# Patient Record
Sex: Female | Born: 1935 | Race: White | Hispanic: No | Smoking: Former smoker
Health system: Southern US, Community
[De-identification: ages and names within clinical notes are randomized; demographics above are authoritative.]

## PROBLEM LIST (undated history)

## (undated) DIAGNOSIS — I10 Essential (primary) hypertension: Secondary | ICD-10-CM

## (undated) DIAGNOSIS — F0390 Unspecified dementia without behavioral disturbance: Secondary | ICD-10-CM

## (undated) DIAGNOSIS — H5440 Blindness, one eye, unspecified eye: Secondary | ICD-10-CM

## (undated) DIAGNOSIS — C801 Malignant (primary) neoplasm, unspecified: Secondary | ICD-10-CM

## (undated) DIAGNOSIS — H409 Unspecified glaucoma: Secondary | ICD-10-CM

## (undated) DIAGNOSIS — I639 Cerebral infarction, unspecified: Secondary | ICD-10-CM

## (undated) HISTORY — DX: Cerebral infarction, unspecified: I63.9

## (undated) HISTORY — DX: Essential (primary) hypertension: I10

## (undated) HISTORY — PX: APPENDECTOMY: SHX54

## (undated) HISTORY — DX: Malignant (primary) neoplasm, unspecified: C80.1

## (undated) HISTORY — DX: Blindness, one eye, unspecified eye: H54.40

## (undated) HISTORY — PX: ABDOMINAL HYSTERECTOMY: SHX81

## (undated) HISTORY — PX: EYE SURGERY: SHX253

## (undated) HISTORY — DX: Unspecified glaucoma: H40.9

---

## 2004-05-07 ENCOUNTER — Inpatient Hospital Stay: Payer: Self-pay | Admitting: Surgery

## 2004-05-07 ENCOUNTER — Other Ambulatory Visit: Payer: Self-pay

## 2004-05-24 ENCOUNTER — Ambulatory Visit: Payer: Self-pay | Admitting: Surgery

## 2004-12-27 ENCOUNTER — Ambulatory Visit: Payer: Self-pay | Admitting: Internal Medicine

## 2005-10-01 ENCOUNTER — Ambulatory Visit: Payer: Self-pay | Admitting: Orthopaedic Surgery

## 2006-01-07 ENCOUNTER — Ambulatory Visit: Payer: Self-pay | Admitting: Internal Medicine

## 2006-04-11 ENCOUNTER — Ambulatory Visit: Payer: Self-pay | Admitting: Ophthalmology

## 2006-04-23 ENCOUNTER — Ambulatory Visit: Payer: Self-pay | Admitting: Ophthalmology

## 2006-05-28 ENCOUNTER — Ambulatory Visit: Payer: Self-pay | Admitting: Surgery

## 2006-05-28 ENCOUNTER — Other Ambulatory Visit: Payer: Self-pay

## 2006-06-05 ENCOUNTER — Ambulatory Visit: Payer: Self-pay | Admitting: Surgery

## 2007-01-09 ENCOUNTER — Ambulatory Visit: Payer: Self-pay | Admitting: Internal Medicine

## 2007-09-19 ENCOUNTER — Other Ambulatory Visit: Payer: Self-pay

## 2007-09-19 ENCOUNTER — Emergency Department: Payer: Self-pay | Admitting: Emergency Medicine

## 2007-10-19 ENCOUNTER — Emergency Department: Payer: Self-pay | Admitting: Emergency Medicine

## 2007-10-19 ENCOUNTER — Other Ambulatory Visit: Payer: Self-pay

## 2008-01-11 ENCOUNTER — Ambulatory Visit: Payer: Self-pay | Admitting: Internal Medicine

## 2008-04-16 ENCOUNTER — Other Ambulatory Visit: Payer: Self-pay

## 2008-04-16 ENCOUNTER — Observation Stay: Payer: Self-pay | Admitting: Unknown Physician Specialty

## 2008-06-15 ENCOUNTER — Ambulatory Visit: Payer: Self-pay | Admitting: Ophthalmology

## 2008-06-22 ENCOUNTER — Ambulatory Visit: Payer: Self-pay | Admitting: Ophthalmology

## 2009-01-12 ENCOUNTER — Ambulatory Visit: Payer: Self-pay | Admitting: Internal Medicine

## 2009-05-24 ENCOUNTER — Ambulatory Visit: Payer: Self-pay | Admitting: Unknown Physician Specialty

## 2010-01-09 ENCOUNTER — Ambulatory Visit: Payer: Self-pay | Admitting: Ophthalmology

## 2010-01-15 ENCOUNTER — Ambulatory Visit: Payer: Self-pay | Admitting: Ophthalmology

## 2010-02-02 ENCOUNTER — Ambulatory Visit: Payer: Self-pay | Admitting: Ophthalmology

## 2010-04-11 ENCOUNTER — Ambulatory Visit: Payer: Self-pay | Admitting: Internal Medicine

## 2010-08-03 ENCOUNTER — Emergency Department: Payer: Self-pay | Admitting: Emergency Medicine

## 2011-04-15 ENCOUNTER — Ambulatory Visit: Payer: Self-pay | Admitting: Internal Medicine

## 2012-01-07 ENCOUNTER — Ambulatory Visit: Payer: Self-pay | Admitting: Internal Medicine

## 2012-04-15 ENCOUNTER — Ambulatory Visit: Payer: Self-pay | Admitting: Internal Medicine

## 2013-04-16 ENCOUNTER — Ambulatory Visit: Payer: Self-pay | Admitting: Internal Medicine

## 2013-10-18 ENCOUNTER — Emergency Department: Payer: Self-pay | Admitting: Emergency Medicine

## 2013-10-18 LAB — CBC
HCT: 37.4 % (ref 35.0–47.0)
HGB: 12.4 g/dL (ref 12.0–16.0)
MCH: 30.4 pg (ref 26.0–34.0)
MCHC: 33 g/dL (ref 32.0–36.0)
MCV: 92 fL (ref 80–100)
PLATELETS: 285 10*3/uL (ref 150–440)
RBC: 4.06 10*6/uL (ref 3.80–5.20)
RDW: 13.9 % (ref 11.5–14.5)
WBC: 7 10*3/uL (ref 3.6–11.0)

## 2013-10-18 LAB — COMPREHENSIVE METABOLIC PANEL
ALBUMIN: 4 g/dL (ref 3.4–5.0)
ANION GAP: 3 — AB (ref 7–16)
Alkaline Phosphatase: 58 U/L
BILIRUBIN TOTAL: 0.7 mg/dL (ref 0.2–1.0)
BUN: 12 mg/dL (ref 7–18)
CO2: 29 mmol/L (ref 21–32)
Calcium, Total: 8.7 mg/dL (ref 8.5–10.1)
Chloride: 100 mmol/L (ref 98–107)
Creatinine: 0.83 mg/dL (ref 0.60–1.30)
EGFR (African American): >60
Glucose: 128 mg/dL — ABNORMAL HIGH (ref 65–99)
Osmolality: 266 (ref 275–301)
Potassium: 4.1 mmol/L (ref 3.5–5.1)
SGOT(AST): 29 U/L (ref 15–37)
SGPT (ALT): 21 U/L (ref 12–78)
SODIUM: 132 mmol/L — AB (ref 136–145)
TOTAL PROTEIN: 8.2 g/dL (ref 6.4–8.2)

## 2013-10-18 LAB — PROTIME-INR
INR: 1
PROTHROMBIN TIME: 13.3 s (ref 11.5–14.7)

## 2013-10-18 LAB — CK TOTAL AND CKMB (NOT AT ARMC)
CK, Total: 86 U/L
CK-MB: 0.9 ng/mL (ref 0.5–3.6)

## 2013-10-18 LAB — APTT: Activated PTT: 32.2 secs (ref 23.6–35.9)

## 2013-10-18 LAB — TROPONIN I: Troponin-I: <0.02 ng/mL

## 2014-04-18 ENCOUNTER — Ambulatory Visit: Payer: Self-pay | Admitting: Internal Medicine

## 2014-07-13 ENCOUNTER — Inpatient Hospital Stay: Payer: Self-pay | Admitting: Internal Medicine

## 2014-07-13 LAB — URINALYSIS, COMPLETE
BILIRUBIN, UR: NEGATIVE
Bacteria: NONE SEEN
Blood: NEGATIVE
Glucose,UR: NEGATIVE mg/dL (ref 0–75)
Ketone: NEGATIVE
Leukocyte Esterase: NEGATIVE
Nitrite: NEGATIVE
Ph: 7 (ref 4.5–8.0)
Protein: NEGATIVE
RBC,UR: <1 /HPF (ref 0–5)
SPECIFIC GRAVITY: 1.005 (ref 1.003–1.030)
WBC UR: 1 /HPF (ref 0–5)

## 2014-07-13 LAB — BASIC METABOLIC PANEL
Anion Gap: 9 (ref 7–16)
BUN: 13 mg/dL (ref 7–18)
CO2: 27 mmol/L (ref 21–32)
CREATININE: 0.67 mg/dL (ref 0.60–1.30)
Calcium, Total: 9.5 mg/dL (ref 8.5–10.1)
Chloride: 100 mmol/L (ref 98–107)
EGFR (African American): >60
EGFR (Non-African Amer.): >60
GLUCOSE: 87 mg/dL (ref 65–99)
OSMOLALITY: 271 (ref 275–301)
POTASSIUM: 4.2 mmol/L (ref 3.5–5.1)
SODIUM: 136 mmol/L (ref 136–145)

## 2014-07-13 LAB — CBC WITH DIFFERENTIAL/PLATELET
Basophil #: 0 10*3/uL (ref 0.0–0.1)
Basophil %: 0.9 %
Eosinophil #: 0.1 10*3/uL (ref 0.0–0.7)
Eosinophil %: 1.8 %
HCT: 38.3 % (ref 35.0–47.0)
HGB: 12.7 g/dL (ref 12.0–16.0)
LYMPHS PCT: 33.5 %
Lymphocyte #: 1.4 10*3/uL (ref 1.0–3.6)
MCH: 30.5 pg (ref 26.0–34.0)
MCHC: 33.2 g/dL (ref 32.0–36.0)
MCV: 92 fL (ref 80–100)
MONOS PCT: 12.7 %
Monocyte #: 0.5 x10 3/mm (ref 0.2–0.9)
NEUTROS PCT: 51.1 %
Neutrophil #: 2.1 10*3/uL (ref 1.4–6.5)
Platelet: 313 10*3/uL (ref 150–440)
RBC: 4.17 10*6/uL (ref 3.80–5.20)
RDW: 14.5 % (ref 11.5–14.5)
WBC: 4.1 10*3/uL (ref 3.6–11.0)

## 2014-07-13 LAB — HEPATIC FUNCTION PANEL A (ARMC)
ALBUMIN: 4 g/dL (ref 3.4–5.0)
ALT: 28 U/L
AST: 41 U/L — AB (ref 15–37)
Alkaline Phosphatase: 74 U/L
Bilirubin, Direct: 0.1 mg/dL (ref 0.0–0.2)
Bilirubin,Total: 0.9 mg/dL (ref 0.2–1.0)
Total Protein: 8.6 g/dL — ABNORMAL HIGH (ref 6.4–8.2)

## 2014-07-14 LAB — CBC WITH DIFFERENTIAL/PLATELET
BASOS ABS: 0 10*3/uL (ref 0.0–0.1)
BASOS PCT: 0.7 %
Eosinophil #: 0.1 10*3/uL (ref 0.0–0.7)
Eosinophil %: 2.7 %
HCT: 36.8 % (ref 35.0–47.0)
HGB: 12.1 g/dL (ref 12.0–16.0)
LYMPHS PCT: 41.9 %
Lymphocyte #: 1.8 10*3/uL (ref 1.0–3.6)
MCH: 30.3 pg (ref 26.0–34.0)
MCHC: 32.8 g/dL (ref 32.0–36.0)
MCV: 92 fL (ref 80–100)
Monocyte #: 0.5 x10 3/mm (ref 0.2–0.9)
Monocyte %: 12.4 %
NEUTROS PCT: 42.3 %
Neutrophil #: 1.9 10*3/uL (ref 1.4–6.5)
PLATELETS: 283 10*3/uL (ref 150–440)
RBC: 3.98 10*6/uL (ref 3.80–5.20)
RDW: 14.4 % (ref 11.5–14.5)
WBC: 4.4 10*3/uL (ref 3.6–11.0)

## 2014-07-14 LAB — LIPID PANEL
Cholesterol: 158 mg/dL (ref 0–200)
HDL Cholesterol: 50 mg/dL (ref 40–60)
Ldl Cholesterol, Calc: 89 mg/dL (ref 0–100)
TRIGLYCERIDES: 95 mg/dL (ref 0–200)
VLDL Cholesterol, Calc: 19 mg/dL (ref 5–40)

## 2014-07-14 LAB — BASIC METABOLIC PANEL
ANION GAP: 5 — AB (ref 7–16)
BUN: 18 mg/dL (ref 7–18)
CALCIUM: 8.5 mg/dL (ref 8.5–10.1)
CREATININE: 0.89 mg/dL (ref 0.60–1.30)
Chloride: 102 mmol/L (ref 98–107)
Co2: 29 mmol/L (ref 21–32)
EGFR (African American): >60
EGFR (Non-African Amer.): >60
Glucose: 91 mg/dL (ref 65–99)
OSMOLALITY: 273 (ref 275–301)
Potassium: 4 mmol/L (ref 3.5–5.1)
Sodium: 136 mmol/L (ref 136–145)

## 2014-07-14 LAB — HEMOGLOBIN A1C: Hemoglobin A1C: 5.5 % (ref 4.2–6.3)

## 2014-07-14 LAB — TSH: Thyroid Stimulating Horm: 3.07 u[IU]/mL

## 2014-07-18 ENCOUNTER — Emergency Department: Payer: Self-pay | Admitting: Emergency Medicine

## 2014-07-18 LAB — CBC WITH DIFFERENTIAL/PLATELET
BASOS ABS: 0 10*3/uL (ref 0.0–0.1)
BASOS PCT: 0.8 %
Eosinophil #: 0.1 10*3/uL (ref 0.0–0.7)
Eosinophil %: 1.7 %
HCT: 39.6 % (ref 35.0–47.0)
HGB: 12.7 g/dL (ref 12.0–16.0)
LYMPHS ABS: 1.4 10*3/uL (ref 1.0–3.6)
Lymphocyte %: 30 %
MCH: 30 pg (ref 26.0–34.0)
MCHC: 32.1 g/dL (ref 32.0–36.0)
MCV: 93 fL (ref 80–100)
Monocyte #: 0.5 x10 3/mm (ref 0.2–0.9)
Monocyte %: 10.7 %
NEUTROS PCT: 56.8 %
Neutrophil #: 2.6 10*3/uL (ref 1.4–6.5)
PLATELETS: 297 10*3/uL (ref 150–440)
RBC: 4.24 10*6/uL (ref 3.80–5.20)
RDW: 14.4 % (ref 11.5–14.5)
WBC: 4.6 10*3/uL (ref 3.6–11.0)

## 2014-07-18 LAB — PROTIME-INR
INR: 1
Prothrombin Time: 13.2 secs (ref 11.5–14.7)

## 2014-07-18 LAB — COMPREHENSIVE METABOLIC PANEL
ALBUMIN: 3.9 g/dL (ref 3.4–5.0)
ALK PHOS: 69 U/L
Anion Gap: 7 (ref 7–16)
BUN: 13 mg/dL (ref 7–18)
Bilirubin,Total: 1 mg/dL (ref 0.2–1.0)
CREATININE: 0.76 mg/dL (ref 0.60–1.30)
Calcium, Total: 8.9 mg/dL (ref 8.5–10.1)
Chloride: 104 mmol/L (ref 98–107)
Co2: 26 mmol/L (ref 21–32)
GLUCOSE: 83 mg/dL (ref 65–99)
OSMOLALITY: 273 (ref 275–301)
Potassium: 4.3 mmol/L (ref 3.5–5.1)
SGOT(AST): 29 U/L (ref 15–37)
SGPT (ALT): 23 U/L
Sodium: 137 mmol/L (ref 136–145)
Total Protein: 8.4 g/dL — ABNORMAL HIGH (ref 6.4–8.2)

## 2014-07-18 LAB — TROPONIN I: Troponin-I: <0.02 ng/mL

## 2014-07-18 LAB — APTT: ACTIVATED PTT: 30.4 s (ref 23.6–35.9)

## 2014-09-28 ENCOUNTER — Emergency Department: Payer: Self-pay | Admitting: Emergency Medicine

## 2014-11-03 ENCOUNTER — Emergency Department
Admit: 2014-11-03 | Disposition: A | Discharge status: Home / Self Care | Payer: Self-pay | Admitting: Emergency Medicine

## 2014-11-03 LAB — BASIC METABOLIC PANEL
Anion Gap: 9 (ref 7–16)
BUN: 20 mg/dL
CREATININE: 0.69 mg/dL
Calcium, Total: 10.1 mg/dL
Chloride: 96 mmol/L — ABNORMAL LOW
Co2: 29 mmol/L
EGFR (African American): >60
EGFR (Non-African Amer.): >60
GLUCOSE: 104 mg/dL — AB
Potassium: 4 mmol/L
Sodium: 134 mmol/L — ABNORMAL LOW

## 2014-11-03 LAB — CBC
HCT: 39.9 % (ref 35.0–47.0)
HGB: 13.2 g/dL (ref 12.0–16.0)
MCH: 30.2 pg (ref 26.0–34.0)
MCHC: 33.1 g/dL (ref 32.0–36.0)
MCV: 91 fL (ref 80–100)
PLATELETS: 266 10*3/uL (ref 150–440)
RBC: 4.37 10*6/uL (ref 3.80–5.20)
RDW: 13.6 % (ref 11.5–14.5)
WBC: 5.6 10*3/uL (ref 3.6–11.0)

## 2014-11-03 LAB — TROPONIN I
Troponin-I: <0.03 ng/mL
Troponin-I: <0.03 ng/mL

## 2014-11-12 NOTE — Discharge Summary (Signed)
PATIENT NAME:  Karen Krueger, Karen Krueger MR#:  [id number] DATE OF BIRTH:  09-Nov-1931  DATE OF ADMISSION:  07/13/2014 DATE OF DISCHARGE:  07/14/2014  DISCHARGE DIAGNOSES: 1.  Transient ischemic attack with facial symptoms only.  2.  hypertensive urgency.  3.  History of stroke.   DISCHARGE MEDICATIONS: Per [facility] med reconciliation. Please see for details. Briefly, will have pravastatin changed to atorvastatin 40 mg, given known vascular disease now needing intensive therapy, and we will add aspirin daily.   HISTORY AND PHYSICAL: Please see detailed history and physical done on admission.   HOSPITAL COURSE: The patient was admitted with the above, symptoms resolved by admission. Work-up was essentially negative. There was concern about a pontine lesion on the CT scan of her head, but it was not on the MRI. Had an unchanged/subtle possible posterior right middle cerebral artery infarct, but again, this is not new. Otherwise, was stable. Blood pressure was great. She will be discharged home with an early follow-up with Dr. Doy Hutching.   ____________________________ Ocie Cornfield. Ouida Sills, MD mwa:mw D: 07/14/2014 11:30:59 ET T: 07/14/2014 12:49:02 ET JOB#: [id number]  cc: Ocie Cornfield. Ouida Sills, MD, <Dictator> Kirk Ruths MD ELECTRONICALLY SIGNED 07/16/2014 9:27

## 2014-11-12 NOTE — Consult Note (Signed)
PATIENT NAME:  Karen Krueger, Karen Krueger MR#:  [id number] DATE OF BIRTH:  Dec 04, 1931  DATE OF CONSULTATION:  07/14/2014  REFERRING PHYSICIAN:   CONSULTING PHYSICIAN:  Leotis Pain, MD  REASON FOR CONSULTATION: Headache, rule out stroke.   HISTORY OF PRESENT ILLNESS: This is an 81 year old female with past medical history of hypertension, hyperlipidemia, and glaucoma, presented with diffuse headache, as well as blurry vision. On admission, blood pressure 220/96. The patient has a chronic stroke on the right thalamic region.  The patient status post CAT scan and MRI of the head, did not show any acute intracranial abnormalities and currently back to baseline with blood pressure control.   PAST MEDICAL HISTORY: Hypertension, osteoarthritis,  hyperlipidemia, glaucoma.   PAST SURGICAL HISTORY: Hysterectomy, breast cyst removal, appendectomy, hernia repair.   SOCIAL HISTORY: Lives alone.   FAMILY HISTORY: Positive for coronary artery disease.   REVIEW OF SYSTEMS:  RESPIRATORY:  No shortness of breath.  CARDIOVASCULAR: No chest pain.  GASTROINTESTINAL:  No abdominal pain.  NEUROLOGICAL: No heat or cold intolerance. No weakness on one side of the body compared to the other.   HOME MEDICATIONS: Have been reviewed.   LABORATORY DATA: Work-up has been reviewed.   IMAGING STUDIES: As above.   NEUROLOGICAL EVALUATION: The patient is alert, awake, oriented x3. Extraocular movements intact. Facial sensation intact. Facial motor is intact. Tongue is midline. Uvula elevates symmetrically. Shoulder shrug intact. Motor 5 out of 5 bilaterally. Sensation intact: Finger-to-nose intact. Gait not examined.   IMPRESSION: An 81 year old female presenting with blurred vision in the setting of elevated blood pressure of 220/96, likely hypertensive urgency. Symptoms have resolved. The patient is currently back to baseline. Blood pressure improved. Imaging: No acute intracranial abnormalities. The patient has chronic  thalamic infarct.   PLAN: The patient should be on antiplatelet therapy at home. I do not think she needs any further imaging or work-up from a neurological prospective, safe to discharge from a neurological prospective. Please call me with any questions    ____________________________ Leotis Pain, MD yz:mw D: 07/14/2014 13:56:29 ET T: 07/14/2014 15:14:33 ET JOB#: [id number]  cc: Leotis Pain, MD, <Dictator> Leotis Pain MD ELECTRONICALLY SIGNED 07/20/2014 13:50

## 2014-11-12 NOTE — H&P (Signed)
PATIENT NAME:  Karen Krueger, Karen Krueger MR#:  [id number] DATE OF BIRTH:  12-26-1931  DATE OF ADMISSION:  07/13/2014  REFERRING EMERGENCY ROOM PHYSICIAN: Larae Grooms, MD  PRIMARY CARE PHYSICIAN: Leonie Douglas. Doy Hutching, MD   CHIEF COMPLAINT: Headache and hypertension.   HISTORY OF PRESENT ILLNESS: This 81 year old woman with past medical history of hypertension, hyperlipidemia and glaucoma presents today from home with complaint of headache and high blood pressure. She reports that upon waking she had a pounding headache, take her blood pressure and found it to be 220/96. She also had sensation of right upper lip trembling and numbness, vision blurring. Her family is present and they report that she was somewhat confused and slow to form words during that time. She called EMS and presented to the Emergency Room. Symptoms have now resolved. Initial CT shows a small hyperdense lesion in the right thalamus, nonspecific and likely chronic. She is evaluated for further evaluation for possible CVA versus TIA.   PAST MEDICAL HISTORY:  1.  Hyperlipidemia.  2.  Osteoarthritis.  3.  Hypertension.  4.  Glaucoma.   PAST SURGICAL HISTORY:  1.  Hysterectomy.  2.  Breast cyst removal.  3.  Appendectomy.  4.  Hernia repair. 5.  Lens implant in the left eye.   SOCIAL HISTORY: The patient lives alone. She does not smoke cigarettes or drink alcohol. She does not use a walker or use any home oxygen.   FAMILY MEDICAL HISTORY: Positive for coronary artery disease in her son. No history of CVAs. No history of breast or colon cancer in the family.   ALLERGIES: THE PATIENT IS ALLERGIC TO COSOPT, CODEINE, SULFA DRUGS AND BETIMOL.   HOME MEDICATIONS:  1.  Verapamil 180 mg every 12 hours.  2.  Pravastatin 20 mg 1 tablet daily.  3.  Durezol 0.05% ophthalmic emulsion, 1 drop to each eye 4 times a day.  4.  Azopt 1% ophthalmic suspension 1 drop to each effected eye 3 times a day.  5.  Acetaminophen 500 mg 1 tablet once a  day at bedtime for pain.  6.  Prednisolone 5 mg ophthalmic solution 1 drop into the left eye 3 times a day.   REVIEW OF SYSTEMS:  CONSTITUTIONAL: Negative for fevers, chills, change in weight or weakness.  HEENT: Positive for blurred vision. She is blind in the left eye. No change in hearing. No sinus discharge. No sore throat or difficulty swallowing.  RESPIRATORY: No cough, wheezing, hemoptysis, dyspnea, orthopnea, painful respirations or COPD.  CARDIOVASCULAR: Positive for palpitations. No orthopnea, edema or arrhythmia. No syncope.  GASTROINTESTINAL: No nausea, vomiting, diarrhea or abdominal pain.  GENITOURINARY: Negative for dysuria or frequency.  MUSCULOSKELETAL: No new pain in the neck, back, shoulders or knees. No weakness or joint swelling.  NEUROLOGIC: Positive for sensation of numbness over the right upper lip. No other focal numbness or weakness. Positive for confusion with word finding difficulty this morning. No other signs of dementia. No seizure.  PSYCHIATRIC: No anxiety, depression, bipolar disorder, seizure disorder.   PHYSICAL EXAMINATION:  VITAL SIGNS: Temperature 97.4, pulse 67, respirations 18, blood pressure 144/77, oxygenation 98% on room air.  GENERAL: No acute distress.  HEENT: Right pupil is reactive, left is not. Conjunctivae are clear. Extraocular motion intact. Mucous membranes pink and moist. Good dentition. No cervical lymphadenopathy. Trachea is midline, thyroid nontender.  RESPIRATORY: Lungs clear to auscultation bilaterally with good air movement.  CARDIOVASCULAR: Regular rate and rhythm. no murmurs, rubs or gallops. No peripheral edema. Peripheral  pulses are 2+.  ABDOMEN: Soft, nontender, nondistended. Bowel sounds normal. No guarding, rebound or hepatosplenomegaly.  MUSCULOSKELETAL: No joint effusions. Range of motion is normal. Strength is 5/5 throughout.  NEUROLOGIC: Cranial nerves II-XII are intact. Strength and sensation are intact.  PSYCHIATRIC: The  patient alert and oriented with good insight into her clinical condition. No dysarthria or confusion at this time.  LABORATORY DATA: Sodium 136, potassium 4.2, chloride 100, bicarbonate 27, BUN 13, creatinine 0.67, glucose 87. White blood cells 4.1, hemoglobin 12.7, hematocrit 38.3, platelets 313,000, MCV 92. UA negative for signs of infection.   IMAGING: CT scan of the head without contrast shows small hypodense lesion anteriorly in the right thalamus, nonspecific and likely chronic. This was not visible on prior examination in 2009. Cannot completely exclude the possibility of subacute lacunar infarct at this site. MRI would be useful for further workup. No finding of intracranial hemorrhage or mass lesion. Periventricular white matter and corona radiata hypodensities favor chronic ischemic microvascular white matter disease.   ASSESSMENT AND PLAN: 1.  Transient ischemic attack versus cerebrovascular accident: CT findings show what is likely a chronic lesion. We will get an MRI. We will admit to observation on telemetry. We will get a 2D echocardiogram and carotid Dopplers. We will check lipids, A1c and a TSH. We will start a statin and aspirin. We will get a neurology and physical therapy consultation.  2.  Hypertensive urgency: The patient's hypertension has resolved now on her home medications. I will continue her home medication regimen and continue to monitor. She did have a headache with some neurologic symptoms with very elevated blood pressure.  3.  Hyperlipidemia: Continue statin.  4.  Prophylaxis: Heparin for deep venous thrombosis prophylaxis.   DISPOSITION: The patient would like to be a full code.   TIME SPENT ON ADMISSION: Forty minutes.     ____________________________ Earleen Newport. Volanda Napoleon, MD cpw:TT D: 07/13/2014 15:55:18 ET T: 07/13/2014 16:19:11 ET JOB#: [id number]  cc: Barnetta Chapel P. Volanda Napoleon, MD, <Dictator> Aldean Jewett MD ELECTRONICALLY SIGNED 07/21/2014 23:32

## 2014-11-23 ENCOUNTER — Ambulatory Visit: Payer: Self-pay | Admitting: Cardiovascular Disease

## 2015-04-24 ENCOUNTER — Other Ambulatory Visit
Admission: RE | Admit: 2015-04-24 | Discharge: 2015-04-24 | Disposition: A | Discharge status: Home / Self Care | Payer: Commercial Managed Care - HMO | Source: Ambulatory Visit | Attending: Ophthalmology | Admitting: Ophthalmology

## 2015-04-24 DIAGNOSIS — H16032 Corneal ulcer with hypopyon, left eye: Secondary | ICD-10-CM | POA: Insufficient documentation

## 2015-04-27 LAB — EYE CULTURE

## 2015-05-08 ENCOUNTER — Encounter: Admission: RE | Payer: Self-pay | Source: Ambulatory Visit

## 2015-05-08 ENCOUNTER — Ambulatory Visit: Admission: RE | Admit: 2015-05-08 | Payer: Self-pay | Source: Ambulatory Visit | Admitting: Ophthalmology

## 2015-05-08 SURGERY — INSERTION, GLAUCOMA VALVE, AHMED
Anesthesia: Topical | Laterality: Right

## 2015-05-16 LAB — CULTURE, FUNGUS WITHOUT SMEAR

## 2016-02-20 ENCOUNTER — Encounter: Payer: Self-pay | Admitting: Physical Therapy

## 2016-02-20 ENCOUNTER — Ambulatory Visit: Payer: Medicare Other | Attending: Neurology | Admitting: Physical Therapy

## 2016-02-20 DIAGNOSIS — R262 Difficulty in walking, not elsewhere classified: Secondary | ICD-10-CM | POA: Diagnosis present

## 2016-02-20 DIAGNOSIS — M6281 Muscle weakness (generalized): Secondary | ICD-10-CM | POA: Insufficient documentation

## 2016-02-20 NOTE — Therapy (Signed)
[place] [facility] SERVICES [address] [place], [place], [address] Phone: [phone]   Fax:  [phone]  Physical Therapy Evaluation  Patient Details  Name: Karen Krueger MRN: [id number] Date of Birth: 1932/04/10 Referring Provider: Dr. Manuella Ghazi   Encounter Date: 02/20/2016      PT End of Session - 02/20/16 1112    Visit Number 1   Number of Visits 1   Date for PT Re-Evaluation 02/20/16   Authorization Type gcode 1    PT Start Time 1022   PT Stop Time 1104   PT Time Calculation (min) 42 min   Equipment Utilized During Treatment Gait belt   Activity Tolerance Patient tolerated treatment well   Behavior During Therapy [facility] for tasks assessed/performed      Past Medical History:  Diagnosis Date  . Blind left eye   . Cancer ([place])    previous skin cancer   . Glaucoma    blind in Left eye   . Hypertension    controlled with medication   . Stroke [facility])    two previous strokes, 2005, 2011     Past Surgical History:  Procedure Laterality Date  . ABDOMINAL HYSTERECTOMY    . APPENDECTOMY    . EYE SURGERY     glucacoma numerous times     There were no vitals filed for this visit.       Subjective Assessment - 02/20/16 1039    Subjective Pt reports that Dr. Manuella Ghazi referred her to physical therapy so that she can "exercise her mind".  Pt reports that she does not exercise much at home rather than house work.  Pt does not believe she has many difficulties and that she can take care of herself well.  She did not bring medication list nor was she able to recall them, advised to bring next session. She is accompanied by her daughter who reports she is taking care of her husband and would have difficulty driving her to [place].     Patient is accompained by: Family member   Pertinent History Personal factors affecting rehab: blind in L eye, partial blindenss in R eye, age, mixed vascular dementia diagnosis    Limitations Walking;Lifting    How long can you sit comfortably? n/a    How long can you stand comfortably? 30 minutes    How long can you walk comfortably? 300 + feet    Diagnostic tests n/a    Patient Stated Goals no reported goals    Currently in Pain? No/denies            [facility] PT Assessment - 02/20/16 0001      Assessment   Medical Diagnosis Mixed Vascular Dementia    Referring Provider Dr. Katharine Look Dominance Right   Next MD Visit 03/17/16   Prior Therapy n/a      Precautions   Precautions Fall     Balance Screen   Has the patient fallen in the past 6 months Yes   How many times? 2   fell out of bed    Has the patient had a decrease in activity level because of a fear of falling?  No   Is the patient reluctant to leave their home because of a fear of falling?  No     Home Environment   Additional Comments lives with 2 sons, 1 story, no steps to get inside, no grab, tub shower  Prior Function   Level of Independence Independent with basic ADLs;Needs assistance with homemaking  administration of medication, chores, no AD, some homemaking   Vocation Retired   Medical illustrator, go out to eat, singing,      Cognition   Overall Cognitive Status History of cognitive impairments - at baseline  diagosis of mixed vascular dementia    Attention Focused   Memory Impaired  difficulty recalling dates of old strokes, doctors appt. etc     Observation/Other Assessments   Observations pt is pleasant slightly confused elderly lady accompanied by her daughter      Sensation   Light Touch Appears Intact   Additional Comments RAMPS-decreased ability to follow instruction to continue flipping hands      Coordination   Fine Motor Movements are Fluid and Coordinated No   Finger Nose Finger Test slight dysmetria with BUEs, could be due to loss of vision      Posture/Postural Control   Posture Comments increased thoracic kyphosis, forward head, rolled shoulders     Strength   Right Hip Flexion  4/5   Right Hip ABduction 4+/5   Right Hip ADduction 4+/5   Left Hip Flexion 4/5   Left Hip ABduction 4+/5   Left Hip ADduction 4+/5   Right Knee Flexion 4+/5   Right Knee Extension 4+/5   Left Knee Flexion 4+/5   Left Knee Extension 4+/5   Right Ankle Dorsiflexion 4+/5   Left Ankle Dorsiflexion 4+/5     Transfers   Five time sit to stand comments  12.69  <15 seconds indicates decreased fall risk      Ambulation/Gait   Gait Comments ambulates without AD or assistance, sways laterally without noticing during ambulation, decreased hip extension and arm swing      Standardized Balance Assessment   10 Meter Walk 0.32m/s  >1.18m/s indicates full community ambulator      High Level Balance   High Level Balance Comments >30 seconds feet together, >30 seconds eyes closed feet apart, unable to perform tandem stance              Tens Evaluation [facility]) - 02/20/16 0904      TENS History   History of Current Condition                                                                                       Assessed strength, general ROM-WFL 38m walk test-0.29m/s indicating limited community Ambulator  5 time sit to stand 12.69s, <15 seconds indicates decreased falls risk  Educated on the benefits of physical therapy and plan of care.  At this time pt was not interested in pursuing physical therapy due to difficulties with transportation and beliefs that she does not need the services.                   PT Education - 02/20/16 1110    Education provided Yes   Education Details plan of care, purpose of balance training and physical therapy    Person(s) Educated Patient;Child(ren)  adult child   Methods Explanation;Verbal cues   Comprehension Verbalized understanding  PT Long Term Goals - 02/20/16 1254      PT LONG TERM GOAL #1   Title Pt will be independent in safety regarding ADLs    Time 1   Period Days   Status Achieved                Plan - 02/20/16 1254    Clinical Impression Statement Pt is friendly 82 year old woman accompanied to therapy by her daughter.  Pt presents slightly confused with a diagnosis of mixed vascular dementia.  She was unable to understand why she was referred to physical therapy by her MD.  Pt presents to PT with gross 4+/5 LE strength.  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  During ambulation pt will sway laterally or change speed without realizing.  Her static balance was [facility] but dynamic balance is challenged due to blindness in L eye and mixed vascular dementia.  She was provided the benefits and plan of care involved with physical therapy.  Due to her upcoming eye surgery for her R eye she is not interested in pursuing physical therapy right now.  She was offered to only come once a week but deals with transportation issues and doesnt want to burden anyone to bring her.  Educated pt that if she changes her mind and would like to pursue physical therapy to give [place] a call.     Rehab Potential Good   Clinical Impairments Affecting Rehab Potential Positive: PLOF, ambulatory    Negative: memory deficits, prior strokes, transportation   Clinical Impression: evolving due to blindness in L eye, upcoming eye surgery for R eye and progressive vascualr dementia    PT Frequency 2x / week   PT Duration 6 weeks   PT Treatment/Interventions Patient/family education;Therapeutic exercise;Functional mobility training   Consulted and Agree with Plan of Care Patient;Family member/caregiver  daughter present       Patient will benefit from skilled therapeutic intervention in order to improve the following deficits and impairments:  Decreased balance, Decreased endurance, Decreased activity tolerance, Difficulty walking, Improper body mechanics, Decreased cognition, Decreased mobility, Decreased  safety awareness, Decreased strength  Visit Diagnosis: Difficulty in walking, not elsewhere classified - Plan: PT plan of care cert/re-cert  Muscle weakness (generalized) - Plan: PT plan of care cert/re-cert      G-Codes - [id number]    Functional Assessment Tool Used 10 meter, 5 times sit<>Stand, clinical judgement   Functional Limitation Mobility: Walking and moving around   Mobility: Walking and Moving Around Current Status [phone]) At least 20 percent but less than 40 percent impaired, limited or restricted   Mobility: Walking and Moving Around Goal Status [phone]) At least 20 percent but less than 40 percent impaired, limited or restricted   Mobility: Walking and Moving Around Discharge Status [phone]) At least 20 percent but less than 40 percent impaired, limited or restricted       Problem List There are no active problems to display for this patient.  Stacy Gardner, SPT  This entire session was performed under direct supervision and direction of a licensed therapist/therapist assistant . I have personally read, edited and approve of the note as written.  Trotter,Margaret PT, DPT 02/20/2016, 3:02 PM  [place] [facility] SERVICES [address] [place], [place], [address] Phone: [phone]   Fax:  [phone]  Name: Contessa Ursin MRN: [id number] Date of Birth: Feb 21, 1932

## 2016-02-22 ENCOUNTER — Ambulatory Visit: Payer: Medicare Other | Admitting: Physical Therapy

## 2016-02-26 ENCOUNTER — Ambulatory Visit: Payer: Medicare Other

## 2016-02-28 ENCOUNTER — Ambulatory Visit: Payer: Medicare Other

## 2016-03-05 ENCOUNTER — Ambulatory Visit: Payer: Medicare Other | Admitting: Physical Therapy

## 2016-03-06 ENCOUNTER — Ambulatory Visit: Payer: Medicare Other | Admitting: Physical Therapy

## 2016-03-11 ENCOUNTER — Ambulatory Visit: Payer: Medicare Other | Admitting: Physical Therapy

## 2016-03-18 ENCOUNTER — Ambulatory Visit: Payer: Medicare Other | Admitting: Physical Therapy

## 2016-03-20 ENCOUNTER — Ambulatory Visit: Payer: Medicare Other | Admitting: Physical Therapy

## 2016-03-26 ENCOUNTER — Ambulatory Visit: Payer: Medicare Other | Admitting: Physical Therapy

## 2016-03-28 ENCOUNTER — Ambulatory Visit: Payer: Medicare Other | Admitting: Physical Therapy

## 2016-04-02 ENCOUNTER — Ambulatory Visit: Payer: Medicare Other | Admitting: Physical Therapy

## 2016-04-04 ENCOUNTER — Ambulatory Visit: Payer: Medicare Other | Admitting: Physical Therapy

## 2016-04-09 ENCOUNTER — Ambulatory Visit: Payer: Medicare Other | Admitting: Physical Therapy

## 2016-04-11 ENCOUNTER — Ambulatory Visit: Payer: Medicare Other | Admitting: Physical Therapy

## 2016-04-16 ENCOUNTER — Ambulatory Visit: Payer: Medicare Other | Admitting: Physical Therapy

## 2016-04-18 ENCOUNTER — Ambulatory Visit: Payer: Medicare Other | Admitting: Physical Therapy

## 2016-04-25 ENCOUNTER — Encounter: Payer: Self-pay | Admitting: *Deleted

## 2016-04-25 NOTE — Discharge Instructions (Signed)

## 2016-05-06 ENCOUNTER — Encounter
Admission: RE | Disposition: A | Discharge status: Home / Self Care | Payer: Self-pay | Source: Ambulatory Visit | Attending: Ophthalmology

## 2016-05-06 ENCOUNTER — Ambulatory Visit
Admission: RE | Admit: 2016-05-06 | Discharge: 2016-05-06 | Disposition: A | Discharge status: Home / Self Care | Payer: Medicare Other | Source: Ambulatory Visit | Attending: Ophthalmology | Admitting: Ophthalmology

## 2016-05-06 ENCOUNTER — Ambulatory Visit: Payer: Medicare Other | Admitting: Student in an Organized Health Care Education/Training Program

## 2016-05-06 DIAGNOSIS — I10 Essential (primary) hypertension: Secondary | ICD-10-CM | POA: Diagnosis not present

## 2016-05-06 DIAGNOSIS — H401113 Primary open-angle glaucoma, right eye, severe stage: Secondary | ICD-10-CM | POA: Diagnosis not present

## 2016-05-06 DIAGNOSIS — F0390 Unspecified dementia without behavioral disturbance: Secondary | ICD-10-CM | POA: Diagnosis not present

## 2016-05-06 DIAGNOSIS — Z79899 Other long term (current) drug therapy: Secondary | ICD-10-CM | POA: Insufficient documentation

## 2016-05-06 DIAGNOSIS — E7800 Pure hypercholesterolemia, unspecified: Secondary | ICD-10-CM | POA: Insufficient documentation

## 2016-05-06 DIAGNOSIS — Z8673 Personal history of transient ischemic attack (TIA), and cerebral infarction without residual deficits: Secondary | ICD-10-CM | POA: Diagnosis not present

## 2016-05-06 HISTORY — PX: AQUEOUS SHUNT: SHX6305

## 2016-05-06 SURGERY — INSERTION, AQUEOUS SHUNT, EYE
Anesthesia: Monitor Anesthesia Care | Site: Eye | Laterality: Right | Wound class: Clean

## 2016-05-06 MED ORDER — LACTATED RINGERS IV SOLN: INTRAVENOUS | Status: DC

## 2016-05-06 MED ORDER — ACETAMINOPHEN 160 MG/5ML PO SOLN: 325.0000 mg | ORAL | Status: DC | PRN

## 2016-05-06 MED ORDER — ATROPINE SULFATE 1 % OP SOLN
OPHTHALMIC | Status: DC | PRN
  Administered 2016-05-06: 1 [drp] via OPHTHALMIC

## 2016-05-06 MED ORDER — LIDOCAINE HCL (PF) 4 % IJ SOLN
INTRAMUSCULAR | Status: DC | PRN
  Administered 2016-05-06: 2 mL via OPHTHALMIC

## 2016-05-06 MED ORDER — ACETAMINOPHEN 325 MG PO TABS: 325.0000 mg | ORAL_TABLET | ORAL | Status: DC | PRN

## 2016-05-06 MED ORDER — FENTANYL CITRATE (PF) 100 MCG/2ML IJ SOLN
INTRAMUSCULAR | Status: DC | PRN
  Administered 2016-05-06: 50 ug via INTRAVENOUS

## 2016-05-06 MED ORDER — MOXIFLOXACIN HCL 0.5 % OP SOLN
1.0000 [drp] | OPHTHALMIC | Status: DC | PRN
  Administered 2016-05-06 (×3): 1 [drp] via OPHTHALMIC

## 2016-05-06 MED ORDER — NEOMYCIN-POLYMYXIN-DEXAMETH 3.5-10000-0.1 OP OINT
TOPICAL_OINTMENT | OPHTHALMIC | Status: DC | PRN
  Administered 2016-05-06: 1 via OPHTHALMIC

## 2016-05-06 MED ORDER — MOXIFLOXACIN HCL 0.5 % OP SOLN: 1.0000 [drp] | OPHTHALMIC | Status: DC | PRN

## 2016-05-06 MED ORDER — MIDAZOLAM HCL 2 MG/2ML IJ SOLN
INTRAMUSCULAR | Status: DC | PRN
  Administered 2016-05-06: 1.5 mg via INTRAVENOUS

## 2016-05-06 MED ORDER — ARMC OPHTHALMIC DILATING DROPS: 1.0000 "application " | OPHTHALMIC | Status: DC | PRN

## 2016-05-06 MED ORDER — TETRACAINE HCL 0.5 % OP SOLN
OPHTHALMIC | Status: DC | PRN
  Administered 2016-05-06: 2 [drp] via OPHTHALMIC

## 2016-05-06 SURGICAL SUPPLY — 38 items
ALLOGRAFT TUTOPLST SCER0.5X1.0 (Tissue) ×1 IMPLANT
APPLICATOR COTTON TIP 3IN (MISCELLANEOUS) ×3 IMPLANT
BANDAGE EYE OVAL (MISCELLANEOUS) ×6 IMPLANT
BLADE SCLEROTME MULTI-SIDE (MISCELLANEOUS) IMPLANT
CANNULA ANT/CHMB 27GA (MISCELLANEOUS) ×6 IMPLANT
CORD BIP STRL DISP 12FT (MISCELLANEOUS) ×3 IMPLANT
CUP MEDICINE 2OZ PLAST GRAD ST (MISCELLANEOUS) ×3 IMPLANT
ERASER TAPRD BLUNT STR 20-23GA (MISCELLANEOUS) ×1 IMPLANT
GLOVE BIO SURGEON STRL SZ7 (GLOVE) ×3 IMPLANT
GLOVE SURG LX 6.5 MICRO (GLOVE) ×2
GLOVE SURG LX STRL 6.5 MICRO (GLOVE) ×1 IMPLANT
GOWN STRL REUS W/ TWL LRG LVL3 (GOWN DISPOSABLE) ×2 IMPLANT
GOWN STRL REUS W/TWL LRG LVL3 (GOWN DISPOSABLE) ×4
KNIFE OPTIMUM SIDEPORT 15DEG (MISCELLANEOUS) IMPLANT
KNIFE SIDECUT EYE (MISCELLANEOUS) ×3 IMPLANT
MARKER SKIN DUAL TIP RULER LAB (MISCELLANEOUS) ×3 IMPLANT
NDL SAFETY 22GX1.5 (NEEDLE) ×3 IMPLANT
NEEDLE FILTER BLUNT 18X 1/2SAF (NEEDLE) ×4
NEEDLE FILTER BLUNT 18X1 1/2 (NEEDLE) ×2 IMPLANT
NEEDLE HYPO 30X.5 LL (NEEDLE) IMPLANT
PACK EYE AFTER SURG (MISCELLANEOUS) ×3 IMPLANT
PACK OPTHALMIC (MISCELLANEOUS) ×3 IMPLANT
PROTECTOR LASIK FLAP (MISCELLANEOUS) ×3 IMPLANT
SOL BAL SALT 15ML (MISCELLANEOUS) ×6
SOLUTION BAL SALT 15ML (MISCELLANEOUS) ×2 IMPLANT
SPONGE SURG I SPEAR (MISCELLANEOUS) ×9 IMPLANT
SUT ETHILON 10-0 CS-B-6CS-B-6 (SUTURE)
SUT ETHILON 8 0 TG100 8 (SUTURE) ×3 IMPLANT
SUT VICRYL 7 0 TG140 8 (SUTURE) ×3 IMPLANT
SUT VICRYL 8 0 BV 130 5 (SUTURE) ×3 IMPLANT
SUTURE EHLN 10-0 CS-B-6CS-B-6 (SUTURE) IMPLANT
SYR 3ML LL SCALE MARK (SYRINGE) ×9 IMPLANT
SYRINGE 10CC LL (SYRINGE) ×3 IMPLANT
TAPERED BLUNT TIP STR 20-23GA (MISCELLANEOUS) ×3
TUTOPLAST SCIERA 0.5X1.0 (Tissue) ×3 IMPLANT
VALVE GLAUCOMA AHMED (Prosthesis & Implant Heart) ×3 IMPLANT
WATER STERILE IRR 250ML POUR (IV SOLUTION) ×3 IMPLANT
WIPE NON LINTING 3.25X3.25 (MISCELLANEOUS) ×3 IMPLANT

## 2016-05-06 NOTE — Transfer of Care (Signed)
Immediate Anesthesia Transfer of Care Note  Patient: Karen Krueger  Procedure(s) Performed: Procedure(s) with comments: AQUEOUS SHUNT (Right) - SCLERAL PATCH GRAFT AHMED FP7  Patient Location: PACU  Anesthesia Type: MAC  Level of Consciousness: awake, alert  and patient cooperative  Airway and Oxygen Therapy: Patient Spontanous Breathing and Patient connected to supplemental oxygen  Post-op Assessment: Post-op Vital signs reviewed, Patient's Cardiovascular Status Stable, Respiratory Function Stable, Patent Airway and No signs of Nausea or vomiting  Post-op Vital Signs: Reviewed and stable  Complications: No apparent anesthesia complications

## 2016-05-06 NOTE — Anesthesia Postprocedure Evaluation (Signed)
Anesthesia Post Note  Patient: Karen Krueger  Procedure(s) Performed: Procedure(s) (LRB): AQUEOUS SHUNT (Right)  Patient location during evaluation: PACU Anesthesia Type: MAC Level of consciousness: awake and alert and oriented Pain management: satisfactory to patient Vital Signs Assessment: post-procedure vital signs reviewed and stable Respiratory status: spontaneous breathing, nonlabored ventilation and respiratory function stable Cardiovascular status: blood pressure returned to baseline and stable Postop Assessment: Adequate PO intake and No signs of nausea or vomiting Anesthetic complications: no    Raliegh Ip

## 2016-05-06 NOTE — Anesthesia Procedure Notes (Signed)
Procedure Name: MAC Performed by: Amaru Burroughs Pre-anesthesia Checklist: Patient identified, Emergency Drugs available, Suction available, Timeout performed and Patient being monitored Patient Re-evaluated:Patient Re-evaluated prior to inductionOxygen Delivery Method: Nasal cannula Placement Confirmation: positive ETCO2       

## 2016-05-06 NOTE — Anesthesia Preprocedure Evaluation (Signed)
Anesthesia Evaluation  Patient identified by MRN, date of birth, ID band Patient awake    Reviewed: Allergy & Precautions, H&P , NPO status , Patient's Chart, lab work & pertinent test results  Airway Mallampati: II  TM Distance: >3 FB Neck ROM: full    Dental no notable dental hx.    Pulmonary    Pulmonary exam normal        Cardiovascular hypertension, Normal cardiovascular exam     Neuro/Psych    GI/Hepatic   Endo/Other    Renal/GU      Musculoskeletal   Abdominal   Peds  Hematology   Anesthesia Other Findings   Reproductive/Obstetrics                             Anesthesia Physical Anesthesia Plan  ASA: II  Anesthesia Plan: MAC   Post-op Pain Management:    Induction:   Airway Management Planned:   Additional Equipment:   Intra-op Plan:   Post-operative Plan:   Informed Consent: I have reviewed the patients History and Physical, chart, labs and discussed the procedure including the risks, benefits and alternatives for the proposed anesthesia with the patient or authorized representative who has indicated his/her understanding and acceptance.     Plan Discussed with:   Anesthesia Plan Comments:         Anesthesia Quick Evaluation  

## 2016-05-06 NOTE — H&P (Signed)
H+P reviewed and is up to date, please see paper chart.  

## 2016-05-06 NOTE — Op Note (Signed)
Date of Surgery: 05/06/16  PREOPERATIVE DIAGNOSES: 1. Primary open angle glaucoma, right eye, severe stage.  POSTOPERATIVE DIAGNOSES: 1. Same  PROCEDURE PERFORMED: 1. Ahmed drainage device placement, right eye. 2. Coverage of glaucoma drainage device with Tutoplast sclera, right eye.  SURGEON: Almon Hercules, MD.    ANESTHESIA: Monitored anesthesia care.  IMPLANTS:   Implant Name Type Inv. Item Serial No. Manufacturer Lot No. LRB No. Used  TUTOPLAST SCIERA 0.5X1.0 - [id number] Tissue TUTOPLAST SCIERA 0.5X1.0 [id number] RIT  Right 1  VALVE GLAUCOMA AHMED - [id number] Prosthesis & Implant Heart VALVE GLAUCOMA AHMED [id number] [employer] 0000000 Right 1    COMPLICATIONS: None.  DESCRIPTION OF PROCEDURE: After informed consent was obtained, the patient was brought to the operating room and placed in the supine position.  The patient was then prepped and draped in the usual sterile fashion for intraocular surgery on the right eye.  A wire lid speculum was placed.  A 7-0 vicryl suture was placed through the superotemporal limbal cornea and the eye was rotated to expose the superotemporal quadrant.  Using Westcott scissors, a small incision through conjunctiva and Tenons was made in the superotemporal quadrant approximately 4 mm posterior to the limbus. A block which consisted of 2 mL of 50% of 4% Xylocaine without epinephrine and 50% of 0.75% Marcaine was given at sub-Tenons level. Tenons were then dissected from sclera posteriorly and anteriorly with blunt Westcott dissection. Hemostasis was achieved with cautery.  An Ahmed drainage device, model FP7, was removed from its packaging, inspected, and found to be in good condition.  Balanced salt solution on a cannula was used to irrigate the tube, and free flow was noted above the plate. The implant was placed in the retrobulbar space between the superior and lateral rectus muscles and two 8-0 nylon sutures were placed through the eyelets of the  implant. A 22-gauge needle was used to enter the anterior chamber 3 mm from the superior limbus supero-temporally.  The tube was trimmed to length andplaced through the needle tract and set to rest above the iris with no corneal touch noted.  The tube was then approximated to the globe using a single 8-0 nylon figure-of- eight suture.  Donor scleral overlay placed over the tube and sewn in place using 1 interrupted 7-0 vicryl suture.  The conjunctival incision was closed with running 8-0 Vicryl sutures.  At the end of the case, the corneal limbal traction suture was removed as was the wire lid speculum.  The eye was dressed with an application of atropine drop, Maxitrol ointment, and a Fox shield was placed.  The patient was brought to the recovery area having tolerated the procedure with no complications.

## 2016-05-07 ENCOUNTER — Encounter: Payer: Self-pay | Admitting: Ophthalmology

## 2016-06-04 ENCOUNTER — Emergency Department
Admission: EM | Admit: 2016-06-04 | Discharge: 2016-06-04 | Disposition: A | Discharge status: Home / Self Care | Payer: Medicare Other | Attending: Emergency Medicine | Admitting: Emergency Medicine

## 2016-06-04 ENCOUNTER — Emergency Department: Payer: Medicare Other

## 2016-06-04 ENCOUNTER — Encounter: Payer: Self-pay | Admitting: Emergency Medicine

## 2016-06-04 DIAGNOSIS — Y939 Activity, unspecified: Secondary | ICD-10-CM | POA: Insufficient documentation

## 2016-06-04 DIAGNOSIS — L03113 Cellulitis of right upper limb: Secondary | ICD-10-CM

## 2016-06-04 DIAGNOSIS — Z87891 Personal history of nicotine dependence: Secondary | ICD-10-CM | POA: Diagnosis not present

## 2016-06-04 DIAGNOSIS — S4991XA Unspecified injury of right shoulder and upper arm, initial encounter: Secondary | ICD-10-CM | POA: Diagnosis present

## 2016-06-04 DIAGNOSIS — I10 Essential (primary) hypertension: Secondary | ICD-10-CM | POA: Insufficient documentation

## 2016-06-04 DIAGNOSIS — Z79899 Other long term (current) drug therapy: Secondary | ICD-10-CM | POA: Diagnosis not present

## 2016-06-04 DIAGNOSIS — Z85828 Personal history of other malignant neoplasm of skin: Secondary | ICD-10-CM | POA: Insufficient documentation

## 2016-06-04 DIAGNOSIS — Y929 Unspecified place or not applicable: Secondary | ICD-10-CM | POA: Insufficient documentation

## 2016-06-04 DIAGNOSIS — Y999 Unspecified external cause status: Secondary | ICD-10-CM | POA: Insufficient documentation

## 2016-06-04 DIAGNOSIS — W19XXXA Unspecified fall, initial encounter: Secondary | ICD-10-CM | POA: Insufficient documentation

## 2016-06-04 LAB — COMPREHENSIVE METABOLIC PANEL
ALK PHOS: 64 U/L (ref 38–126)
ALT: 11 U/L — AB (ref 14–54)
AST: 20 U/L (ref 15–41)
Albumin: 4.5 g/dL (ref 3.5–5.0)
Anion gap: 10 (ref 5–15)
BUN: 15 mg/dL (ref 6–20)
CALCIUM: 9.5 mg/dL (ref 8.9–10.3)
CO2: 27 mmol/L (ref 22–32)
CREATININE: 0.58 mg/dL (ref 0.44–1.00)
Chloride: 100 mmol/L — ABNORMAL LOW (ref 101–111)
GFR calc non Af Amer: >60 mL/min (ref >60)
GLUCOSE: 105 mg/dL — AB (ref 65–99)
Potassium: 3.5 mmol/L (ref 3.5–5.1)
SODIUM: 137 mmol/L (ref 135–145)
Total Bilirubin: 2.7 mg/dL — ABNORMAL HIGH (ref 0.3–1.2)
Total Protein: 8.8 g/dL — ABNORMAL HIGH (ref 6.5–8.1)

## 2016-06-04 LAB — CBC WITH DIFFERENTIAL/PLATELET
BASOS PCT: 0 %
Basophils Absolute: 0 10*3/uL (ref 0–0.1)
EOS ABS: 0 10*3/uL (ref 0–0.7)
EOS PCT: 0 %
HCT: 38.5 % (ref 35.0–47.0)
Hemoglobin: 13.1 g/dL (ref 12.0–16.0)
Lymphocytes Relative: 14 %
Lymphs Abs: 1 10*3/uL (ref 1.0–3.6)
MCH: 31.3 pg (ref 26.0–34.0)
MCHC: 34 g/dL (ref 32.0–36.0)
MCV: 92.1 fL (ref 80.0–100.0)
MONO ABS: 1.2 10*3/uL — AB (ref 0.2–0.9)
MONOS PCT: 17 %
NEUTROS PCT: 69 %
Neutro Abs: 4.9 10*3/uL (ref 1.4–6.5)
PLATELETS: 235 10*3/uL (ref 150–440)
RBC: 4.18 MIL/uL (ref 3.80–5.20)
RDW: 14.6 % — AB (ref 11.5–14.5)
WBC: 7 10*3/uL (ref 3.6–11.0)

## 2016-06-04 LAB — URIC ACID: URIC ACID, SERUM: 3.4 mg/dL (ref 2.3–6.6)

## 2016-06-04 MED ORDER — SULFAMETHOXAZOLE-TRIMETHOPRIM 800-160 MG PO TABS: 1.0000 | ORAL_TABLET | Freq: Two times a day (BID) | ORAL | 0 refills | Status: AC

## 2016-06-04 MED ORDER — CEFTRIAXONE SODIUM-DEXTROSE 2-2.22 GM-% IV SOLR
2.0000 g | Freq: Once | INTRAVENOUS | Status: AC
  Administered 2016-06-04: 2 g via INTRAVENOUS
  Filled 2016-06-04: qty 50

## 2016-06-04 MED ORDER — DEXTROSE 5 % IV SOLN: 2.0000 g | Freq: Once | INTRAVENOUS | Status: DC

## 2016-06-04 NOTE — ED Provider Notes (Signed)
[facility] Emergency Department Provider Note ____________________________________________  Time seen: Approximately 11:55 AM  I have reviewed the triage vital signs and the nursing notes.   HISTORY  Chief Complaint Wrist Pain  HPI Karen Krueger is a 82 y.o. female fell one week ago and injured left wrist. The patient states that she did not cut herself and did not bleed after incident. Patient says that swelling and redness started to get worse a couple days ago. Patient is unable to give a good history of the incident but states that she did not lose consciousness or hit head. 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 Patient has not been confused. Patient did not see anyone after fall. Patient has not taken anything for pain. Patient had laser eye surgery a couple weeks ago and is unsure if she took antibiotics before or after surgery. Patient has had gout in knee previously.   Past Medical History:  Diagnosis Date  . Blind left eye   . Cancer ([place])    previous skin cancer   . Glaucoma    blind in Left eye   . Hypertension    controlled with medication   . Stroke [facility])    two previous strokes, 2005, 2011     There are no active problems to display for this patient.   Past Surgical History:  Procedure Laterality Date  . ABDOMINAL HYSTERECTOMY    . APPENDECTOMY    . AQUEOUS SHUNT Right 05/06/2016   Procedure: AQUEOUS SHUNT;  Surgeon: Ronnell Freshwater, MD;  Location: [place];  Service: Ophthalmology;  Laterality: Right;  SCLERAL PATCH GRAFT AHMED FP7  . EYE SURGERY     glucacoma numerous times     Prior to Admission medications   Medication Sig Start Date End Date Taking? Authorizing Provider  atorvastatin (LIPITOR) 40 MG tablet Take 40 mg by mouth daily.    Historical Provider, MD  bimatoprost (LUMIGAN) 0.01 % SOLN Place 1 drop into the right eye at bedtime.     Historical Provider, MD  brinzolamide (AZOPT) 1 % ophthalmic suspension Place 1 drop into the right eye 2 (two) times daily.    Historical Provider, MD  Cholecalciferol (VITAMIN D3) 50000 units CAPS Take by mouth once a week.    Historical Provider, MD  donepezil (ARICEPT) 5 MG tablet Take 5 mg by mouth at bedtime.    Historical Provider, MD  latanoprost (XALATAN) 0.005 % ophthalmic solution Place 1 drop into both eyes at bedtime.    Historical Provider, MD  prednisoLONE acetate (PRED FORTE) 1 % ophthalmic suspension Place 1 drop into the left eye 2 (two) times daily.    Historical Provider, MD  sulfamethoxazole-trimethoprim (BACTRIM DS,SEPTRA DS) 800-160 MG tablet Take 1 tablet by mouth 2 (two) times daily. 06/04/16 06/14/16  Victorino Dike, FNP  timolol (BETIMOL) 0.5 % ophthalmic solution Place 1 drop into the right eye 2 (two) times daily.    Historical Provider, MD  verapamil (CALAN-SR) 180 MG CR tablet Take 180 mg by mouth 2 (two) times daily.    Historical Provider, MD    Allergies Codeine and Penicillins  History reviewed. No pertinent family history.  Social History Social History  Substance Use Topics  . Smoking status: Never Smoker  . Smokeless tobacco: Never Used     Comment: quit over 45 yrs ago  . Alcohol use No    Review of  Systems Constitutional: No recent illness. No fever. HEENT: No vision changes. Cardiovascular: Denies chest pain or palpitations. Respiratory: Denies shortness of breath. Abdominal: Denies abdominal pain. No diarrhea or constipation. Musculoskeletal: Limited ROM of left wrist. Skin: Negative for rash, wound, lesion. Neurological: Negative for focal weakness or numbness.  ____________________________________________   PHYSICAL EXAM:  VITAL SIGNS: ED Triage Vitals  Enc Vitals Group     BP 06/04/16 1108 132/69     Pulse Rate 06/04/16 1108 85     Resp 06/04/16 1108 18     Temp 06/04/16 1108 98.4 F (36.9 C)     Temp Source 06/04/16 1108  Oral     SpO2 06/04/16 1108 96 %     Weight 06/04/16 1109 122 lb (55.3 kg)     Height --      Head Circumference --      Peak Flow --      Pain Score 06/04/16 1109 7     Pain Loc --      Pain Edu? --      Excl. in [place]? --     Constitutional: Alert and oriented. Well appearing and in no acute distress. Eyes: Conjunctivae are normal. EOMI. Head: Atraumatic. Neck: No stridor.  Respiratory: Normal respiratory effort.   Musculoskeletal: Limited ROM due to pain of left wrist.  Neurologic:  Normal speech and language. No gross focal neurologic deficits are appreciated. Speech is normal. No gait instability. Skin: Left wrist is swollen, red, and warm to touch. Skin is warm, dry and intact. Atraumatic.  Psychiatric: Mood and affect are normal. Speech and behavior are normal.  ____________________________________________   LABS (all labs ordered are listed, but only abnormal results are displayed)  Labs Reviewed  COMPREHENSIVE METABOLIC PANEL - Abnormal; Notable for the following:       Result Value   Chloride 100 (*)    Glucose, Bld 105 (*)    Total Protein 8.8 (*)    ALT 11 (*)    Total Bilirubin 2.7 (*)    All other components within normal limits  CBC WITH DIFFERENTIAL/PLATELET - Abnormal; Notable for the following:    RDW 14.6 (*)    Monocytes Absolute 1.2 (*)    All other components within normal limits  CULTURE, BLOOD (ROUTINE X 2)  CULTURE, BLOOD (ROUTINE X 2)  URIC ACID   ____________________________________________  RADIOLOGY  Robinette Haines, personally viewed and evaluated these images (plain radiographs) as part of my medical decision making, as well as reviewing the written report by the radiologist.  No acute bony processes per radiology.  ____________________________________________   PROCEDURES   INITIAL IMPRESSION / ASSESSMENT AND PLAN / ED COURSE  Clinical Course     Pertinent labs & imaging results that were available during my care of the  patient were reviewed by me and considered in my medical decision making (see chart for details).  My assessment is that the patient has cellulitis. No acute bony abnormalities were seen on xray per radiology. Uric acid level did not indicate gout. Main area of warmth and redness is not directly over joint so septic arthritis is less likely. Patient does not have a white count, fever, tachycardia so patient does not need to stay for IV antibiotics. Patient was instructed to return if symptoms worsen. Kidney function was fine so a course of Bactrim was started.  ____________________________________________   FINAL CLINICAL IMPRESSION(S) / ED DIAGNOSES  Final diagnoses:  Cellulitis of right upper extremity  Laban Emperor, PA-C 06/04/16 [place], MD 06/04/16 [phone]

## 2016-06-04 NOTE — ED Triage Notes (Signed)
Pt to ed with c/o left wrist pain x 1 week after falling.  Pt with redness and swelling noted to left wrist.  +pulse noted.

## 2016-06-09 LAB — CULTURE, BLOOD (ROUTINE X 2)
CULTURE: NO GROWTH
Culture: NO GROWTH

## 2016-08-02 ENCOUNTER — Emergency Department: Payer: Medicare Other

## 2016-08-02 ENCOUNTER — Encounter: Payer: Self-pay | Admitting: Emergency Medicine

## 2016-08-02 ENCOUNTER — Observation Stay
Admission: EM | Admit: 2016-08-02 | Discharge: 2016-08-03 | Disposition: A | Discharge status: Home / Self Care | Payer: Medicare Other | Attending: Internal Medicine | Admitting: Internal Medicine

## 2016-08-02 DIAGNOSIS — Y998 Other external cause status: Secondary | ICD-10-CM | POA: Insufficient documentation

## 2016-08-02 DIAGNOSIS — Z85828 Personal history of other malignant neoplasm of skin: Secondary | ICD-10-CM | POA: Insufficient documentation

## 2016-08-02 DIAGNOSIS — M5030 Other cervical disc degeneration, unspecified cervical region: Secondary | ICD-10-CM | POA: Diagnosis not present

## 2016-08-02 DIAGNOSIS — R441 Visual hallucinations: Secondary | ICD-10-CM | POA: Insufficient documentation

## 2016-08-02 DIAGNOSIS — S2231XA Fracture of one rib, right side, initial encounter for closed fracture: Secondary | ICD-10-CM | POA: Diagnosis not present

## 2016-08-02 DIAGNOSIS — I700 Atherosclerosis of aorta: Secondary | ICD-10-CM | POA: Diagnosis not present

## 2016-08-02 DIAGNOSIS — Z882 Allergy status to sulfonamides status: Secondary | ICD-10-CM | POA: Diagnosis not present

## 2016-08-02 DIAGNOSIS — H5462 Unqualified visual loss, left eye, normal vision right eye: Secondary | ICD-10-CM | POA: Insufficient documentation

## 2016-08-02 DIAGNOSIS — G9340 Encephalopathy, unspecified: Principal | ICD-10-CM | POA: Diagnosis present

## 2016-08-02 DIAGNOSIS — R4182 Altered mental status, unspecified: Secondary | ICD-10-CM

## 2016-08-02 DIAGNOSIS — R7989 Other specified abnormal findings of blood chemistry: Secondary | ICD-10-CM | POA: Insufficient documentation

## 2016-08-02 DIAGNOSIS — F0280 Dementia in other diseases classified elsewhere without behavioral disturbance: Secondary | ICD-10-CM

## 2016-08-02 DIAGNOSIS — R778 Other specified abnormalities of plasma proteins: Secondary | ICD-10-CM | POA: Diagnosis present

## 2016-08-02 DIAGNOSIS — I10 Essential (primary) hypertension: Secondary | ICD-10-CM | POA: Diagnosis not present

## 2016-08-02 DIAGNOSIS — Y93E8 Activity, other personal hygiene: Secondary | ICD-10-CM | POA: Insufficient documentation

## 2016-08-02 DIAGNOSIS — F0281 Dementia in other diseases classified elsewhere with behavioral disturbance: Secondary | ICD-10-CM | POA: Insufficient documentation

## 2016-08-02 DIAGNOSIS — F329 Major depressive disorder, single episode, unspecified: Secondary | ICD-10-CM | POA: Insufficient documentation

## 2016-08-02 DIAGNOSIS — R748 Abnormal levels of other serum enzymes: Secondary | ICD-10-CM | POA: Insufficient documentation

## 2016-08-02 DIAGNOSIS — Z8673 Personal history of transient ischemic attack (TIA), and cerebral infarction without residual deficits: Secondary | ICD-10-CM | POA: Insufficient documentation

## 2016-08-02 DIAGNOSIS — Z880 Allergy status to penicillin: Secondary | ICD-10-CM | POA: Diagnosis not present

## 2016-08-02 DIAGNOSIS — Z885 Allergy status to narcotic agent status: Secondary | ICD-10-CM | POA: Insufficient documentation

## 2016-08-02 DIAGNOSIS — W010XXA Fall on same level from slipping, tripping and stumbling without subsequent striking against object, initial encounter: Secondary | ICD-10-CM | POA: Insufficient documentation

## 2016-08-02 DIAGNOSIS — H409 Unspecified glaucoma: Secondary | ICD-10-CM | POA: Insufficient documentation

## 2016-08-02 DIAGNOSIS — Y92002 Bathroom of unspecified non-institutional (private) residence single-family (private) house as the place of occurrence of the external cause: Secondary | ICD-10-CM | POA: Diagnosis not present

## 2016-08-02 DIAGNOSIS — G9389 Other specified disorders of brain: Secondary | ICD-10-CM | POA: Insufficient documentation

## 2016-08-02 DIAGNOSIS — W19XXXA Unspecified fall, initial encounter: Secondary | ICD-10-CM | POA: Diagnosis present

## 2016-08-02 DIAGNOSIS — G309 Alzheimer's disease, unspecified: Secondary | ICD-10-CM | POA: Diagnosis not present

## 2016-08-02 LAB — CBC WITH DIFFERENTIAL/PLATELET
BASOS PCT: 0 %
Basophils Absolute: 0 10*3/uL (ref 0–0.1)
EOS ABS: 0.1 10*3/uL (ref 0–0.7)
EOS PCT: 2 %
HCT: 36.1 % (ref 35.0–47.0)
Hemoglobin: 12.3 g/dL (ref 12.0–16.0)
LYMPHS ABS: 1.1 10*3/uL (ref 1.0–3.6)
Lymphocytes Relative: 17 %
MCH: 30.4 pg (ref 26.0–34.0)
MCHC: 34.1 g/dL (ref 32.0–36.0)
MCV: 89.3 fL (ref 80.0–100.0)
Monocytes Absolute: 1.1 10*3/uL — ABNORMAL HIGH (ref 0.2–0.9)
Monocytes Relative: 17 %
Neutro Abs: 4.2 10*3/uL (ref 1.4–6.5)
Neutrophils Relative %: 64 %
PLATELETS: 371 10*3/uL (ref 150–440)
RBC: 4.04 MIL/uL (ref 3.80–5.20)
RDW: 15.8 % — ABNORMAL HIGH (ref 11.5–14.5)
WBC: 6.5 10*3/uL (ref 3.6–11.0)

## 2016-08-02 LAB — URINALYSIS, COMPLETE (UACMP) WITH MICROSCOPIC
BILIRUBIN URINE: NEGATIVE
Bacteria, UA: NONE SEEN
Glucose, UA: NEGATIVE mg/dL
Hgb urine dipstick: NEGATIVE
Ketones, ur: NEGATIVE mg/dL
Leukocytes, UA: NEGATIVE
Nitrite: NEGATIVE
PH: 7 (ref 5.0–8.0)
Protein, ur: NEGATIVE mg/dL
SPECIFIC GRAVITY, URINE: 1.011 (ref 1.005–1.030)

## 2016-08-02 LAB — COMPREHENSIVE METABOLIC PANEL
ALK PHOS: 78 U/L (ref 38–126)
ALT: 11 U/L — ABNORMAL LOW (ref 14–54)
ANION GAP: 8 (ref 5–15)
AST: 29 U/L (ref 15–41)
Albumin: 3.2 g/dL — ABNORMAL LOW (ref 3.5–5.0)
BUN: 9 mg/dL (ref 6–20)
CALCIUM: 9.2 mg/dL (ref 8.9–10.3)
CO2: 28 mmol/L (ref 22–32)
Chloride: 101 mmol/L (ref 101–111)
Creatinine, Ser: 0.83 mg/dL (ref 0.44–1.00)
Glucose, Bld: 106 mg/dL — ABNORMAL HIGH (ref 65–99)
Potassium: 3.6 mmol/L (ref 3.5–5.1)
Sodium: 137 mmol/L (ref 135–145)
TOTAL PROTEIN: 7.5 g/dL (ref 6.5–8.1)
Total Bilirubin: 1.1 mg/dL (ref 0.3–1.2)

## 2016-08-02 LAB — TROPONIN I: TROPONIN I: 0.03 ng/mL — AB (ref <0.03)

## 2016-08-02 LAB — LACTIC ACID, PLASMA: LACTIC ACID, VENOUS: 1.4 mmol/L (ref 0.5–1.9)

## 2016-08-02 MED ORDER — GADOBENATE DIMEGLUMINE 529 MG/ML IV SOLN
10.0000 mL | Freq: Once | INTRAVENOUS | Status: AC | PRN
  Administered 2016-08-02: 10 mL via INTRAVENOUS

## 2016-08-02 MED ORDER — ACETAMINOPHEN 650 MG RE SUPP: 650.0000 mg | Freq: Four times a day (QID) | RECTAL | Status: DC | PRN

## 2016-08-02 MED ORDER — SODIUM CHLORIDE 0.9 % IV SOLN
INTRAVENOUS | Status: AC
  Administered 2016-08-02: 23:00:00 via INTRAVENOUS

## 2016-08-02 MED ORDER — ACETAMINOPHEN 325 MG PO TABS: 650.0000 mg | ORAL_TABLET | Freq: Four times a day (QID) | ORAL | Status: DC | PRN

## 2016-08-02 MED ORDER — VERAPAMIL HCL ER 180 MG PO TBCR
180.0000 mg | EXTENDED_RELEASE_TABLET | Freq: Two times a day (BID) | ORAL | Status: DC
  Administered 2016-08-03: 180 mg via ORAL
  Filled 2016-08-02 (×2): qty 1

## 2016-08-02 MED ORDER — ENOXAPARIN SODIUM 40 MG/0.4ML ~~LOC~~ SOLN
40.0000 mg | SUBCUTANEOUS | Status: DC
  Administered 2016-08-02: 40 mg via SUBCUTANEOUS
  Filled 2016-08-02: qty 0.4

## 2016-08-02 MED ORDER — ONDANSETRON HCL 4 MG PO TABS: 4.0000 mg | ORAL_TABLET | Freq: Four times a day (QID) | ORAL | Status: DC | PRN

## 2016-08-02 MED ORDER — LATANOPROST 0.005 % OP SOLN
1.0000 [drp] | Freq: Every day | OPHTHALMIC | Status: DC
  Administered 2016-08-02: 1 [drp] via OPHTHALMIC
  Filled 2016-08-02: qty 2.5

## 2016-08-02 MED ORDER — ONDANSETRON HCL 4 MG/2ML IJ SOLN: 4.0000 mg | Freq: Four times a day (QID) | INTRAMUSCULAR | Status: DC | PRN

## 2016-08-02 MED ORDER — TIMOLOL MALEATE (ONCE-DAILY) 0.5 % OP SOLN
1.0000 [drp] | Freq: Two times a day (BID) | OPHTHALMIC | Status: DC
  Administered 2016-08-02: 1 [drp] via OPHTHALMIC
  Filled 2016-08-02 (×2): qty 5

## 2016-08-02 NOTE — ED Notes (Signed)
Pt family reports fall occurred around 10 pm last night.

## 2016-08-02 NOTE — ED Notes (Signed)
Pt to MRI with medic Russell A.

## 2016-08-02 NOTE — ED Notes (Signed)
Patient has become more active at home.  Thinks she sees bugs and things that are not there.  She sees blood and things. On antibiotics from pcp for uti.  Patient could not answer why she was here.  Oriented to self and place.  Said year was 2017.  She is blind in left eye.

## 2016-08-02 NOTE — ED Notes (Signed)
Pt given food tray.
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
## 2016-08-02 NOTE — ED Notes (Signed)
Dr Cinda Quest informed of troponin critical result

## 2016-08-02 NOTE — ED Notes (Signed)
In and out cath for clear yellow urine.   °

## 2016-08-02 NOTE — ED Triage Notes (Signed)
Pt family reports pt fell last night in bathroom and hit her head and ever since then her behavior has changed. No obvious injury, no laceration or abrasion noted.  Pt family reports she has had a strong odor to her urine.

## 2016-08-02 NOTE — H&P (Signed)
[facility] [place] NAME: Karen Krueger    MR#:  [id number]  DATE OF BIRTH:  [birth year]  DATE OF ADMISSION:  08/02/2016  PRIMARY CARE PHYSICIAN: Idelle Crouch, MD   REQUESTING/REFERRING PHYSICIAN: Cinda Quest, MD  CHIEF COMPLAINT:   Chief Complaint  Patient presents with  . Fall  . Possible UTI    HISTORY OF PRESENT ILLNESS:  Karen Krueger  is a 83 y.o. female who presents with Fall at home yesterday, since which time her family states that she has been significantly confused, and hallucinating. Family states that typically she lives at home by herself and was diagnosed sometime ago with early signs of dementia, but really has not had much cognitive deficits since then. Order for the past 24 hours since her fall she has been confused, and hallucinating stating she has been seeing people who weren't there, as well as seeing her family members doing things to hurt themselves when they really weren't. Here in the ED her workup is largely negative upfront. Neurology was contacted by ED physician and they recommended imaging including CT scan and also MRI. These did not show any significant acute abnormality. Hospitalists were called for admission and further evaluation.  PAST MEDICAL HISTORY:   Past Medical History:  Diagnosis Date  . Blind left eye   . Cancer ([place])    previous skin cancer   . Glaucoma    blind in Left eye   . Hypertension    controlled with medication   . Stroke [facility])    two previous strokes, 2005, 2011     PAST SURGICAL HISTORY:   Past Surgical History:  Procedure Laterality Date  . ABDOMINAL HYSTERECTOMY    . APPENDECTOMY    . AQUEOUS SHUNT Right 05/06/2016   Procedure: AQUEOUS SHUNT;  Surgeon: Ronnell Freshwater, MD;  Location: [place];  Service: Ophthalmology;  Laterality: Right;  SCLERAL PATCH GRAFT AHMED FP7  . EYE SURGERY     glucacoma numerous times     SOCIAL HISTORY:    Social History  Substance Use Topics  . Smoking status: Never Smoker  . Smokeless tobacco: Never Used     Comment: quit over 45 yrs ago  . Alcohol use No    FAMILY HISTORY:   Family History  Problem Relation Age of Onset  . Heart disease Father   . Stroke Daughter     DRUG ALLERGIES:   Allergies  Allergen Reactions  . Codeine Nausea And Vomiting  . Sulfa Antibiotics Other (See Comments)    Unknown   . Penicillins Nausea Only    MEDICATIONS AT HOME:   Prior to Admission medications   Medication Sig Start Date End Date Taking? Authorizing Provider  bimatoprost (LUMIGAN) 0.01 % SOLN Place 1 drop into the right eye at bedtime.   Yes Historical Provider, MD  latanoprost (XALATAN) 0.005 % ophthalmic solution Place 1 drop into both eyes at bedtime.   Yes Historical Provider, MD  levofloxacin (LEVAQUIN) 500 MG tablet Take 1 tablet by mouth daily. 07/26/16  Yes Historical Provider, MD  Timolol Maleate (ISTALOL) 0.5 % (DAILY) SOLN Place 1 drop into the right eye 2 (two) times daily.    Yes Historical Provider, MD  verapamil (CALAN-SR) 180 MG CR tablet Take 180 mg by mouth 2 (two) times daily.   Yes Historical Provider, MD    REVIEW OF SYSTEMS:  Review of Systems  Unable to perform ROS: Dementia  Constitutional:  Negative for chills, fever, malaise/fatigue and weight loss.  HENT: Negative for ear pain, hearing loss and tinnitus.   Eyes: Negative for blurred vision, double vision, pain and redness.  Respiratory: Negative for cough, hemoptysis and shortness of breath.   Cardiovascular: Negative for chest pain, palpitations, orthopnea and leg swelling.  Gastrointestinal: Negative for abdominal pain, constipation, diarrhea, nausea and vomiting.  Genitourinary: Negative for dysuria, frequency and hematuria.  Musculoskeletal: Negative for back pain, joint pain and neck pain.  Skin:       No acne, rash, or lesions  Neurological: Negative for dizziness, tremors, focal weakness and  weakness.  Endo/Heme/Allergies: Negative for polydipsia. Does not bruise/bleed easily.  Psychiatric/Behavioral: Negative for depression. The patient is not nervous/anxious and does not have insomnia.      VITAL SIGNS:   Vitals:   08/02/16 1716 08/02/16 1800 08/02/16 1900 08/02/16 2035  BP:  122/72 122/70 132/73  Pulse:  78  81  Resp: 18 19 13 17   Temp:      TempSrc:      SpO2:  94%  96%  Weight:      Height:       Wt Readings from Last 3 Encounters:  08/02/16 52.2 kg (115 lb)  06/04/16 55.3 kg (122 lb)  05/06/16 55.3 kg (122 lb)    PHYSICAL EXAMINATION:  Physical Exam  Vitals reviewed. Constitutional: She appears well-developed and well-nourished. No distress.  HENT:  Head: Normocephalic and atraumatic.  Mouth/Throat: Oropharynx is clear and moist.  Eyes: Conjunctivae and EOM are normal. Pupils are equal, round, and reactive to light. No scleral icterus.  Neck: Normal range of motion. Neck supple. No JVD present. No thyromegaly present.  Cardiovascular: Normal rate, regular rhythm and intact distal pulses.  Exam reveals no gallop and no friction rub.   No murmur heard. Respiratory: Effort normal and breath sounds normal. No respiratory distress. She has no wheezes. She has no rales.  GI: Soft. Bowel sounds are normal. She exhibits no distension. There is no tenderness.  Musculoskeletal: Normal range of motion. She exhibits no edema.  No arthritis, no gout  Lymphadenopathy:    She has no cervical adenopathy.  Neurological: She is alert. No cranial nerve deficit.  No dysarthria, no aphasia  Skin: Skin is warm and dry. No rash noted. No erythema.  Psychiatric: She has a normal mood and affect.    LABORATORY PANEL:   CBC  Recent Labs Lab 08/02/16 1557  WBC 6.5  HGB 12.3  HCT 36.1  PLT 371   ------------------------------------------------------------------------------------------------------------------  Chemistries   Recent Labs Lab 08/02/16 1557  NA 137   K 3.6  CL 101  CO2 28  GLUCOSE 106*  BUN 9  CREATININE 0.83  CALCIUM 9.2  AST 29  ALT 11*  ALKPHOS 78  BILITOT 1.1   ------------------------------------------------------------------------------------------------------------------  Cardiac Enzymes  Recent Labs Lab 08/02/16 1557  TROPONINI 0.03*   ------------------------------------------------------------------------------------------------------------------  RADIOLOGY:  Ct Head Wo Contrast  Result Date: 08/02/2016 CLINICAL DATA:  Head injury after fall in bathroom last night. EXAM: CT HEAD WITHOUT CONTRAST CT CERVICAL SPINE WITHOUT CONTRAST TECHNIQUE: Multidetector CT imaging of the head and cervical spine was performed following the standard protocol without intravenous contrast. Multiplanar CT image reconstructions of the cervical spine were also generated. COMPARISON:  CT scan of July 13, 2014. FINDINGS: CT HEAD FINDINGS Brain: Mild chronic ischemic white matter disease is noted. Old thalamic infarction is noted. No mass effect or midline shift is noted. Ventricular size is within normal  limits. There is no evidence of mass lesion, hemorrhage or acute infarction. Vascular: Atherosclerosis of carotid siphons is noted. Skull: Normal. Negative for fracture or focal lesion. Sinuses/Orbits: No acute finding. Other: None. CT CERVICAL SPINE FINDINGS Alignment: Reversal of normal lordosis of cervical spine is noted which most likely is positional in origin. No spondylolisthesis is noted. Skull base and vertebrae: No acute fracture. No primary bone lesion or focal pathologic process. Soft tissues and spinal canal: No prevertebral fluid or swelling. No visible canal hematoma. Disc levels: Moderate degenerative disc disease is noted at C5-6 and C6-7. Upper chest: Negative. Other: None. IMPRESSION: Mild chronic ischemic white matter disease. No acute intracranial abnormality seen. Moderate degenerative disc disease. No acute abnormality  seen in the cervical spine. Electronically Signed   By: Marijo Conception, M.D.   On: 08/02/2016 16:46   Ct Cervical Spine Wo Contrast  Result Date: 08/02/2016 CLINICAL DATA:  Head injury after fall in bathroom last night. EXAM: CT HEAD WITHOUT CONTRAST CT CERVICAL SPINE WITHOUT CONTRAST TECHNIQUE: Multidetector CT imaging of the head and cervical spine was performed following the standard protocol without intravenous contrast. Multiplanar CT image reconstructions of the cervical spine were also generated. COMPARISON:  CT scan of July 13, 2014. FINDINGS: CT HEAD FINDINGS Brain: Mild chronic ischemic white matter disease is noted. Old thalamic infarction is noted. No mass effect or midline shift is noted. Ventricular size is within normal limits. There is no evidence of mass lesion, hemorrhage or acute infarction. Vascular: Atherosclerosis of carotid siphons is noted. Skull: Normal. Negative for fracture or focal lesion. Sinuses/Orbits: No acute finding. Other: None. CT CERVICAL SPINE FINDINGS Alignment: Reversal of normal lordosis of cervical spine is noted which most likely is positional in origin. No spondylolisthesis is noted. Skull base and vertebrae: No acute fracture. No primary bone lesion or focal pathologic process. Soft tissues and spinal canal: No prevertebral fluid or swelling. No visible canal hematoma. Disc levels: Moderate degenerative disc disease is noted at C5-6 and C6-7. Upper chest: Negative. Other: None. IMPRESSION: Mild chronic ischemic white matter disease. No acute intracranial abnormality seen. Moderate degenerative disc disease. No acute abnormality seen in the cervical spine. Electronically Signed   By: Marijo Conception, M.D.   On: 08/02/2016 16:46   Mr Jeri Cos And Wo Contrast  Result Date: 08/02/2016 CLINICAL DATA:  Recent fall.  Hallucinations. EXAM: MRI HEAD WITHOUT AND WITH CONTRAST TECHNIQUE: Multiplanar, multiecho pulse sequences of the brain and surrounding structures were  obtained without and with intravenous contrast. CONTRAST:  69mL MULTIHANCE GADOBENATE DIMEGLUMINE 529 MG/ML IV SOLN COMPARISON:  CT head earlier today.  MR head 07/14/2014. FINDINGS: The patient was unable to remain motionless for the exam. Small or subtle lesions could be overlooked. Brain: No acute infarction, hemorrhage, extra-axial collection or mass lesion. Moderate ventriculomegaly. No dramatic cortical atrophy. Prominence of the basal cisterns. Extensive white matter signal abnormality, both focal and confluent, favored to represent chronic microvascular ischemic change rather than transependymal absorption from normal pressure hydrocephalus. Post infusion, no abnormal enhancement. Vascular: Normal flow voids.  Major dural venous sinuses are patent. Skull and upper cervical spine: Normal marrow signal. Sinuses/Orbits: No layering sinus fluid. BILATERAL cataract extraction. Other: None. Compared with 2015, similar appearance. IMPRESSION: Ventriculomegaly, extensive white matter disease, and basilar cistern prominence favored to represent chronic microvascular ischemic change. No acute intracranial findings. Electronically Signed   By: Staci Righter M.D.   On: 08/02/2016 20:10   Dg Chest Portable 1 View  Result Date:  08/02/2016 CLINICAL DATA:  [place] last night in the bathroom, striking the head. Behavioral changes. EXAM: PORTABLE CHEST 1 VIEW COMPARISON:  11/03/2014 FINDINGS: Heart size is normal. Chronic aortic atherosclerosis. The lungs are clear. The vascularity is normal. Old right third rib fracture. No acute chest finding. IMPRESSION: No active disease. Electronically Signed   By: Nelson Chimes M.D.   On: 08/02/2016 15:57    EKG:   Orders placed or performed during the hospital encounter of 08/02/16  . ED EKG  . ED EKG  . EKG 12-Lead  . EKG 12-Lead    IMPRESSION AND PLAN:  Principal Problem:   Acute encephalopathy - unclear etiology, unknown if patient hit her head when she fell, but no  external signs of trauma, no acute findings on imaging.  Some possibility of a UTI, though her UA did not seem to indicate the same. We will await for urine culture to come back before starting any antibiotics. We will monitor her for 24 hours. We'll get a neurology consult in the morning. Active Problems:   Fall - unclear in still getting etiology at this time, workup as above and below.   Elevated troponin - no history of heart disease or heart failure, however with a mildly elevated troponin and some T-wave inversions on her EKG which do not seem to been in the past, there is some concern as to whether she may have had a cardiac event. We'll trend her troponins tonight, get an echocardiogram in the morning. If she has any abnormality in these tests we can get a cardiology consult at that time.   HTN (hypertension) - stable, continue home meds   Glaucoma - continue home dose eyedrops  All the records are reviewed and case discussed with ED provider. Management plans discussed with the patient and/or family.  DVT PROPHYLAXIS: SubQ lovenox  GI PROPHYLAXIS: None  ADMISSION STATUS: Observation  CODE STATUS: Full Code Status History    This patient does not have a recorded code status. Please follow your organizational policy for patients in this situation.    Advance Directive Documentation   Flowsheet Row Most Recent Value  Type of Advance Directive  Healthcare Power of Attorney  Pre-existing out of facility DNR order (yellow form or pink MOST form)  No data  "MOST" Form in Place?  No data      TOTAL TIME TAKING CARE OF THIS PATIENT: 40 minutes.    Jarryn Altland [place] 08/02/2016, 9:03 PM  [facility]  Office  [phone]  CC: Primary care physician; Idelle Crouch, MD

## 2016-08-03 ENCOUNTER — Observation Stay (HOSPITAL_BASED_OUTPATIENT_CLINIC_OR_DEPARTMENT_OTHER)
Admit: 2016-08-03 | Discharge: 2016-08-03 | Disposition: A | Discharge status: Home / Self Care | Payer: Medicare Other | Attending: Internal Medicine | Admitting: Internal Medicine

## 2016-08-03 DIAGNOSIS — R7989 Other specified abnormal findings of blood chemistry: Secondary | ICD-10-CM | POA: Diagnosis not present

## 2016-08-03 DIAGNOSIS — F0151 Vascular dementia with behavioral disturbance: Secondary | ICD-10-CM

## 2016-08-03 DIAGNOSIS — G9340 Encephalopathy, unspecified: Secondary | ICD-10-CM

## 2016-08-03 DIAGNOSIS — G309 Alzheimer's disease, unspecified: Secondary | ICD-10-CM

## 2016-08-03 DIAGNOSIS — R4182 Altered mental status, unspecified: Secondary | ICD-10-CM

## 2016-08-03 DIAGNOSIS — F0280 Dementia in other diseases classified elsewhere without behavioral disturbance: Secondary | ICD-10-CM

## 2016-08-03 LAB — BASIC METABOLIC PANEL
Anion gap: 6 (ref 5–15)
BUN: 11 mg/dL (ref 6–20)
CALCIUM: 8.3 mg/dL — AB (ref 8.9–10.3)
CO2: 25 mmol/L (ref 22–32)
CREATININE: 0.58 mg/dL (ref 0.44–1.00)
Chloride: 105 mmol/L (ref 101–111)
GFR calc Af Amer: >60 mL/min (ref >60)
GLUCOSE: 93 mg/dL (ref 65–99)
Potassium: 3.1 mmol/L — ABNORMAL LOW (ref 3.5–5.1)
Sodium: 136 mmol/L (ref 135–145)

## 2016-08-03 LAB — SEDIMENTATION RATE: Sed Rate: 49 mm/hr — ABNORMAL HIGH (ref 0–30)

## 2016-08-03 LAB — TROPONIN I

## 2016-08-03 LAB — CBC
HEMATOCRIT: 34.3 % — AB (ref 35.0–47.0)
Hemoglobin: 11.7 g/dL — ABNORMAL LOW (ref 12.0–16.0)
MCH: 30.4 pg (ref 26.0–34.0)
MCHC: 34.1 g/dL (ref 32.0–36.0)
MCV: 89.3 fL (ref 80.0–100.0)
PLATELETS: 324 10*3/uL (ref 150–440)
RBC: 3.85 MIL/uL (ref 3.80–5.20)
RDW: 15.7 % — AB (ref 11.5–14.5)
WBC: 5.1 10*3/uL (ref 3.6–11.0)

## 2016-08-03 LAB — TSH: TSH: 2.922 u[IU]/mL (ref 0.350–4.500)

## 2016-08-03 LAB — ECHOCARDIOGRAM COMPLETE
HEIGHTINCHES: 61 in
Weight: 1756.8 oz

## 2016-08-03 LAB — FOLATE: Folate: 11.5 ng/mL (ref >5.9)

## 2016-08-03 MED ORDER — POLYETHYLENE GLYCOL 3350 17 G PO PACK
17.0000 g | PACK | Freq: Every day | ORAL | Status: DC
  Administered 2016-08-03: 17 g via ORAL
  Filled 2016-08-03: qty 1

## 2016-08-03 MED ORDER — BISACODYL 10 MG RE SUPP
10.0000 mg | Freq: Every day | RECTAL | Status: DC
  Administered 2016-08-03: 10 mg via RECTAL
  Filled 2016-08-03: qty 1

## 2016-08-03 MED ORDER — POTASSIUM CHLORIDE 20 MEQ/15ML (10%) PO SOLN
40.0000 meq | Freq: Once | ORAL | Status: AC
  Administered 2016-08-03: 40 meq via ORAL
  Filled 2016-08-03: qty 30

## 2016-08-03 MED ORDER — POLYETHYLENE GLYCOL 3350 17 G PO PACK: 17.0000 g | PACK | Freq: Every day | ORAL | 0 refills | Status: DC

## 2016-08-03 MED ORDER — TIMOLOL MALEATE 0.5 % OP SOLN
1.0000 [drp] | Freq: Two times a day (BID) | OPHTHALMIC | Status: DC
  Administered 2016-08-03: 1 [drp] via OPHTHALMIC
  Filled 2016-08-03: qty 5

## 2016-08-03 NOTE — Care Management Note (Signed)
Case Management Note  Patient Details  Name: Karen Krueger MRN: [id number] Date of Birth: May 30, 1932  Subjective/Objective:      Discussed discharge planning with daughter [name]. [name] declined offer of home health services stating that she and her brother were already providing all the home health services ordered by [name]. Dr [name] was updated.               Action/Plan:   Expected Discharge Date:  08/03/16               Expected Discharge Plan:     In-House Referral:     Discharge planning Services     Post Acute Care Choice:    Choice offered to:     DME Arranged:    DME Agency:     HH Arranged:    HH Agency:     Status of Service:     If discussed at [employer] [facility] [employer], dates discussed:    Additional Comments:  [name] A, RN 08/03/2016, 9:38 AM

## 2016-08-03 NOTE — Progress Notes (Signed)
PT Cancellation Note  Patient Details Name: Karen Krueger MRN: [id number] DOB: 04/11/1932   Cancelled Treatment:    Reason Eval/Treat Not Completed: Other (comment). Consult received and chart reviewed. Pt very sleepy, most of history obtained from son in room. Attempted to perform mobility and there-ex with pt, however she declines reporting she doesn't feel good and to try again another time. Pt able to state name, however appears confused to place and situation. Per son, pt lives with him and his brother and they provide 24/7 assist. He has been assisting her with bed mobility and limited ambulation to [facility] and kitchen to eat. Reports they aren't interested in HHPT at this time and can provide necessary support. Does admit to fall recently, however pt denies pain. Equipment in home includes BSC. Educated son about lifting techniques to provide safe handling of pt. Will cancel current order as family not interested in therapy services. Please re-consult if needs change. RN updated.   Charlayne Vultaggio 08/03/2016, 2:21 PM Greggory Stallion, PT, DPT [phone]

## 2016-08-03 NOTE — Progress Notes (Signed)
[facility] [place] NAME: Karen Krueger    MR#:  [id number]  DATE OF BIRTH:  October 21, 1931  SUBJECTIVE:   Patient here with Worsening mental status after fall. Patient reports that she was diagnosed with dementia about a year ago by a neurologist Dr. Manuella Ghazi. Over time she has shown decline in her mental status. She has been sleeping over the past several months more than usual. She was started on antidepressants. She was recently taken off of Remeron which she had been prescribed to increase appetite.  REVIEW OF SYSTEMS:    Review of Systems  Constitutional: Negative.  Negative for chills, fever and malaise/fatigue.  HENT: Negative.  Negative for ear discharge, ear pain, hearing loss, nosebleeds and sore throat.   Eyes: Negative.  Negative for blurred vision and pain.  Respiratory: Negative.  Negative for cough, hemoptysis, shortness of breath and wheezing.   Cardiovascular: Negative.  Negative for chest pain, palpitations and leg swelling.  Gastrointestinal: Negative.  Negative for abdominal pain, blood in stool, diarrhea, nausea and vomiting.  Genitourinary: Negative.  Negative for dysuria.  Musculoskeletal: Negative.  Negative for back pain.  Skin: Negative.   Neurological: Negative for dizziness, tremors, speech change, focal weakness, seizures and headaches.  Endo/Heme/Allergies: Negative.  Does not bruise/bleed easily.  Psychiatric/Behavioral: Positive for memory loss. Negative for depression, hallucinations and suicidal ideas.   She is somewhat confused but denies as stated above Tolerating Diet: npo      DRUG ALLERGIES:   Allergies  Allergen Reactions  . Codeine Nausea And Vomiting  . Sulfa Antibiotics Other (See Comments)    Unknown   . Penicillins Nausea Only    VITALS:  Blood pressure 135/75, pulse (!) 114, temperature 97.8 F (36.6 C), temperature source Oral, resp. rate 16, height 5\' 1"  (1.549 m), weight 49.8 kg (109  lb 12.8 oz), SpO2 96 %.  PHYSICAL EXAMINATION:   Physical Exam    LABORATORY PANEL:   CBC  Recent Labs Lab 08/03/16 0422  WBC 5.1  HGB 11.7*  HCT 34.3*  PLT 324   ------------------------------------------------------------------------------------------------------------------  Chemistries   Recent Labs Lab 08/02/16 1557 08/03/16 0422  NA 137 136  K 3.6 3.1*  CL 101 105  CO2 28 25  GLUCOSE 106* 93  BUN 9 11  CREATININE 0.83 0.58  CALCIUM 9.2 8.3*  AST 29  --   ALT 11*  --   ALKPHOS 78  --   BILITOT 1.1  --    ------------------------------------------------------------------------------------------------------------------  Cardiac Enzymes  Recent Labs Lab 08/02/16 1557 08/02/16 2215 08/03/16 0422  TROPONINI 0.03* <0.03 <0.03   ------------------------------------------------------------------------------------------------------------------  RADIOLOGY:  Ct Head Wo Contrast  Result Date: 08/02/2016 CLINICAL DATA:  Head injury after fall in bathroom last night. EXAM: CT HEAD WITHOUT CONTRAST CT CERVICAL SPINE WITHOUT CONTRAST TECHNIQUE: Multidetector CT imaging of the head and cervical spine was performed following the standard protocol without intravenous contrast. Multiplanar CT image reconstructions of the cervical spine were also generated. COMPARISON:  CT scan of July 13, 2014. FINDINGS: CT HEAD FINDINGS Brain: Mild chronic ischemic white matter disease is noted. Old thalamic infarction is noted. No mass effect or midline shift is noted. Ventricular size is within normal limits. There is no evidence of mass lesion, hemorrhage or acute infarction. Vascular: Atherosclerosis of carotid siphons is noted. Skull: Normal. Negative for fracture or focal lesion. Sinuses/Orbits: No acute finding. Other: None. CT CERVICAL SPINE FINDINGS Alignment: Reversal of normal lordosis of cervical spine  is noted which most likely is positional in origin. No spondylolisthesis  is noted. Skull base and vertebrae: No acute fracture. No primary bone lesion or focal pathologic process. Soft tissues and spinal canal: No prevertebral fluid or swelling. No visible canal hematoma. Disc levels: Moderate degenerative disc disease is noted at C5-6 and C6-7. Upper chest: Negative. Other: None. IMPRESSION: Mild chronic ischemic white matter disease. No acute intracranial abnormality seen. Moderate degenerative disc disease. No acute abnormality seen in the cervical spine. Electronically Signed   By: Marijo Conception, M.D.   On: 08/02/2016 16:46   Ct Cervical Spine Wo Contrast  Result Date: 08/02/2016 CLINICAL DATA:  Head injury after fall in bathroom last night. EXAM: CT HEAD WITHOUT CONTRAST CT CERVICAL SPINE WITHOUT CONTRAST TECHNIQUE: Multidetector CT imaging of the head and cervical spine was performed following the standard protocol without intravenous contrast. Multiplanar CT image reconstructions of the cervical spine were also generated. COMPARISON:  CT scan of July 13, 2014. FINDINGS: CT HEAD FINDINGS Brain: Mild chronic ischemic white matter disease is noted. Old thalamic infarction is noted. No mass effect or midline shift is noted. Ventricular size is within normal limits. There is no evidence of mass lesion, hemorrhage or acute infarction. Vascular: Atherosclerosis of carotid siphons is noted. Skull: Normal. Negative for fracture or focal lesion. Sinuses/Orbits: No acute finding. Other: None. CT CERVICAL SPINE FINDINGS Alignment: Reversal of normal lordosis of cervical spine is noted which most likely is positional in origin. No spondylolisthesis is noted. Skull base and vertebrae: No acute fracture. No primary bone lesion or focal pathologic process. Soft tissues and spinal canal: No prevertebral fluid or swelling. No visible canal hematoma. Disc levels: Moderate degenerative disc disease is noted at C5-6 and C6-7. Upper chest: Negative. Other: None. IMPRESSION: Mild chronic  ischemic white matter disease. No acute intracranial abnormality seen. Moderate degenerative disc disease. No acute abnormality seen in the cervical spine. Electronically Signed   By: Marijo Conception, M.D.   On: 08/02/2016 16:46   Mr Jeri Cos And Wo Contrast  Result Date: 08/02/2016 CLINICAL DATA:  Recent fall.  Hallucinations. EXAM: MRI HEAD WITHOUT AND WITH CONTRAST TECHNIQUE: Multiplanar, multiecho pulse sequences of the brain and surrounding structures were obtained without and with intravenous contrast. CONTRAST:  30mL MULTIHANCE GADOBENATE DIMEGLUMINE 529 MG/ML IV SOLN COMPARISON:  CT head earlier today.  MR head 07/14/2014. FINDINGS: The patient was unable to remain motionless for the exam. Small or subtle lesions could be overlooked. Brain: No acute infarction, hemorrhage, extra-axial collection or mass lesion. Moderate ventriculomegaly. No dramatic cortical atrophy. Prominence of the basal cisterns. Extensive white matter signal abnormality, both focal and confluent, favored to represent chronic microvascular ischemic change rather than transependymal absorption from normal pressure hydrocephalus. Post infusion, no abnormal enhancement. Vascular: Normal flow voids.  Major dural venous sinuses are patent. Skull and upper cervical spine: Normal marrow signal. Sinuses/Orbits: No layering sinus fluid. BILATERAL cataract extraction. Other: None. Compared with 2015, similar appearance. IMPRESSION: Ventriculomegaly, extensive white matter disease, and basilar cistern prominence favored to represent chronic microvascular ischemic change. No acute intracranial findings. Electronically Signed   By: Staci Righter M.D.   On: 08/02/2016 20:10   Dg Chest Portable 1 View  Result Date: 08/02/2016 CLINICAL DATA:  [place] last night in the bathroom, striking the head. Behavioral changes. EXAM: PORTABLE CHEST 1 VIEW COMPARISON:  11/03/2014 FINDINGS: Heart size is normal. Chronic aortic atherosclerosis. The lungs are  clear. The vascularity is normal. Old right third rib fracture.  No acute chest finding. IMPRESSION: No active disease. Electronically Signed   By: Nelson Chimes M.D.   On: 08/02/2016 15:57     ASSESSMENT AND PLAN:    83 year old female who was diagnosed with dementia about a year ago status post mechanical fall and since that time has been confused and hallucinating.  1. Acute encephalopathy with negative workup including CT scan head, MRI brain, UA and CXR This can be from concussion or underlying dementia. Await neurology consultation  2. Elevated troponin: This was very minimal. She has no signs of ACS.  3. Essential hypertension: Continue verapamil  4. Glaucoma: Continue eyedrops     Management plans discussed with the patient and family and they are in agreement.  CODE STATUS: full  TOTAL TIME TAKING CARE OF THIS PATIENT: 34 minutes.     POSSIBLE D/C today, DEPENDING ON CLINICAL CONDITION.   Tranisha Tissue M.D on 08/03/2016 at 8:44 AM  Between 7am to 6pm - Pager - [phone] After 6pm go to [email or web address] - password [email or web address] [facility]  Office  [phone]  CC: Primary care physician; Idelle Crouch, MD  Note: This dictation was prepared with Dragon dictation along with smaller phrase technology. Any transcriptional errors that result from this process are unintentional.

## 2016-08-03 NOTE — Progress Notes (Signed)
CH responded to an OR for an AD. Pt was in bed and presented a little confused. Several family members were bedside. Daughter said she requested the AD on behalf of her mother. CH explained that the AD has to do with the Pt and she must be able to understand what is presented in the document. [place] left the material with the daughter at her request. CH is available for follow up as needed.    08/03/16 0900  Clinical Encounter Type  Visited With Patient;Patient and family together  Visit Type Initial;Spiritual support  Referral From Nurse  Spiritual Encounters  Spiritual Needs Literature

## 2016-08-03 NOTE — Discharge Summary (Signed)
[facility] [place] NAME: Karen Krueger    MR#:  [id number]  DATE OF BIRTH:  27-Sep-1931  DATE OF ADMISSION:  08/02/2016 ADMITTING PHYSICIAN: Lance Coon, MD  DATE OF DISCHARGE: 08/04/2015  PRIMARY CARE PHYSICIAN: SPARKS,JEFFREY D, MD    ADMISSION DIAGNOSIS:  Elevated troponin [R74.8] Altered mental status, unspecified altered mental status type [R41.82]  DISCHARGE DIAGNOSIS:  Principal Problem:   Acute encephalopathy Active Problems:   Fall   HTN (hypertension)   Glaucoma   Elevated troponin   SECONDARY DIAGNOSIS:   Past Medical History:  Diagnosis Date  . Blind left eye   . Cancer ([place])    previous skin cancer   . Glaucoma    blind in Left eye   . Hypertension    controlled with medication   . Stroke [facility])    two previous strokes, 2005, 2011     HOSPITAL COURSE:  83 year old female who was diagnosed with dementia about a year ago status post mechanical fall and since that time has been confused and hallucinating.  1. Acute encephalopathy with negative workup including CT scan head, MRI brain, UA and CXR She is experiencing progressive dementia as per NEUROLOGY consult. SHe may have underlying depression but per PSYCHIATRY consult due to her dementia this is hard to diagnosis at this time. She will have follow up with Dr Manuella Ghazi who has evaluated her in the past and diagnosed dementia. She will follow up with Dr Doy Hutching in regards to trying other medications for depression.  2. Elevated troponin: This was very minimal. She has no signs of ACS.  3. Essential hypertension: Continue verapamil  4. Glaucoma: Continue eyedrops    DISCHARGE CONDITIONS AND DIET:  Stable Regular diet  CONSULTS OBTAINED:  Treatment Team:  Alexis Goodell, MD  DRUG ALLERGIES:   Allergies  Allergen Reactions  . Codeine Nausea And Vomiting  . Sulfa Antibiotics Other (See Comments)    Unknown   . Penicillins Nausea Only    DISCHARGE  MEDICATIONS:   Current Discharge Medication List    START taking these medications   Details  polyethylene glycol (MIRALAX / GLYCOLAX) packet Take 17 g by mouth daily. Qty: 14 each, Refills: 0      CONTINUE these medications which have NOT CHANGED   Details  bimatoprost (LUMIGAN) 0.01 % SOLN Place 1 drop into the right eye at bedtime.    latanoprost (XALATAN) 0.005 % ophthalmic solution Place 1 drop into both eyes at bedtime.    levofloxacin (LEVAQUIN) 500 MG tablet Take 1 tablet by mouth daily.    Timolol Maleate (ISTALOL) 0.5 % (DAILY) SOLN Place 1 drop into the right eye 2 (two) times daily.     verapamil (CALAN-SR) 180 MG CR tablet Take 180 mg by mouth 2 (two) times daily.              Today   CHIEF COMPLAINT:  Doing ok this am confusion better no headache   VITAL SIGNS:  Blood pressure 128/79, pulse 90, temperature 98.3 F (36.8 C), temperature source Oral, resp. rate 18, height 5\' 1"  (1.549 m), weight 49.8 kg (109 lb 12.8 oz), SpO2 94 %.   REVIEW OF SYSTEMS:  Review of Systems  Constitutional: Negative.  Negative for chills, fever and malaise/fatigue.  HENT: Negative.  Negative for ear discharge, ear pain, hearing loss, nosebleeds and sore throat.   Eyes: Negative.  Negative for blurred vision and pain.  Respiratory: Negative.  Negative for  cough, hemoptysis, shortness of breath and wheezing.   Cardiovascular: Negative.  Negative for chest pain, palpitations and leg swelling.  Gastrointestinal: Negative.  Negative for abdominal pain, blood in stool, diarrhea, nausea and vomiting.  Genitourinary: Negative.  Negative for dysuria.  Musculoskeletal: Negative for back pain.  Skin: Negative.   Neurological: Negative for dizziness, tremors, speech change, focal weakness, seizures and headaches.  Endo/Heme/Allergies: Negative.  Does not bruise/bleed easily.  Psychiatric/Behavioral: Positive for memory loss. Negative for depression, hallucinations and suicidal  ideas.     PHYSICAL EXAMINATION:  GENERAL:  83 y.o.-year-old patient lying in the bed with no acute distress.  NECK:  Supple, no jugular venous distention. No thyroid enlargement, no tenderness.  LUNGS: Normal breath sounds bilaterally, no wheezing, rales,rhonchi  No use of accessory muscles of respiration.  CARDIOVASCULAR: S1, S2 normal. No murmurs, rubs, or gallops.  ABDOMEN: Soft, non-tender, non-distended. Bowel sounds present. No organomegaly or mass.  EXTREMITIES: No pedal edema, cyanosis, or clubbing.  PSYCHIATRIC: The patient is alert and oriented x name and place  SKIN: No obvious rash, lesion, or ulcer.   DATA REVIEW:   CBC  Recent Labs Lab 08/03/16 0422  WBC 5.1  HGB 11.7*  HCT 34.3*  PLT 324    Chemistries   Recent Labs Lab 08/02/16 1557 08/03/16 0422  NA 137 136  K 3.6 3.1*  CL 101 105  CO2 28 25  GLUCOSE 106* 93  BUN 9 11  CREATININE 0.83 0.58  CALCIUM 9.2 8.3*  AST 29  --   ALT 11*  --   ALKPHOS 78  --   BILITOT 1.1  --     Cardiac Enzymes  Recent Labs Lab 08/02/16 2215 08/03/16 0422 08/03/16 0933  TROPONINI <0.03 <0.03 <0.03    Microbiology Results  @[email or web address] @  RADIOLOGY:  Ct Head Wo Contrast  Result Date: 08/02/2016 CLINICAL DATA:  Head injury after fall in bathroom last night. EXAM: CT HEAD WITHOUT CONTRAST CT CERVICAL SPINE WITHOUT CONTRAST TECHNIQUE: Multidetector CT imaging of the head and cervical spine was performed following the standard protocol without intravenous contrast. Multiplanar CT image reconstructions of the cervical spine were also generated. COMPARISON:  CT scan of July 13, 2014. FINDINGS: CT HEAD FINDINGS Brain: Mild chronic ischemic white matter disease is noted. Old thalamic infarction is noted. No mass effect or midline shift is noted. Ventricular size is within normal limits. There is no evidence of mass lesion, hemorrhage or acute infarction. Vascular: Atherosclerosis of carotid siphons is noted. Skull:  Normal. Negative for fracture or focal lesion. Sinuses/Orbits: No acute finding. Other: None. CT CERVICAL SPINE FINDINGS Alignment: Reversal of normal lordosis of cervical spine is noted which most likely is positional in origin. No spondylolisthesis is noted. Skull base and vertebrae: No acute fracture. No primary bone lesion or focal pathologic process. Soft tissues and spinal canal: No prevertebral fluid or swelling. No visible canal hematoma. Disc levels: Moderate degenerative disc disease is noted at C5-6 and C6-7. Upper chest: Negative. Other: None. IMPRESSION: Mild chronic ischemic white matter disease. No acute intracranial abnormality seen. Moderate degenerative disc disease. No acute abnormality seen in the cervical spine. Electronically Signed   By: Marijo Conception, M.D.   On: 08/02/2016 16:46   Ct Cervical Spine Wo Contrast  Result Date: 08/02/2016 CLINICAL DATA:  Head injury after fall in bathroom last night. EXAM: CT HEAD WITHOUT CONTRAST CT CERVICAL SPINE WITHOUT CONTRAST TECHNIQUE: Multidetector CT imaging of the head and cervical spine was performed following the standard  protocol without intravenous contrast. Multiplanar CT image reconstructions of the cervical spine were also generated. COMPARISON:  CT scan of July 13, 2014. FINDINGS: CT HEAD FINDINGS Brain: Mild chronic ischemic white matter disease is noted. Old thalamic infarction is noted. No mass effect or midline shift is noted. Ventricular size is within normal limits. There is no evidence of mass lesion, hemorrhage or acute infarction. Vascular: Atherosclerosis of carotid siphons is noted. Skull: Normal. Negative for fracture or focal lesion. Sinuses/Orbits: No acute finding. Other: None. CT CERVICAL SPINE FINDINGS Alignment: Reversal of normal lordosis of cervical spine is noted which most likely is positional in origin. No spondylolisthesis is noted. Skull base and vertebrae: No acute fracture. No primary bone lesion or focal  pathologic process. Soft tissues and spinal canal: No prevertebral fluid or swelling. No visible canal hematoma. Disc levels: Moderate degenerative disc disease is noted at C5-6 and C6-7. Upper chest: Negative. Other: None. IMPRESSION: Mild chronic ischemic white matter disease. No acute intracranial abnormality seen. Moderate degenerative disc disease. No acute abnormality seen in the cervical spine. Electronically Signed   By: Marijo Conception, M.D.   On: 08/02/2016 16:46   Mr Jeri Cos And Wo Contrast  Result Date: 08/02/2016 CLINICAL DATA:  Recent fall.  Hallucinations. EXAM: MRI HEAD WITHOUT AND WITH CONTRAST TECHNIQUE: Multiplanar, multiecho pulse sequences of the brain and surrounding structures were obtained without and with intravenous contrast. CONTRAST:  51mL MULTIHANCE GADOBENATE DIMEGLUMINE 529 MG/ML IV SOLN COMPARISON:  CT head earlier today.  MR head 07/14/2014. FINDINGS: The patient was unable to remain motionless for the exam. Small or subtle lesions could be overlooked. Brain: No acute infarction, hemorrhage, extra-axial collection or mass lesion. Moderate ventriculomegaly. No dramatic cortical atrophy. Prominence of the basal cisterns. Extensive white matter signal abnormality, both focal and confluent, favored to represent chronic microvascular ischemic change rather than transependymal absorption from normal pressure hydrocephalus. Post infusion, no abnormal enhancement. Vascular: Normal flow voids.  Major dural venous sinuses are patent. Skull and upper cervical spine: Normal marrow signal. Sinuses/Orbits: No layering sinus fluid. BILATERAL cataract extraction. Other: None. Compared with 2015, similar appearance. IMPRESSION: Ventriculomegaly, extensive white matter disease, and basilar cistern prominence favored to represent chronic microvascular ischemic change. No acute intracranial findings. Electronically Signed   By: Staci Righter M.D.   On: 08/02/2016 20:10   Dg Chest Portable 1  View  Result Date: 08/02/2016 CLINICAL DATA:  [place] last night in the bathroom, striking the head. Behavioral changes. EXAM: PORTABLE CHEST 1 VIEW COMPARISON:  11/03/2014 FINDINGS: Heart size is normal. Chronic aortic atherosclerosis. The lungs are clear. The vascularity is normal. Old right third rib fracture. No acute chest finding. IMPRESSION: No active disease. Electronically Signed   By: Nelson Chimes M.D.   On: 08/02/2016 15:57      Management plans discussed with the patient's family and they are in agreement. Stable for discharge home  Patient should follow up with pcp  CODE STATUS:     Code Status Orders        Start     Ordered   08/02/16 2158  Full code  Continuous     08/02/16 2157    Code Status History    Date Active Date Inactive Code Status Order ID Comments User Context   This patient has a current code status but no historical code status.    Advance Directive Documentation   Flowsheet Row Most Recent Value  Type of Advance Directive  Healthcare Power of [place]  Pre-existing out of facility DNR order (yellow form or pink MOST form)  No data  "MOST" Form in Place?  No data      TOTAL TIME TAKING CARE OF THIS PATIENT: 38 minutes.    Note: This dictation was prepared with Dragon dictation along with smaller phrase technology. Any transcriptional errors that result from this process are unintentional.  Hina Gupta M.D on 08/03/2016 at 11:16 AM  Between 7am to 6pm - Pager - [phone] After 6pm go to [email or web address] - password [email or web address]  Office  [phone]  CC: Primary care physician; Idelle Crouch, MD

## 2016-08-03 NOTE — Progress Notes (Signed)
Patient is discharge home in a stable condition, summary and f/u care given to both pt and son, verbalized understanding , left with son

## 2016-08-03 NOTE — Consult Note (Signed)
[facility] Face-to-Face Psychiatry Consult   Reason for Consult:  Consult for this 83 year old woman currently in the hospital for acute mental status change after a fall Referring Physician:  Mody Patient Identification: Karen Krueger MRN:  [id number] Principal Diagnosis: Acute encephalopathy Diagnosis:   Patient Active Problem List   Diagnosis Date Noted  . Dementia due to Alzheimer's disease [G30.9, F02.80] 08/03/2016  . Fall [W19.XXXA] 08/02/2016  . Acute encephalopathy [G93.40] 08/02/2016  . HTN (hypertension) [I10] 08/02/2016  . Glaucoma [H40.9] 08/02/2016  . Elevated troponin [R74.8] 08/02/2016    Total Time spent with patient: 1 hour  Subjective:   Karen Krueger is a 83 y.o. female patient admitted with "I guess I'm as all right as any".  HPI:  Patient interviewed. Fortunately her children were also present and were able to give more information as the patient was not able to give much history. Chart reviewed. Notes reviewed including neurology note. Also spoke with hospitalist today. 83 year old woman came into the hospital yesterday with acute confusion after a fall at home. The children tell me that she actually had the fall the night before last. She didn't appear to be immediately different but the following morning was having visual hallucinations and confusion. In the hospital she was seen by neurology and had an MRI and a regular workup and no specific new condition was found. The children report that her memory has been going downhill for the last couple years. She's been clearly disabled by it at least since last spring. They are uncertain whether there is been any acute change to appetite or intake. The general agreement seems to be that she was more or less at her usual state of health until the fall a couple days ago. No report of her ever making any suicidal or hopeless statements. Patient apparently however had been started on a low dose of mirtazapine by her primary  care doctor in an attempt to treat depression. Reportedly she was unable to tolerate this because of oversedation and only took it a couple days. Patient herself was not able to answer most questions lucidly. Some of this appeared to be intentional uncooperativeness some of it also was confusion.  Social history: Lives with HER-2 adult sons who look out for her closely. Overall sounds like she has 3 adult children in the area who all are pretty involved in taking care of her.  Medical history: Hypertension. 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 No admission to psychiatric hospital no history of suicidal behavior. The children agreed that after her husband died she was very down for a while but it doesn't sound like she was ever treated for depression. No history of psychosis. They are not aware of any hallucinations until just the last couple days.  Risk to Self: Is patient at risk for suicide?: No Risk to Others:   Prior Inpatient Therapy:   Prior Outpatient Therapy:    Past Medical History:  Past Medical History:  Diagnosis Date  . Blind left eye   . Cancer ([place])    previous skin cancer   . Glaucoma    blind in Left eye   . Hypertension    controlled with medication   . Stroke [facility])    two previous strokes, 2005, 2011     Past Surgical History:  Procedure Laterality Date  . ABDOMINAL HYSTERECTOMY    .  APPENDECTOMY    . AQUEOUS SHUNT Right 05/06/2016   Procedure: AQUEOUS SHUNT;  Surgeon: Ronnell Freshwater, MD;  Location: [facility];  Service: Ophthalmology;  Laterality: Right;  SCLERAL PATCH GRAFT AHMED FP7  . EYE SURGERY     glucacoma numerous times    Family History:  Family History  Problem Relation Age of Onset  . Heart disease Father   . Stroke Daughter    Family Psychiatric  History: No family mental health history known at all Social  History:  History  Alcohol Use No     History  Drug Use No    Social History   Social History  . Marital status: Married    Spouse name: N/A  . Number of children: N/A  . Years of education: N/A   Social History Main Topics  . Smoking status: Never Smoker  . Smokeless tobacco: Never Used     Comment: quit over 45 yrs ago  . Alcohol use No  . Drug use: No  . Sexual activity: Not Asked   Other Topics Concern  . None   Social History Narrative  . None   Additional Social History:    Allergies:   Allergies  Allergen Reactions  . Codeine Nausea And Vomiting  . Sulfa Antibiotics Other (See Comments)    Unknown   . Penicillins Nausea Only    Labs:  Results for orders placed or performed during the hospital encounter of 08/02/16 (from the past 48 hour(s))  Comprehensive metabolic panel     Status: Abnormal   Collection Time: 08/02/16  3:57 PM  Result Value Ref Range   Sodium 137 135 - 145 mmol/L   Potassium 3.6 3.5 - 5.1 mmol/L   Chloride 101 101 - 111 mmol/L   CO2 28 22 - 32 mmol/L   Glucose, Bld 106 (H) 65 - 99 mg/dL   BUN 9 6 - 20 mg/dL   Creatinine, Ser 0.83 0.44 - 1.00 mg/dL   Calcium 9.2 8.9 - 10.3 mg/dL   Total Protein 7.5 6.5 - 8.1 g/dL   Albumin 3.2 (L) 3.5 - 5.0 g/dL   AST 29 15 - 41 U/L   ALT 11 (L) 14 - 54 U/L   Alkaline Phosphatase 78 38 - 126 U/L   Total Bilirubin 1.1 0.3 - 1.2 mg/dL   GFR calc non Af Amer >60 >60 mL/min   GFR calc Af Amer >60 >60 mL/min    Comment: (NOTE) The eGFR has been calculated using the CKD EPI equation. This calculation has not been validated in all clinical situations. eGFR's persistently <60 mL/min signify possible Chronic Kidney Disease.    Anion gap 8 5 - 15  Troponin I     Status: Abnormal   Collection Time: 08/02/16  3:57 PM  Result Value Ref Range   Troponin I 0.03 (HH) <0.03 ng/mL    Comment: CRITICAL RESULT CALLED TO, READ BACK BY AND VERIFIED WITH ELIZABETH GANNON AT 1658 08/02/2016 BY TFK.   CBC  with Differential     Status: Abnormal   Collection Time: 08/02/16  3:57 PM  Result Value Ref Range   WBC 6.5 3.6 - 11.0 K/uL   RBC 4.04 3.80 - 5.20 MIL/uL   Hemoglobin 12.3 12.0 - 16.0 g/dL   HCT 36.1 35.0 - 47.0 %   MCV 89.3 80.0 - 100.0 fL   MCH 30.4 26.0 - 34.0 pg   MCHC 34.1 32.0 - 36.0 g/dL   RDW 15.8 (H) 11.5 -  14.5 %   Platelets 371 150 - 440 K/uL   Neutrophils Relative % 64 %   Neutro Abs 4.2 1.4 - 6.5 K/uL   Lymphocytes Relative 17 %   Lymphs Abs 1.1 1.0 - 3.6 K/uL   Monocytes Relative 17 %   Monocytes Absolute 1.1 (H) 0.2 - 0.9 K/uL   Eosinophils Relative 2 %   Eosinophils Absolute 0.1 0 - 0.7 K/uL   Basophils Relative 0 %   Basophils Absolute 0.0 0 - 0.1 K/uL  Lactic acid, plasma     Status: None   Collection Time: 08/02/16  3:58 PM  Result Value Ref Range   Lactic Acid, Venous 1.4 0.5 - 1.9 mmol/L  Urinalysis, Complete w Microscopic     Status: Abnormal   Collection Time: 08/02/16  3:58 PM  Result Value Ref Range   Color, Urine YELLOW (A) YELLOW   APPearance CLEAR (A) CLEAR   Specific Gravity, Urine 1.011 1.005 - 1.030   pH 7.0 5.0 - 8.0   Glucose, UA NEGATIVE NEGATIVE mg/dL   Hgb urine dipstick NEGATIVE NEGATIVE   Bilirubin Urine NEGATIVE NEGATIVE   Ketones, ur NEGATIVE NEGATIVE mg/dL   Protein, ur NEGATIVE NEGATIVE mg/dL   Nitrite NEGATIVE NEGATIVE   Leukocytes, UA NEGATIVE NEGATIVE   RBC / HPF 0-5 0 - 5 RBC/hpf   WBC, UA 0-5 0 - 5 WBC/hpf   Bacteria, UA NONE SEEN NONE SEEN   Squamous Epithelial / LPF 0-5 (A) NONE SEEN   Mucous PRESENT    Hyaline Casts, UA PRESENT   Troponin I     Status: None   Collection Time: 08/02/16 10:15 PM  Result Value Ref Range   Troponin I <0.03 <0.03 ng/mL  Troponin I     Status: None   Collection Time: 08/03/16  4:22 AM  Result Value Ref Range   Troponin I <0.03 <0.03 ng/mL  Basic metabolic panel     Status: Abnormal   Collection Time: 08/03/16  4:22 AM  Result Value Ref Range   Sodium 136 135 - 145 mmol/L    Potassium 3.1 (L) 3.5 - 5.1 mmol/L   Chloride 105 101 - 111 mmol/L   CO2 25 22 - 32 mmol/L   Glucose, Bld 93 65 - 99 mg/dL   BUN 11 6 - 20 mg/dL   Creatinine, Ser 0.58 0.44 - 1.00 mg/dL   Calcium 8.3 (L) 8.9 - 10.3 mg/dL   GFR calc non Af Amer >60 >60 mL/min   GFR calc Af Amer >60 >60 mL/min    Comment: (NOTE) The eGFR has been calculated using the CKD EPI equation. This calculation has not been validated in all clinical situations. eGFR's persistently <60 mL/min signify possible Chronic Kidney Disease.    Anion gap 6 5 - 15  CBC     Status: Abnormal   Collection Time: 08/03/16  4:22 AM  Result Value Ref Range   WBC 5.1 3.6 - 11.0 K/uL   RBC 3.85 3.80 - 5.20 MIL/uL   Hemoglobin 11.7 (L) 12.0 - 16.0 g/dL   HCT 34.3 (L) 35.0 - 47.0 %   MCV 89.3 80.0 - 100.0 fL   MCH 30.4 26.0 - 34.0 pg   MCHC 34.1 32.0 - 36.0 g/dL   RDW 15.7 (H) 11.5 - 14.5 %   Platelets 324 150 - 440 K/uL  Troponin I     Status: None   Collection Time: 08/03/16  9:33 AM  Result Value Ref Range   Troponin I <  0.03 <0.03 ng/mL  Folate     Status: None   Collection Time: 08/03/16  2:29 PM  Result Value Ref Range   Folate 11.5 >5.9 ng/mL  Sedimentation rate     Status: Abnormal   Collection Time: 08/03/16  2:29 PM  Result Value Ref Range   Sed Rate 49 (H) 0 - 30 mm/hr  TSH     Status: None   Collection Time: 08/03/16  2:29 PM  Result Value Ref Range   TSH 2.922 0.350 - 4.500 uIU/mL    Comment: Performed by a 3rd Generation assay with a functional sensitivity of <=0.01 uIU/mL.    Current Facility-Administered Medications  Medication Dose Route Frequency Provider Last Rate Last Dose  . acetaminophen (TYLENOL) tablet 650 mg  650 mg Oral Q6H PRN Lance Coon, MD       Or  . acetaminophen (TYLENOL) suppository 650 mg  650 mg Rectal Q6H PRN Lance Coon, MD      . bisacodyl (DULCOLAX) suppository 10 mg  10 mg Rectal Daily Bettey Costa, MD   10 mg at 08/03/16 1104  . enoxaparin (LOVENOX) injection 40 mg  40 mg  Subcutaneous Q24H Lance Coon, MD   40 mg at 08/02/16 2257  . latanoprost (XALATAN) 0.005 % ophthalmic solution 1 drop  1 drop Both Eyes QHS Lance Coon, MD   1 drop at 08/02/16 2258  . ondansetron (ZOFRAN) tablet 4 mg  4 mg Oral Q6H PRN Lance Coon, MD       Or  . ondansetron [facility]) injection 4 mg  4 mg Intravenous Q6H PRN Lance Coon, MD      . polyethylene glycol (MIRALAX / GLYCOLAX) packet 17 g  17 g Oral Daily Bettey Costa, MD   17 g at 08/03/16 1104  . timolol (TIMOPTIC) 0.5 % ophthalmic solution 1 drop  1 drop Right Eye BID Lance Coon, MD   1 drop at 08/03/16 0905  . verapamil (CALAN-SR) CR tablet 180 mg  180 mg Oral BID Lance Coon, MD   180 mg at 08/03/16 0905    Musculoskeletal: Strength & Muscle Tone: decreased Gait & Station: unable to stand Patient leans: Backward  Psychiatric Specialty Exam: Physical Exam  Nursing note and vitals reviewed. Constitutional: She appears well-developed. She appears cachectic. She is uncooperative.  HENT:  Head: Normocephalic and atraumatic.  Eyes: Conjunctivae are normal. Pupils are equal, round, and reactive to light.  Neck: Normal range of motion.  Cardiovascular: Normal rate.   Respiratory: Effort normal. No respiratory distress.  GI: Soft.  Musculoskeletal: Normal range of motion.  Neurological: She is alert.  Skin: Skin is warm and dry.  Psychiatric: Her affect is blunt and inappropriate. Her speech is delayed. She is slowed and withdrawn. Cognition and memory are impaired. She expresses no suicidal ideation. She exhibits abnormal recent memory and abnormal remote memory.    Review of Systems  Unable to perform ROS: Mental status change    Blood pressure 116/69, pulse 86, temperature 98.2 F (36.8 C), temperature source Oral, resp. rate 18, height '5\' 1"'$  (1.549 m), weight 49.8 kg (109 lb 12.8 oz), SpO2 95 %.Body mass index is 20.75 kg/m.  General Appearance: Casual  Eye Contact:  None  Speech:  Garbled and Slow  Volume:   Decreased  Mood:  Euthymic  Affect:  Constricted  Thought Process:  Disorganized  Orientation:  Negative  Thought Content:  Illogical  Suicidal Thoughts:  No  Homicidal Thoughts:  No  Memory:  Immediate;  Fair Recent;   Poor Remote;   Poor  Judgement:  Poor  Insight:  Lacking  Psychomotor Activity:  Decreased  Concentration:  Concentration: Poor  Recall:  Poor  Fund of Knowledge:  Poor  Language:  Poor  Akathisia:  No  Handed:  Right  AIMS (if indicated):     Assets:  Housing Social Support  ADL's:  Impaired  Cognition:  Impaired,  Mild and Moderate  Sleep:        Treatment Plan Summary: Plan This is an 83 year old woman with an acute mental status change after a fall. No evidence of any brain injury or stroke. No evidence of any specific infection. The history I have is not very detailed or complete but it does not sound like she had obviously been having a depression prior to the fall couple days ago. It also sounds like she has a history of them feeling like she has poor tolerance of medications. The circumstances I do not think it is reasonable to start a new antidepressant medicine in the hospital. There would be a significant risk of side effects or complications from the medication and also a risk of confusion from possibly overestimating those side effects. Unclear whether she is likely to benefit from a medication. She is not suicidal. Antidepressant medication would not be likely to make any improvement in her outcome in the short-term. She will be following up with her primary care doctor and neurologist for outpatient treatment. Family understands my reasoning and is agreeable with the plan. I have communicated this to the hospitalist as well.  Disposition: Patient does not meet criteria for psychiatric inpatient admission. Supportive therapy provided about ongoing stressors.  Alethia Berthold, MD 08/03/2016 4:22 PM

## 2016-08-03 NOTE — Progress Notes (Signed)
*  PRELIMINARY RESULTS* Echocardiogram 2D Echocardiogram has been performed.  Karen Krueger 08/03/2016, 8:37 AM

## 2016-08-03 NOTE — Care Management Obs Status (Signed)
[place] NOTIFICATION   Patient Details  Name: Karen Krueger MRN: [id number] Date of Birth: 1931-11-01   Medicare Observation Status Notification Given:  No (discharge order less than 24 hours)    Ival Bible, RN 08/03/2016, 1:19 PM

## 2016-08-03 NOTE — Consult Note (Signed)
Reason for Consult:Altered mental status Referring Physician: Mody  CC: Altered mental status  HPI: Karen Krueger is an 83 y.o. female who has had a gradual decline over the past two years.  Currently lives with her sons.  Was diagnosed with a mild dementia about a year ago but has not been paying bills.  Unable to drive.  Memory poor.  For the past two weeks has not wanted to get out of the bed.  Has been made to get out of the bed to use the bathroom and eat.  Eats very little.  Has also had some hallucinations.  Had a fall on the day of admission and symptoms have been worse.   Was tried on Remeron prior to admission by her PCP but patient had side effects.   Patient's husband died in October 20, 2013 and she has been depressed since that time.  Past Medical History:  Diagnosis Date  . Blind left eye   . Cancer ([place])    previous skin cancer   . Glaucoma    blind in Left eye   . Hypertension    controlled with medication   . Stroke [facility])    two previous strokes, 2005, 2011     Past Surgical History:  Procedure Laterality Date  . ABDOMINAL HYSTERECTOMY    . APPENDECTOMY    . AQUEOUS SHUNT Right 05/06/2016   Procedure: AQUEOUS SHUNT;  Surgeon: Ronnell Freshwater, MD;  Location: [facility];  Service: Ophthalmology;  Laterality: Right;  SCLERAL PATCH GRAFT AHMED FP7  . EYE SURGERY     glucacoma numerous times     Family History  Problem Relation Age of Onset  . Heart disease Father   . Stroke Daughter     Social History:  reports that she has never smoked. She has never used smokeless tobacco. She reports that she does not drink alcohol or use drugs.  Allergies  Allergen Reactions  . Codeine Nausea And Vomiting  . Sulfa Antibiotics Other (See Comments)    Unknown   . Penicillins Nausea Only    Medications:  I have reviewed the patient's current medications. Prior to Admission:  Prescriptions Prior to Admission  Medication Sig Dispense Refill Last Dose  .  bimatoprost (LUMIGAN) 0.01 % SOLN Place 1 drop into the right eye at bedtime.   08/01/2016 at Unknown time  . latanoprost (XALATAN) 0.005 % ophthalmic solution Place 1 drop into both eyes at bedtime.   08/01/2016 at Unknown time  . levofloxacin (LEVAQUIN) 500 MG tablet Take 1 tablet by mouth daily.   08/02/2016 at Unknown time  . Timolol Maleate (ISTALOL) 0.5 % (DAILY) SOLN Place 1 drop into the right eye 2 (two) times daily.    08/02/2016 at Unknown time  . verapamil (CALAN-SR) 180 MG CR tablet Take 180 mg by mouth 2 (two) times daily.   08/02/2016 at Unknown time   Scheduled: . bisacodyl  10 mg Rectal Daily  . enoxaparin (LOVENOX) injection  40 mg Subcutaneous Q24H  . latanoprost  1 drop Both Eyes QHS  . polyethylene glycol  17 g Oral Daily  . timolol  1 drop Right Eye BID  . verapamil  180 mg Oral BID    ROS: History obtained from daughter  General ROS: negative for - chills, fatigue, fever, night sweats, weight gain or weight loss Psychological ROS: as noted in HPI Ophthalmic ROS: blind in left eye ENT ROS: negative for - epistaxis, nasal discharge, oral lesions, sore throat,  tinnitus or vertigo Allergy and Immunology ROS: negative for - hives or itchy/watery eyes Hematological and Lymphatic ROS: negative for - bleeding problems, bruising or swollen lymph nodes Endocrine ROS: negative for - galactorrhea, hair pattern changes, polydipsia/polyuria or temperature intolerance Respiratory ROS: negative for - cough, hemoptysis, shortness of breath or wheezing Cardiovascular ROS: negative for - chest pain, dyspnea on exertion, edema or irregular heartbeat Gastrointestinal ROS: negative for - abdominal pain, diarrhea, hematemesis, nausea/vomiting or stool incontinence Genito-Urinary ROS: negative for - dysuria, hematuria, incontinence or urinary frequency/urgency Musculoskeletal ROS: negative for - joint swelling or muscular weakness Neurological ROS: as noted in HPI Dermatological ROS:  negative for rash and skin lesion changes  Physical Examination: Blood pressure 116/69, pulse 86, temperature 98.2 F (36.8 C), temperature source Oral, resp. rate 18, height 5\' 1"  (1.549 m), weight 49.8 kg (109 lb 12.8 oz), SpO2 95 %.  HEENT-  Normocephalic, no lesions, without obvious abnormality.  Normal external eye and conjunctiva.  Normal TM's bilaterally.  Normal auditory canals and external ears. Normal external nose, mucus membranes and septum.  Normal pharynx. Cardiovascular- S1, S2 normal, pulses palpable throughout   Lungs- chest clear, no wheezing, rales, normal symmetric air entry Abdomen- soft, non-tender; bowel sounds normal; no masses,  no organomegaly Extremities- no edema Lymph-no adenopathy palpable Musculoskeletal-no joint tenderness, deformity or swelling Skin-warm and dry, no hyperpigmentation, vitiligo, or suspicious lesions  Neurological Examination Mental Status: Lying in bed with her eyes closed.  Answers questions appropriately initially but then answers trail off into unintelligible speech.  Follows three step commands with some minor reinforcement.  Speech dysarthric.   Cranial Nerves: II: Discs flat bilaterally; Poor vision, Right pupils round, reactive to light III,IV, VI: ptosis not present, extra-ocular motions intact bilaterally V,VII: smile symmetric, facial light touch sensation normal bilaterally VIII: hearing normal bilaterally IX,X: gag reflex present XI: bilateral shoulder shrug XII: midline tongue extension Motor: Right : Upper extremity   5/5    Left:     Upper extremity   5/5  Lower extremity   5/5     Lower extremity   5/5 Tone and bulk:normal tone throughout; no atrophy noted Sensory: Describes patchy sensation loss Deep Tendon Reflexes: 2+ and symmetric with absent AJ;s bilaterally Plantars: Right: downgoing   Left: downgoing Cerebellar: Normal finger-to-nose and normal heel-to-shin testing bilaterally Gait: not tested due to safety  concerns although patient able to stand and transfer     Laboratory Studies:   Basic Metabolic Panel:  Recent Labs Lab 08/02/16 1557 08/03/16 0422  NA 137 136  K 3.6 3.1*  CL 101 105  CO2 28 25  GLUCOSE 106* 93  BUN 9 11  CREATININE 0.83 0.58  CALCIUM 9.2 8.3*    Liver Function Tests:  Recent Labs Lab 08/02/16 1557  AST 29  ALT 11*  ALKPHOS 78  BILITOT 1.1  PROT 7.5  ALBUMIN 3.2*   No results for input(s): LIPASE, AMYLASE in the last 168 hours. No results for input(s): AMMONIA in the last 168 hours.  CBC:  Recent Labs Lab 08/02/16 1557 08/03/16 0422  WBC 6.5 5.1  NEUTROABS 4.2  --   HGB 12.3 11.7*  HCT 36.1 34.3*  MCV 89.3 89.3  PLT 371 324    Cardiac Enzymes:  Recent Labs Lab 08/02/16 1557 08/02/16 2215 08/03/16 0422 08/03/16 0933  TROPONINI 0.03* <0.03 <0.03 <0.03    BNP: Invalid input(s): POCBNP  CBG: No results for input(s): GLUCAP in the last 168 hours.  Microbiology: Results for  orders placed or performed during the hospital encounter of 06/04/16  Culture, blood (routine x 2)     Status: None   Collection Time: 06/04/16 12:57 PM  Result Value Ref Range Status   Specimen Description BLOOD R WRIST  Final   Special Requests   Final    BOTTLES DRAWN AEROBIC AND ANAEROBIC AER 8ML ANA 9ML   Culture NO GROWTH 5 DAYS  Final   Report Status 06/09/2016 FINAL  Final  Culture, blood (routine x 2)     Status: None   Collection Time: 06/04/16 12:58 PM  Result Value Ref Range Status   Specimen Description BLOOD R AC  Final   Special Requests   Final    BOTTLES DRAWN AEROBIC AND ANAEROBIC AER 8ML ANA 7ML   Culture NO GROWTH 5 DAYS  Final   Report Status 06/09/2016 FINAL  Final    Coagulation Studies: No results for input(s): LABPROT, INR in the last 72 hours.  Urinalysis:  Recent Labs Lab 08/02/16 1558  COLORURINE YELLOW*  LABSPEC 1.011  PHURINE 7.0  GLUCOSEU NEGATIVE  HGBUR NEGATIVE  BILIRUBINUR NEGATIVE  KETONESUR NEGATIVE   PROTEINUR NEGATIVE  NITRITE NEGATIVE  LEUKOCYTESUR NEGATIVE    Lipid Panel:     Component Value Date/Time   CHOL 158 07/14/2014 0446   TRIG 95 07/14/2014 0446   HDL 50 07/14/2014 0446   VLDL 19 07/14/2014 0446   LDLCALC 89 07/14/2014 0446    HgbA1C:  Lab Results  Component Value Date   HGBA1C 5.5 07/14/2014    Urine Drug Screen:  No results found for: LABOPIA, COCAINSCRNUR, LABBENZ, AMPHETMU, THCU, LABBARB  Alcohol Level: No results for input(s): ETH in the last 168 hours.  Other results: EKG: sinus rhythm at 82 bpm.  Imaging: Ct Head Wo Contrast  Result Date: 08/02/2016 CLINICAL DATA:  Head injury after fall in bathroom last night. EXAM: CT HEAD WITHOUT CONTRAST CT CERVICAL SPINE WITHOUT CONTRAST TECHNIQUE: Multidetector CT imaging of the head and cervical spine was performed following the standard protocol without intravenous contrast. Multiplanar CT image reconstructions of the cervical spine were also generated. COMPARISON:  CT scan of July 13, 2014. FINDINGS: CT HEAD FINDINGS Brain: Mild chronic ischemic white matter disease is noted. Old thalamic infarction is noted. No mass effect or midline shift is noted. Ventricular size is within normal limits. There is no evidence of mass lesion, hemorrhage or acute infarction. Vascular: Atherosclerosis of carotid siphons is noted. Skull: Normal. Negative for fracture or focal lesion. Sinuses/Orbits: No acute finding. Other: None. CT CERVICAL SPINE FINDINGS Alignment: Reversal of normal lordosis of cervical spine is noted which most likely is positional in origin. No spondylolisthesis is noted. Skull base and vertebrae: No acute fracture. No primary bone lesion or focal pathologic process. Soft tissues and spinal canal: No prevertebral fluid or swelling. No visible canal hematoma. Disc levels: Moderate degenerative disc disease is noted at C5-6 and C6-7. Upper chest: Negative. Other: None. IMPRESSION: Mild chronic ischemic white matter  disease. No acute intracranial abnormality seen. Moderate degenerative disc disease. No acute abnormality seen in the cervical spine. Electronically Signed   By: Marijo Conception, M.D.   On: 08/02/2016 16:46   Ct Cervical Spine Wo Contrast  Result Date: 08/02/2016 CLINICAL DATA:  Head injury after fall in bathroom last night. EXAM: CT HEAD WITHOUT CONTRAST CT CERVICAL SPINE WITHOUT CONTRAST TECHNIQUE: Multidetector CT imaging of the head and cervical spine was performed following the standard protocol without intravenous contrast. Multiplanar CT image  reconstructions of the cervical spine were also generated. COMPARISON:  CT scan of July 13, 2014. FINDINGS: CT HEAD FINDINGS Brain: Mild chronic ischemic white matter disease is noted. Old thalamic infarction is noted. No mass effect or midline shift is noted. Ventricular size is within normal limits. There is no evidence of mass lesion, hemorrhage or acute infarction. Vascular: Atherosclerosis of carotid siphons is noted. Skull: Normal. Negative for fracture or focal lesion. Sinuses/Orbits: No acute finding. Other: None. CT CERVICAL SPINE FINDINGS Alignment: Reversal of normal lordosis of cervical spine is noted which most likely is positional in origin. No spondylolisthesis is noted. Skull base and vertebrae: No acute fracture. No primary bone lesion or focal pathologic process. Soft tissues and spinal canal: No prevertebral fluid or swelling. No visible canal hematoma. Disc levels: Moderate degenerative disc disease is noted at C5-6 and C6-7. Upper chest: Negative. Other: None. IMPRESSION: Mild chronic ischemic white matter disease. No acute intracranial abnormality seen. Moderate degenerative disc disease. No acute abnormality seen in the cervical spine. Electronically Signed   By: Marijo Conception, M.D.   On: 08/02/2016 16:46   Mr Jeri Cos And Wo Contrast  Result Date: 08/02/2016 CLINICAL DATA:  Recent fall.  Hallucinations. EXAM: MRI HEAD WITHOUT AND  WITH CONTRAST TECHNIQUE: Multiplanar, multiecho pulse sequences of the brain and surrounding structures were obtained without and with intravenous contrast. CONTRAST:  56mL MULTIHANCE GADOBENATE DIMEGLUMINE 529 MG/ML IV SOLN COMPARISON:  CT head earlier today.  MR head 07/14/2014. FINDINGS: The patient was unable to remain motionless for the exam. Small or subtle lesions could be overlooked. Brain: No acute infarction, hemorrhage, extra-axial collection or mass lesion. Moderate ventriculomegaly. No dramatic cortical atrophy. Prominence of the basal cisterns. Extensive white matter signal abnormality, both focal and confluent, favored to represent chronic microvascular ischemic change rather than transependymal absorption from normal pressure hydrocephalus. Post infusion, no abnormal enhancement. Vascular: Normal flow voids.  Major dural venous sinuses are patent. Skull and upper cervical spine: Normal marrow signal. Sinuses/Orbits: No layering sinus fluid. BILATERAL cataract extraction. Other: None. Compared with 2015, similar appearance. IMPRESSION: Ventriculomegaly, extensive white matter disease, and basilar cistern prominence favored to represent chronic microvascular ischemic change. No acute intracranial findings. Electronically Signed   By: Staci Righter M.D.   On: 08/02/2016 20:10   Dg Chest Portable 1 View  Result Date: 08/02/2016 CLINICAL DATA:  [place] last night in the bathroom, striking the head. Behavioral changes. EXAM: PORTABLE CHEST 1 VIEW COMPARISON:  11/03/2014 FINDINGS: Heart size is normal. Chronic aortic atherosclerosis. The lungs are clear. The vascularity is normal. Old right third rib fracture. No acute chest finding. IMPRESSION: No active disease. Electronically Signed   By: Nelson Chimes M.D.   On: 08/02/2016 15:57     Assessment/Plan: 83 year old female with altered mental status.  History suggests a progressive dementia.  She may have had some precipitous worsening that may have  been brought about by her recent fall but she has clearly been progressing even prior to the fall.  MRI of the brain reviewed and shows no acute changes.  Depression may be playing some part in her progression as well.    Recommendations: 1.  EEG.  May be performed as an outpatient 2.  B12, folate, TSH, RPR 3.  Psych to evaluate 4.  Patient to follow up with Dr. Manuella Ghazi as an outpatient   Alexis Goodell, MD Neurology [phone] 08/03/2016, 1:31 PM

## 2016-08-04 LAB — URINE CULTURE: CULTURE: NO GROWTH

## 2016-08-04 LAB — VITAMIN B12: Vitamin B-12: 506 pg/mL (ref 180–914)

## 2016-08-04 LAB — RPR: RPR Ser Ql: NONREACTIVE

## 2016-08-05 MED FILL — Timolol Maleate Ophth Soln 0.5%: OPHTHALMIC | Qty: 5 | Status: AC

## 2016-08-12 ENCOUNTER — Emergency Department: Payer: Medicare Other

## 2016-08-12 ENCOUNTER — Emergency Department
Admission: EM | Admit: 2016-08-12 | Discharge: 2016-08-12 | Disposition: A | Discharge status: Home / Self Care | Payer: Medicare Other | Attending: Emergency Medicine | Admitting: Emergency Medicine

## 2016-08-12 DIAGNOSIS — M159 Polyosteoarthritis, unspecified: Secondary | ICD-10-CM | POA: Insufficient documentation

## 2016-08-12 DIAGNOSIS — Y939 Activity, unspecified: Secondary | ICD-10-CM | POA: Diagnosis not present

## 2016-08-12 DIAGNOSIS — M545 Low back pain: Secondary | ICD-10-CM | POA: Diagnosis not present

## 2016-08-12 DIAGNOSIS — F0390 Unspecified dementia without behavioral disturbance: Secondary | ICD-10-CM | POA: Diagnosis not present

## 2016-08-12 DIAGNOSIS — I10 Essential (primary) hypertension: Secondary | ICD-10-CM | POA: Insufficient documentation

## 2016-08-12 DIAGNOSIS — S8991XA Unspecified injury of right lower leg, initial encounter: Secondary | ICD-10-CM | POA: Diagnosis present

## 2016-08-12 DIAGNOSIS — Y929 Unspecified place or not applicable: Secondary | ICD-10-CM | POA: Insufficient documentation

## 2016-08-12 DIAGNOSIS — Z79899 Other long term (current) drug therapy: Secondary | ICD-10-CM | POA: Diagnosis not present

## 2016-08-12 DIAGNOSIS — W1839XA Other fall on same level, initial encounter: Secondary | ICD-10-CM | POA: Insufficient documentation

## 2016-08-12 DIAGNOSIS — Y999 Unspecified external cause status: Secondary | ICD-10-CM | POA: Diagnosis not present

## 2016-08-12 HISTORY — DX: Unspecified dementia, unspecified severity, without behavioral disturbance, psychotic disturbance, mood disturbance, and anxiety: F03.90

## 2016-08-12 MED ORDER — NAPROXEN 500 MG PO TABS: 500.0000 mg | ORAL_TABLET | Freq: Two times a day (BID) | ORAL | 2 refills | Status: DC

## 2016-08-12 NOTE — Care Management Note (Signed)
Case Management Note  Patient Details  Name: Aretina Fingerhut MRN: [id number] Date of Birth: 04-Aug-1931  Subjective/Objective:    RN for the patient ahs asked me to cancel the [facility] consult that we made because he has someone coming to see the patient and help at home from a Hospice agency. Called [facility] and cancelled referral.                Action/Plan:   Expected Discharge Date:                  Expected Discharge Plan:     In-House Referral:     Discharge planning Services  CM Consult  Post Acute Care Choice:    Choice offered to:     DME Arranged:    DME Agency:     HH Arranged:    [place] Agency:  [facility] [employer]  Status of Service:     If discussed at [employer], dates discussed:    Additional Comments:  Beau Fanny, RN 08/12/2016, 1:35 PM

## 2016-08-12 NOTE — ED Provider Notes (Signed)
[facility] Emergency Department Provider Note   ____________________________________________    I have reviewed the triage vital signs and the nursing notes.   HISTORY  Chief Complaint Fall     HPI Karen Krueger is a 83 y.o. female who presents after reported fall. Patient has a history of dementia and is living with her 2 sons who care for her. Reportedly she fell several days ago but was able to ambulate easily afterwards but since then has had difficulty getting around. Daughter is here with patient and says they're having difficulty caring for her. Patient initially complained of bilateral knee pain and low back pain   Past Medical History:  Diagnosis Date  . Blind left eye   . Cancer ([place])    previous skin cancer   . Dementia   . Glaucoma    blind in Left eye   . Hypertension    controlled with medication   . Stroke [facility])    two previous strokes, 2005, 2011     Patient Active Problem List   Diagnosis Date Noted  . Dementia due to Alzheimer's disease 08/03/2016  . Fall 08/02/2016  . Acute encephalopathy 08/02/2016  . HTN (hypertension) 08/02/2016  . Glaucoma 08/02/2016  . Elevated troponin 08/02/2016    Past Surgical History:  Procedure Laterality Date  . ABDOMINAL HYSTERECTOMY    . APPENDECTOMY    . AQUEOUS SHUNT Right 05/06/2016   Procedure: AQUEOUS SHUNT;  Surgeon: Ronnell Freshwater, MD;  Location: [facility];  Service: Ophthalmology;  Laterality: Right;  SCLERAL PATCH GRAFT AHMED FP7  . EYE SURGERY     glucacoma numerous times     Prior to Admission medications   Medication Sig Start Date End Date Taking? Authorizing Provider  bimatoprost (LUMIGAN) 0.01 % SOLN Place 1 drop into the right eye at bedtime.    Historical Provider, MD  latanoprost (XALATAN) 0.005 % ophthalmic solution Place 1 drop into both eyes at bedtime.    Historical Provider, MD  levofloxacin (LEVAQUIN) 500 MG tablet Take 1 tablet by  mouth daily. 07/26/16   Historical Provider, MD  naproxen (NAPROSYN) 500 MG tablet Take 1 tablet (500 mg total) by mouth 2 (two) times daily with a meal. 08/12/16   Lavonia Drafts, MD  polyethylene glycol (MIRALAX / Floria Raveling) packet Take 17 g by mouth daily. 08/03/16   Bettey Costa, MD  Timolol Maleate (ISTALOL) 0.5 % (DAILY) SOLN Place 1 drop into the right eye 2 (two) times daily.     Historical Provider, MD  verapamil (CALAN-SR) 180 MG CR tablet Take 180 mg by mouth 2 (two) times daily.    Historical Provider, MD     Allergies Codeine; Sulfa antibiotics; and Penicillins  Family History  Problem Relation Age of Onset  . Heart disease Father   . Stroke Daughter     Social History Social History  Substance Use Topics  . Smoking status: Never Smoker  . Smokeless tobacco: Never Used     Comment: quit over 45 yrs ago  . Alcohol use No    Review of Systems Limited by dementia  Constitutional: No dizziness  Cardiovascular: Denies chest pain. Respiratory: Denies shortness of breath. Gastrointestinal: No abdominal pain.    Musculoskeletal: As above Skin: Negative for laceration or abrasion Neurological: Negative for headaches or weakness  10-point ROS otherwise negative.  ____________________________________________   PHYSICAL EXAM:  VITAL SIGNS: ED Triage Vitals [08/12/16 1007]  Enc Vitals Group  BP 136/86     Pulse Rate 79     Resp 18     Temp 97.9 F (36.6 C)     Temp Source Oral     SpO2 96 %     Weight 114 lb (51.7 kg)     Height 5\' 2"  (1.575 m)     Head Circumference      Peak Flow      Pain Score      Pain Loc      Pain Edu?      Excl. in [place]?     Constitutional: Alert.No acute distress. Pleasant and interactive Eyes: Conjunctivae are normal.  Head: Atraumatic. Nose: No congestion/rhinnorhea. Mouth/Throat: Mucous membranes are moist.   Neck:  Painless ROM, No vertebral tenderness to palpation Cardiovascular: Normal rate, regular rhythm. Grossly normal  heart sounds.  Good peripheral circulation. Respiratory: Normal respiratory effort.  No retractions. Lungs CTAB. Gastrointestinal: Soft and nontender. No distention.  No CVA tenderness. Genitourinary: deferred Musculoskeletal: Normal strength in lower show these, bilateral mild knee swelling/effusion. Pain primarily in the right knee with flexion, no bony abnormalities  Warm and well perfused Neurologic:  Normal speech and language. No gross focal neurologic deficits are appreciated.  Skin:  Skin is warm, dry and intact. No rash noted. Psychiatric:  Speech and behavior are normal.  ____________________________________________   LABS (all labs ordered are listed, but only abnormal results are displayed)  Labs Reviewed - No data to display ____________________________________________  EKG  None ____________________________________________  RADIOLOGY  No acute fractures on x-rays, suspicious for osteoarthritis  ____________________________________________   PROCEDURES  Procedure(s) performed: No    Critical Care performed: No ____________________________________________   INITIAL IMPRESSION / ASSESSMENT AND PLAN / ED COURSE  Pertinent labs & imaging results that were available during my care of the patient were reviewed by me and considered in my medical decision making (see chart for details).  Patient presents after a fall, x-rays are reassuring. Family is having difficulty caring for the patient. Case manager has seen the patient and family and is arranging for home health. I have asked for them to follow up with orthopedics    ____________________________________________   FINAL CLINICAL IMPRESSION(S) / ED DIAGNOSES  Final diagnoses:  Osteoarthritis of multiple joints, unspecified osteoarthritis type      NEW MEDICATIONS STARTED DURING THIS VISIT:  New Prescriptions   NAPROXEN (NAPROSYN) 500 MG TABLET    Take 1 tablet (500 mg total) by mouth 2 (two) times  daily with a meal.     Note:  This document was prepared using Dragon voice recognition software and may include unintentional dictation errors.    Lavonia Drafts, MD 08/12/16 1250

## 2016-08-12 NOTE — Care Management Note (Signed)
Case Management Note  Patient Details  Name: [name] MRN: [id number] Date of Birth: [birth year]  Subjective/Objective:            Patient who is blind, here with pain after fall. Set up with [facility]-[phone] for SN, CSW, PT, OT and aide services.        Action/Plan:   Expected Discharge Date:                  Expected Discharge Plan:     In-House Referral:     Discharge planning Services  CM Consult  Post Acute Care Choice:    Choice offered to:     DME Arranged:    DME Agency:     HH Arranged:    [place] Agency:  [facility] [employer]  Status of Service:     If discussed at [employer], dates discussed:    Additional Comments:  Beau Fanny, RN 08/12/2016, 12:38 PM

## 2016-08-12 NOTE — ED Triage Notes (Addendum)
Pt comes into the ED via EMS from home where the pt lives alone, reports pt fell Saturday and has been having lower back, hip and BL knee pain, pain is worse in the R knee.. Reports pt has a hx of dementia per daughter..the pt is oriented to self, disoriented to date and place.

## 2016-08-12 NOTE — ED Notes (Signed)
Helped pt to bathroom. 

## 2016-08-12 NOTE — ED Notes (Signed)
Patient transported to X-ray 

## 2016-08-12 NOTE — ED Notes (Signed)
Pt returned from xray. Pt daughter is at the bedside and states the pt does not live alone, states 2 of her sons stay with her, states she fell in the bathroom on Thursday night. The daughter states the pt just recently had a change in her mental status 2 weeks ago when here with confusion.

## 2016-08-12 NOTE — ED Notes (Signed)
Per pt daughter, they has set up something with hospice and want to cancel the home health, Malachy Mood CM notified.

## 2016-11-24 ENCOUNTER — Encounter: Payer: Self-pay | Admitting: Emergency Medicine

## 2016-11-24 ENCOUNTER — Emergency Department: Payer: Medicare Other

## 2016-11-24 ENCOUNTER — Emergency Department
Admission: EM | Admit: 2016-11-24 | Discharge: 2016-11-25 | Disposition: A | Discharge status: Home / Self Care | Payer: Medicare Other | Attending: Emergency Medicine | Admitting: Emergency Medicine

## 2016-11-24 DIAGNOSIS — M542 Cervicalgia: Secondary | ICD-10-CM | POA: Insufficient documentation

## 2016-11-24 DIAGNOSIS — R109 Unspecified abdominal pain: Secondary | ICD-10-CM

## 2016-11-24 DIAGNOSIS — Y999 Unspecified external cause status: Secondary | ICD-10-CM | POA: Insufficient documentation

## 2016-11-24 DIAGNOSIS — Y939 Activity, unspecified: Secondary | ICD-10-CM | POA: Diagnosis not present

## 2016-11-24 DIAGNOSIS — W19XXXA Unspecified fall, initial encounter: Secondary | ICD-10-CM | POA: Diagnosis not present

## 2016-11-24 DIAGNOSIS — Z79899 Other long term (current) drug therapy: Secondary | ICD-10-CM | POA: Diagnosis not present

## 2016-11-24 DIAGNOSIS — R4020 Unspecified coma: Secondary | ICD-10-CM | POA: Diagnosis not present

## 2016-11-24 DIAGNOSIS — S199XXA Unspecified injury of neck, initial encounter: Secondary | ICD-10-CM | POA: Diagnosis present

## 2016-11-24 DIAGNOSIS — Y92031 Bathroom in apartment as the place of occurrence of the external cause: Secondary | ICD-10-CM | POA: Diagnosis not present

## 2016-11-24 DIAGNOSIS — K529 Noninfective gastroenteritis and colitis, unspecified: Secondary | ICD-10-CM | POA: Insufficient documentation

## 2016-11-24 DIAGNOSIS — I10 Essential (primary) hypertension: Secondary | ICD-10-CM | POA: Insufficient documentation

## 2016-11-24 LAB — CBC WITH DIFFERENTIAL/PLATELET
Basophils Absolute: 0 10*3/uL (ref 0–0.1)
Basophils Relative: 1 %
EOS ABS: 0.1 10*3/uL (ref 0–0.7)
EOS PCT: 2 %
HCT: 37.6 % (ref 35.0–47.0)
Hemoglobin: 13 g/dL (ref 12.0–16.0)
LYMPHS ABS: 2.3 10*3/uL (ref 1.0–3.6)
LYMPHS PCT: 45 %
MCH: 31.5 pg (ref 26.0–34.0)
MCHC: 34.5 g/dL (ref 32.0–36.0)
MCV: 91.3 fL (ref 80.0–100.0)
MONO ABS: 0.8 10*3/uL (ref 0.2–0.9)
MONOS PCT: 16 %
Neutro Abs: 1.9 10*3/uL (ref 1.4–6.5)
Neutrophils Relative %: 36 %
PLATELETS: 191 10*3/uL (ref 150–440)
RBC: 4.12 MIL/uL (ref 3.80–5.20)
RDW: 14.1 % (ref 11.5–14.5)
WBC: 5.2 10*3/uL (ref 3.6–11.0)

## 2016-11-24 LAB — COMPREHENSIVE METABOLIC PANEL
ALT: 18 U/L (ref 14–54)
ANION GAP: 8 (ref 5–15)
AST: 34 U/L (ref 15–41)
Albumin: 3.7 g/dL (ref 3.5–5.0)
Alkaline Phosphatase: 57 U/L (ref 38–126)
BUN: 19 mg/dL (ref 6–20)
CALCIUM: 8.9 mg/dL (ref 8.9–10.3)
CHLORIDE: 103 mmol/L (ref 101–111)
CO2: 26 mmol/L (ref 22–32)
Creatinine, Ser: 0.68 mg/dL (ref 0.44–1.00)
Glucose, Bld: 150 mg/dL — ABNORMAL HIGH (ref 65–99)
POTASSIUM: 3.5 mmol/L (ref 3.5–5.1)
SODIUM: 137 mmol/L (ref 135–145)
TOTAL PROTEIN: 7.8 g/dL (ref 6.5–8.1)
Total Bilirubin: 1.3 mg/dL — ABNORMAL HIGH (ref 0.3–1.2)

## 2016-11-24 LAB — TROPONIN I: Troponin I: <0.03 ng/mL (ref <0.03)

## 2016-11-24 LAB — LIPASE, BLOOD: LIPASE: 22 U/L (ref 11–51)

## 2016-11-24 MED ORDER — SODIUM CHLORIDE 0.9 % IV BOLUS (SEPSIS)
500.0000 mL | Freq: Once | INTRAVENOUS | Status: AC
  Administered 2016-11-24: 500 mL via INTRAVENOUS

## 2016-11-24 MED ORDER — IOPAMIDOL (ISOVUE-300) INJECTION 61%
30.0000 mL | Freq: Once | INTRAVENOUS | Status: AC | PRN
  Administered 2016-11-24: 30 mL via ORAL

## 2016-11-24 MED ORDER — IOPAMIDOL (ISOVUE-300) INJECTION 61%
100.0000 mL | Freq: Once | INTRAVENOUS | Status: AC | PRN
  Administered 2016-11-24: 100 mL via INTRAVENOUS

## 2016-11-24 NOTE — ED Notes (Signed)
Patient transported to CT via stretcher by CT tech.

## 2016-11-24 NOTE — ED Notes (Signed)
Pt returned to room from CT

## 2016-11-24 NOTE — ED Triage Notes (Signed)
Pt to rm 4 via EMS, EMS report witnessed fall in bathroom, pt hypotensive at scene w/ sbp 70s.  Pt also had loose BM w/ EMS and c/o abd pain.  Pt now c/o mild back pain.  EDP at bedside, neuro exam WNL.

## 2016-11-24 NOTE — ED Provider Notes (Signed)
[facility] Emergency Department Provider Note  ____________________________________________   First MD Initiated Contact with Patient 11/24/16 2213     (approximate)  I have reviewed the triage vital signs and the nursing notes.   HISTORY  Chief Complaint Fall and Hypotension   HPI Karen Krueger is a 83 y.o. female with a history of dementia who had a fall the bathroom tonight. The falls unwitnessed but it was reported by EMS there was likely a period of unconsciousness after the fall. Family also reports a period of unresponsiveness but unsure if the patient hasn't really lost consciousness. At this time the patient has no complaints. No nausea vomiting or diarrhea. Patient says that she has not felt ill at all recently. Unclear if the patient was urinating or moving her bowels at that time but the family thinks that she did not make it to the toilet and fell beforehand. Additionally, per EMS the patient had an initial blood pressure of 70 systolic.   Past Medical History:  Diagnosis Date  . Blind left eye   . Cancer ([place])    previous skin cancer   . Dementia   . Glaucoma    blind in Left eye   . Hypertension    controlled with medication   . Stroke [facility])    two previous strokes, 2005, 2011     Patient Active Problem List   Diagnosis Date Noted  . Dementia due to Alzheimer's disease 08/03/2016  . Fall 08/02/2016  . Acute encephalopathy 08/02/2016  . HTN (hypertension) 08/02/2016  . Glaucoma 08/02/2016  . Elevated troponin 08/02/2016    Past Surgical History:  Procedure Laterality Date  . ABDOMINAL HYSTERECTOMY    . APPENDECTOMY    . AQUEOUS SHUNT Right 05/06/2016   Procedure: AQUEOUS SHUNT;  Surgeon: Ronnell Freshwater, MD;  Location: [place];  Service: Ophthalmology;  Laterality: Right;  SCLERAL PATCH GRAFT AHMED FP7  . EYE SURGERY     glucacoma numerous times     Prior to Admission medications   Medication  Sig Start Date End Date Taking? Authorizing Provider  bimatoprost (LUMIGAN) 0.01 % SOLN Place 1 drop into the right eye at bedtime.    [provider]  latanoprost (XALATAN) 0.005 % ophthalmic solution Place 1 drop into both eyes at bedtime.    [provider]  levofloxacin (LEVAQUIN) 500 MG tablet Take 1 tablet by mouth daily. 07/26/16   [provider]  naproxen (NAPROSYN) 500 MG tablet Take 1 tablet (500 mg total) by mouth 2 (two) times daily with a meal. 08/12/16   Lavonia Drafts, MD  polyethylene glycol (MIRALAX / Floria Raveling) packet Take 17 g by mouth daily. 08/03/16   Bettey Costa, MD  Timolol Maleate (ISTALOL) 0.5 % (DAILY) SOLN Place 1 drop into the right eye 2 (two) times daily.     [provider]  verapamil (CALAN-SR) 180 MG CR tablet Take 180 mg by mouth 2 (two) times daily.    [provider]    Allergies Codeine; Sulfa antibiotics; and Penicillins  Family History  Problem Relation Age of Onset  . Heart disease Father   . Stroke Daughter     Social History Social History  Substance Use Topics  . Smoking status: Never Smoker  . Smokeless tobacco: Never Used     Comment: quit over 45 yrs ago  . Alcohol use No    Review of Systems  Constitutional: No fever/chills Eyes: No visual changes. ENT:  No sore throat. Cardiovascular: Denies chest pain. Respiratory: Denies shortness of breath. Gastrointestinal: No abdominal pain.  No nausea, no vomiting.  No diarrhea.  No constipation. Genitourinary: Negative for dysuria. Musculoskeletal: Negative for back pain. Skin: Negative for rash. Neurological: Negative for headaches, focal weakness or numbness.   ____________________________________________   PHYSICAL EXAM:  VITAL SIGNS: ED Triage Vitals  Enc Vitals Group     BP 11/24/16 2212 (!) 119/99     Pulse Rate 11/24/16 2212 63     Resp 11/24/16 2212 18     Temp 11/24/16 2212 97.7 F (36.5 C)     Temp Source 11/24/16 2212 Oral       SpO2 11/24/16 2212 99 %     Weight 11/24/16 2212 130 lb (59 kg)     Height 11/24/16 2212 5\' 2"  (1.575 m)     Head Circumference --      Peak Flow --      Pain Score 11/24/16 2211 2     Pain Loc --      Pain Edu? --      Excl. in [place]? --     Constitutional: Alert and oriented. Well appearing and in no acute distress. Eyes: Conjunctivae are normal.  Head: Atraumatic. Nose: No congestion/rhinnorhea. Mouth/Throat: Mucous membranes are moist.  Oropharynx non-erythematous. Neck: No stridor.  Ranges head and neck without any obvious restriction or distress. Cardiovascular: Normal rate, regular rhythm. Grossly normal heart sounds.  Good peripheral circulation. Respiratory: Normal respiratory effort.  No retractions. Lungs CTAB. Gastrointestinal: Soft with mild lower abdominal tenderness palpation. No rebound or guarding No distention.  Musculoskeletal: No lower extremity tenderness nor edema.  No joint effusions.  Pelvis is stable to rock. 5 out of 5 strength to all 4 extremities. Neurologic:  Normal speech and language. No gross focal neurologic deficits are appreciated.  Skin:  Skin is warm, dry and intact. No rash noted. Psychiatric: Mood and affect are normal. Speech and behavior are normal.  ____________________________________________   LABS (all labs ordered are listed, but only abnormal results are displayed)  Labs Reviewed  COMPREHENSIVE METABOLIC PANEL - Abnormal; Notable for the following:       Result Value   Glucose, Bld 150 (*)    Total Bilirubin 1.3 (*)    All other components within normal limits  CBC WITH DIFFERENTIAL/PLATELET  LIPASE, BLOOD  TROPONIN I  URINALYSIS, COMPLETE (UACMP) WITH MICROSCOPIC  TROPONIN I   ____________________________________________  EKG  ED ECG REPORT I, Doran Stabler, the attending physician, personally viewed and interpreted this ECG.   Date: 11/24/2016  EKG Time: 2217  Rate: 60  Rhythm: normal sinus rhythm  Axis:  Normal  Intervals: Prolonged PR interval  ST&T Change: No ST segment elevation or depression. No abnormal T-wave inversion.  ____________________________________________  RADIOLOGY  Pending ____________________________________________   PROCEDURES  Procedure(s) performed:   Procedures  Critical Care performed:   ____________________________________________   INITIAL IMPRESSION / ASSESSMENT AND PLAN / ED COURSE  Pertinent labs & imaging results that were available during my care of the patient were reviewed by me and considered in my medical decision making (see chart for details).  ----------------------------------------- 11:11 PM on 11/24/2016 -----------------------------------------  Patient complained of neck pain to the CT tech. We will proceed with a CT of the cervical spine. Pending imaging at this time. Possible vasovagal episode. Labs as well as vital signs in the emergency department here have been reassuring. We'll continue to observe the patient. She will receive  a fluid bolus. We will also recheck a second troponin. Signed out to Dr. Dahlia Client. Family is also where the plan for further observation the emergency department.      ____________________________________________   FINAL CLINICAL IMPRESSION(S) / ED DIAGNOSES  Fall. Neck pain and abdominal pain.    NEW MEDICATIONS STARTED DURING THIS VISIT:  New Prescriptions   No medications on file     Note:  This document was prepared using Dragon voice recognition software and may include unintentional dictation errors.    Orbie Pyo, MD 11/24/16 [phone]

## 2016-11-25 LAB — URINALYSIS, COMPLETE (UACMP) WITH MICROSCOPIC
Bacteria, UA: NONE SEEN
Bilirubin Urine: NEGATIVE
Glucose, UA: NEGATIVE mg/dL
Hgb urine dipstick: NEGATIVE
Ketones, ur: NEGATIVE mg/dL
Leukocytes, UA: NEGATIVE
Nitrite: NEGATIVE
PH: 6 (ref 5.0–8.0)
PROTEIN: NEGATIVE mg/dL
Specific Gravity, Urine: 1.02 (ref 1.005–1.030)
Squamous Epithelial / LPF: NONE SEEN

## 2016-11-25 LAB — TROPONIN I: Troponin I: <0.03 ng/mL (ref <0.03)

## 2016-11-25 MED ORDER — METRONIDAZOLE 500 MG PO TABS: 500.0000 mg | ORAL_TABLET | Freq: Two times a day (BID) | ORAL | 0 refills | Status: AC

## 2016-11-25 MED ORDER — CIPROFLOXACIN HCL 500 MG PO TABS: 500.0000 mg | ORAL_TABLET | Freq: Two times a day (BID) | ORAL | 0 refills | Status: AC

## 2016-11-25 NOTE — ED Provider Notes (Signed)
-----------------------------------------   2:23 AM on 11/25/2016 -----------------------------------------   Blood pressure 119/65, pulse (!) 52, temperature 97.7 F (36.5 C), temperature source Oral, resp. rate 20, height 5\' 2"  (1.575 m), weight 130 lb (59 kg), SpO2 96 %.  Assuming care from Dr. Clearnce Hasten.  In short, Karen Krueger is a 83 y.o. female with a chief complaint of Fall and Hypotension .  Refer to the original H&P for additional details.  The current plan of care is to follow up the results of the CT scan, urinalysis and repeat troponin.  The patient's CT scan shows an incidental finding of a vitreous hemorrhage in the left globe but the patient is legally blind in the left eye and the patient also has some subacute rib fractures. The family reports that they do not remember any rib fractures from previous falls but the patient is not having pain at this time. The patient's family also stated that she did stand up quickly when she had her episode. She likely has some dehydration with orthostasis or some vasovagal syncope. I did encourage the patient to hydrate herself at home. The patient also has some mild colitis I will treat her with some antibiotics. The patient will be discharged home to follow-up with her primary care physician.  Clinical Course as of Nov 25 221  Mon Nov 25, 2016  0050 1. No acute intracranial abnormalities. Chronic atrophy and small vessel ischemic changes. 2. **An incidental finding of potential clinical significance has been found. Small vitreous hemorrhage in the left globe. ** 3. Nonspecific reversal of the usual cervical lordosis, similar to previous study. Degenerative changes in the cervical spine. No acute displaced fractures identified. 4. **An incidental finding of potential clinical significance has been found. Possible 5 mm nodule in the right lung apex, incompletely included within the field of view. No follow-up needed if patient is  low-risk. Non-contrast chest CT can be considered in 12 months if patient is high-risk. This recommendation follows the consensus statement: Guidelines for Management of Incidental Pulmonary Nodules Detected on CT Images: From the Fleischner Society 2017; Radiology 2017; 284:228-243. **   CT Head Wo Contrast [AW]  0051 Mild enterocolitis without complication.  Multiple subacute LEFT rib fractures.   CT Abdomen Pelvis W Contrast [AW]  1694 Borderline cardiomegaly.  Lungs remain grossly clear. DG Chest 1 View [AW]    Clinical Course User Index [AW] Loney Hering, MD      Loney Hering, MD 11/25/16 [phone]

## 2016-11-25 NOTE — Discharge Instructions (Signed)
Please ensure that she were taking in adequate fluids and please follow-up with your primary care physician.

## 2016-12-19 DIAGNOSIS — C4441 Basal cell carcinoma of skin of scalp and neck: Secondary | ICD-10-CM

## 2016-12-19 HISTORY — DX: Basal cell carcinoma of skin of scalp and neck: C44.41

## 2017-01-05 ENCOUNTER — Observation Stay
Admission: EM | Admit: 2017-01-05 | Discharge: 2017-01-06 | Disposition: A | Discharge status: Home / Self Care | Payer: Medicare Other | Attending: Internal Medicine | Admitting: Internal Medicine

## 2017-01-05 ENCOUNTER — Emergency Department: Payer: Medicare Other

## 2017-01-05 DIAGNOSIS — R410 Disorientation, unspecified: Secondary | ICD-10-CM

## 2017-01-05 DIAGNOSIS — H409 Unspecified glaucoma: Secondary | ICD-10-CM | POA: Insufficient documentation

## 2017-01-05 DIAGNOSIS — Z882 Allergy status to sulfonamides status: Secondary | ICD-10-CM | POA: Insufficient documentation

## 2017-01-05 DIAGNOSIS — R4182 Altered mental status, unspecified: Secondary | ICD-10-CM | POA: Diagnosis present

## 2017-01-05 DIAGNOSIS — F0150 Vascular dementia without behavioral disturbance: Secondary | ICD-10-CM | POA: Diagnosis not present

## 2017-01-05 DIAGNOSIS — Z8673 Personal history of transient ischemic attack (TIA), and cerebral infarction without residual deficits: Secondary | ICD-10-CM | POA: Diagnosis not present

## 2017-01-05 DIAGNOSIS — F05 Delirium due to known physiological condition: Secondary | ICD-10-CM | POA: Diagnosis not present

## 2017-01-05 DIAGNOSIS — Z791 Long term (current) use of non-steroidal anti-inflammatories (NSAID): Secondary | ICD-10-CM | POA: Insufficient documentation

## 2017-01-05 DIAGNOSIS — I10 Essential (primary) hypertension: Secondary | ICD-10-CM | POA: Diagnosis not present

## 2017-01-05 DIAGNOSIS — F0280 Dementia in other diseases classified elsewhere without behavioral disturbance: Secondary | ICD-10-CM | POA: Diagnosis not present

## 2017-01-05 DIAGNOSIS — Z880 Allergy status to penicillin: Secondary | ICD-10-CM | POA: Insufficient documentation

## 2017-01-05 DIAGNOSIS — H5462 Unqualified visual loss, left eye, normal vision right eye: Secondary | ICD-10-CM | POA: Insufficient documentation

## 2017-01-05 DIAGNOSIS — R1312 Dysphagia, oropharyngeal phase: Secondary | ICD-10-CM | POA: Insufficient documentation

## 2017-01-05 DIAGNOSIS — G309 Alzheimer's disease, unspecified: Principal | ICD-10-CM | POA: Insufficient documentation

## 2017-01-05 DIAGNOSIS — Z885 Allergy status to narcotic agent status: Secondary | ICD-10-CM | POA: Diagnosis not present

## 2017-01-05 DIAGNOSIS — Z85828 Personal history of other malignant neoplasm of skin: Secondary | ICD-10-CM | POA: Diagnosis not present

## 2017-01-05 DIAGNOSIS — Z79899 Other long term (current) drug therapy: Secondary | ICD-10-CM | POA: Insufficient documentation

## 2017-01-05 LAB — LIPASE, BLOOD: Lipase: 24 U/L (ref 11–51)

## 2017-01-05 LAB — COMPREHENSIVE METABOLIC PANEL
ALBUMIN: 4 g/dL (ref 3.5–5.0)
ALK PHOS: 51 U/L (ref 38–126)
ALT: 16 U/L (ref 14–54)
AST: 30 U/L (ref 15–41)
Anion gap: 7 (ref 5–15)
BUN: 13 mg/dL (ref 6–20)
CHLORIDE: 98 mmol/L — AB (ref 101–111)
CO2: 25 mmol/L (ref 22–32)
CREATININE: 0.57 mg/dL (ref 0.44–1.00)
Calcium: 9.4 mg/dL (ref 8.9–10.3)
GFR calc non Af Amer: >60 mL/min (ref >60)
GLUCOSE: 135 mg/dL — AB (ref 65–99)
Potassium: 3.6 mmol/L (ref 3.5–5.1)
SODIUM: 130 mmol/L — AB (ref 135–145)
Total Bilirubin: 1.3 mg/dL — ABNORMAL HIGH (ref 0.3–1.2)
Total Protein: 8.4 g/dL — ABNORMAL HIGH (ref 6.5–8.1)

## 2017-01-05 LAB — CBC
HCT: 36.8 % (ref 35.0–47.0)
HEMOGLOBIN: 12.5 g/dL (ref 12.0–16.0)
MCH: 31.3 pg (ref 26.0–34.0)
MCHC: 34.1 g/dL (ref 32.0–36.0)
MCV: 91.8 fL (ref 80.0–100.0)
PLATELETS: 265 10*3/uL (ref 150–440)
RBC: 4 MIL/uL (ref 3.80–5.20)
RDW: 14.7 % — ABNORMAL HIGH (ref 11.5–14.5)
WBC: 6.5 10*3/uL (ref 3.6–11.0)

## 2017-01-05 LAB — TROPONIN I: Troponin I: <0.03 ng/mL (ref <0.03)

## 2017-01-05 LAB — URINALYSIS, COMPLETE (UACMP) WITH MICROSCOPIC
BILIRUBIN URINE: NEGATIVE
Bacteria, UA: NONE SEEN
GLUCOSE, UA: NEGATIVE mg/dL
HGB URINE DIPSTICK: NEGATIVE
KETONES UR: NEGATIVE mg/dL
NITRITE: NEGATIVE
PH: 5 (ref 5.0–8.0)
Protein, ur: NEGATIVE mg/dL
Specific Gravity, Urine: 1.021 (ref 1.005–1.030)

## 2017-01-05 LAB — AMMONIA: AMMONIA: 15 umol/L (ref 9–35)

## 2017-01-05 MED ORDER — SODIUM CHLORIDE 0.9 % IV BOLUS (SEPSIS)
500.0000 mL | Freq: Once | INTRAVENOUS | Status: AC
  Administered 2017-01-05: 500 mL via INTRAVENOUS

## 2017-01-05 MED ORDER — LATANOPROST 0.005 % OP SOLN
1.0000 [drp] | Freq: Every day | OPHTHALMIC | Status: DC
  Administered 2017-01-05: 1 [drp] via OPHTHALMIC
  Filled 2017-01-05: qty 2.5

## 2017-01-05 MED ORDER — SODIUM CHLORIDE 0.9 % IV SOLN
INTRAVENOUS | Status: DC
  Administered 2017-01-05 – 2017-01-06 (×3): via INTRAVENOUS

## 2017-01-05 MED ORDER — ENOXAPARIN SODIUM 40 MG/0.4ML ~~LOC~~ SOLN: 40.0000 mg | SUBCUTANEOUS | Status: DC

## 2017-01-05 MED ORDER — SENNOSIDES-DOCUSATE SODIUM 8.6-50 MG PO TABS: 1.0000 | ORAL_TABLET | Freq: Every evening | ORAL | Status: DC | PRN

## 2017-01-05 MED ORDER — TIMOLOL MALEATE 0.5 % OP SOLN
1.0000 [drp] | Freq: Two times a day (BID) | OPHTHALMIC | Status: DC
  Administered 2017-01-05 – 2017-01-06 (×2): 1 [drp] via OPHTHALMIC
  Filled 2017-01-05: qty 5

## 2017-01-05 MED ORDER — METRONIDAZOLE IVPB CUSTOM: 250.0000 mg | Freq: Three times a day (TID) | INTRAVENOUS | Status: DC

## 2017-01-05 MED ORDER — ACETAMINOPHEN 650 MG RE SUPP: 650.0000 mg | Freq: Four times a day (QID) | RECTAL | Status: DC | PRN

## 2017-01-05 MED ORDER — ONDANSETRON HCL 4 MG/2ML IJ SOLN: 4.0000 mg | Freq: Four times a day (QID) | INTRAMUSCULAR | Status: DC | PRN

## 2017-01-05 MED ORDER — ACETAMINOPHEN 325 MG PO TABS: 650.0000 mg | ORAL_TABLET | Freq: Four times a day (QID) | ORAL | Status: DC | PRN

## 2017-01-05 MED ORDER — ONDANSETRON HCL 4 MG PO TABS: 4.0000 mg | ORAL_TABLET | Freq: Four times a day (QID) | ORAL | Status: DC | PRN

## 2017-01-05 NOTE — ED Triage Notes (Signed)
Pt reports to ED w/ c/o AMS. Pts granddaughter brought pt in, sts that she has been on Cipro and Flagyl for UTI and diverticulitis w/ approx 2 days left.  Pt confused to location, time or situation. Pt alert, cooperative.  Family sts that pt confused yesterday, gradually worsening today, sts that pt has been taking to people "long gone". Resp even and unlabored.

## 2017-01-05 NOTE — Progress Notes (Signed)
Family Meeting Note  Advance Directive:yes  Today a meeting took place with the Patient's grand daughter at bed side  Patient is unable to participate due WT:UUEKCM capacity altered   The following clinical team members were present during this meeting:MD  The following were discussed:Patient's diagnosis: Management and plan of care discussed in detail with the patient's granddaughter at bedside , Patient's progosis: Unable to determine and Goals for treatment: DNR , Dr. [name] is the healthcare power of attorney. Not considering palliative care at this time  Additional follow-up to be provided: MD  Time spent during discussion:16 MIN  Nicholes Mango, MD

## 2017-01-05 NOTE — ED Provider Notes (Signed)
[facility] Emergency Department Provider Note   ____________________________________________   First MD Initiated Contact with Patient 01/05/17 1233     (approximate)  I have reviewed the triage vital signs and the nursing notes.   HISTORY  Chief Complaint Altered Mental Status  EM caveat: Patient very confused  HPI Karen Krueger is a 83 y.o. female  here for evaluation of increasing confusion  Recently treated for diverticulitis and UTI. Worsening last 2-3 days, now hallucinating at times for family. Acting unusually and that she seems very confused. Perhaps decreased appetite somewhat, but no fevers, vomiting, diarrhea, or obvious ongoing infectious symptoms. She has not been complaining of any abdominal pain. No chest pain or trouble breathing that her family is aware of  Patient's granddaughter provides 29 of history  Past Medical History:  Diagnosis Date  . Blind left eye   . Cancer ([place])    previous skin cancer   . Dementia   . Glaucoma    blind in Left eye   . Hypertension    controlled with medication   . Stroke [facility])    two previous strokes, 2005, 2011     Patient Active Problem List   Diagnosis Date Noted  . Dementia due to Alzheimer's disease 08/03/2016  . Fall 08/02/2016  . Acute encephalopathy 08/02/2016  . HTN (hypertension) 08/02/2016  . Glaucoma 08/02/2016  . Elevated troponin 08/02/2016    Past Surgical History:  Procedure Laterality Date  . ABDOMINAL HYSTERECTOMY    . APPENDECTOMY    . AQUEOUS SHUNT Right 05/06/2016   Procedure: AQUEOUS SHUNT;  Surgeon: Ronnell Freshwater, MD;  Location: [place];  Service: Ophthalmology;  Laterality: Right;  SCLERAL PATCH GRAFT AHMED FP7  . EYE SURGERY     glucacoma numerous times     Prior to Admission medications   Medication Sig Start Date End Date Taking? Authorizing Provider  bimatoprost (LUMIGAN) 0.01 % SOLN Place 1 drop into the right eye at  bedtime.    [provider]  latanoprost (XALATAN) 0.005 % ophthalmic solution Place 1 drop into both eyes at bedtime.    [provider]  levofloxacin (LEVAQUIN) 500 MG tablet Take 1 tablet by mouth daily. 07/26/16   [provider]  naproxen (NAPROSYN) 500 MG tablet Take 1 tablet (500 mg total) by mouth 2 (two) times daily with a meal. 08/12/16   Lavonia Drafts, MD  polyethylene glycol (MIRALAX / Floria Raveling) packet Take 17 g by mouth daily. 08/03/16   Bettey Costa, MD  Timolol Maleate (ISTALOL) 0.5 % (DAILY) SOLN Place 1 drop into the right eye 2 (two) times daily.     [provider]  verapamil (CALAN-SR) 180 MG CR tablet Take 180 mg by mouth 2 (two) times daily.    [provider]    Allergies Codeine; Sulfa antibiotics; and Penicillins  Family History  Problem Relation Age of Onset  . Heart disease Father   . Stroke Daughter     Social History Social History  Substance Use Topics  . Smoking status: Never Smoker  . Smokeless tobacco: Never Used     Comment: quit over 45 yrs ago  . Alcohol use No    Review of Systems  EM caveat    ____________________________________________   PHYSICAL EXAM:  VITAL SIGNS: ED Triage Vitals  Enc Vitals Group     BP 01/05/17 1201 106/63     Pulse Rate 01/05/17 1201 63     Resp  01/05/17 1201 18     Temp 01/05/17 1201 97.9 F (36.6 C)     Temp Source 01/05/17 1201 Oral     SpO2 01/05/17 1201 95 %     Weight 01/05/17 1202 130 lb (59 kg)     Height --      Head Circumference --      Peak Flow --      Pain Score 01/05/17 1200 0     Pain Loc --      Pain Edu? --      Excl. in [place]? --    Constitutional: Alert and completely disoriented. Well appearing and in no acute distress. Eyes: Conjunctivae Fairly normal on the right, the left conjunctiva appears normal but there is significant scarring over the pupil and iris. Patient's granddaughter reports she's been blind in left eye for some time and  this is chronic ad: Atraumatic. Nose: No congestion/rhinnorhea. Mouth/Throat: Mucous membranes are moist. Neck: No stridor.   Cardiovascular: Normal rate, regular rhythm. Grossly normal heart sounds.  Good peripheral circulation. Respiratory: Normal respiratory effort.  No retractions. Lungs CTAB. Gastrointestinal: Soft and nontender. No distention. Musculoskeletal: No lower extremity tenderness nor edema. Neurologic:  Normal speech and language. No gross focal neurologic deficits are appreciated. Moves all extremities to pain without obvious deficit.  Skin:  Skin is warm, dry and intact. No rash noted. Psychiatric: Mood and affect are normal. Speech and behavior are normal.  ____________________________________________   LABS (all labs ordered are listed, but only abnormal results are displayed)  Labs Reviewed  COMPREHENSIVE METABOLIC PANEL - Abnormal; Notable for the following:       Result Value   Sodium 130 (*)    Chloride 98 (*)    Glucose, Bld 135 (*)    Total Protein 8.4 (*)    Total Bilirubin 1.3 (*)    All other components within normal limits  CBC - Abnormal; Notable for the following:    RDW 14.7 (*)    All other components within normal limits  URINALYSIS, COMPLETE (UACMP) WITH MICROSCOPIC - Abnormal; Notable for the following:    Color, Urine AMBER (*)    APPearance CLEAR (*)    Leukocytes, UA SMALL (*)    Squamous Epithelial / LPF 0-5 (*)    All other components within normal limits  URINE CULTURE  LIPASE, BLOOD  AMMONIA  TROPONIN I  CBG MONITORING, ED   ____________________________________________  EKG  Reviewed and interpreted by me at 1330 Heart rate 60 Curiously QTc 420 Normal sinus rhythm, no evidence of ischemia or ectopy ____________________________________________  RADIOLOGY  Dg Wrist Complete Right  Result Date: 01/05/2017 CLINICAL DATA:  AMS, slight swelling noted of right wrist H/io stroke, never smoker EXAM: RIGHT WRIST - COMPLETE 3+  VIEW COMPARISON:  08/03/2010 FINDINGS: Oval there are degenerative changes primarily at the first carpometacarpal joint where there is mild subluxation chronic joint space narrowing, and sclerosis. There is chondrocalcinosis at the radiocarpal joint. Intercarpal spaces are otherwise normal. There is no acute fracture or traumatic subluxation. IMPRESSION: No evidence for acute abnormality. Osteoarthritis primarily involving the first carpometacarpal joint. Electronically Signed   By: Nolon Nations M.D.   On: 01/05/2017 13:40   Ct Head Wo Contrast  Result Date: 01/05/2017 CLINICAL DATA:  Acute mental status change. EXAM: CT HEAD WITHOUT CONTRAST TECHNIQUE: Contiguous axial images were obtained from the base of the skull through the vertex without intravenous contrast. COMPARISON:  Nov 24, 2016 FINDINGS: Brain: No subdural, epidural, or subarachnoid  hemorrhage. Ventricles and sulci are stable. White matter changes are stable. No acute cortical ischemia or infarct. No mass, mass effect, or midline shift. Vascular: Calcified atherosclerosis is seen in the intracranial carotid artery's. Skull: Normal. Negative for fracture or focal lesion. Sinuses/Orbits: No acute finding. Other: High attenuation in the posterior left globe is unchanged since Nov 24, 2016. No acute extracranial soft tissue abnormalities. IMPRESSION: 1. High attenuation posteriorly in the left eye is unchanged since Nov 24, 2016 and could represent blood products from retinal hemorrhage or detachment. This does not appear to be acute today. Ophthalmology consultation is recommended. 2. Chronic white matter changes.  No acute intracranial abnormality. Electronically Signed   By: Dorise Bullion III M.D   On: 01/05/2017 13:49   Dg Chest Portable 1 View  Result Date: 01/05/2017 ONE CLINICAL DATA: Status, right forearm swelling EXAM: PORTABLE CHEST 1 VIEW COMPARISON:  11/24/2016 chest radiograph. FINDINGS: Stable cardiomediastinal silhouette with  top-normal heart size and aortic atherosclerosis. No pneumothorax. No pleural effusion. No pulmonary edema. Stable mild biapical pleural capping. No acute consolidative airspace disease. IMPRESSION: No active cardiopulmonary disease. Electronically Signed   By: Ilona Sorrel M.D.   On: 01/05/2017 13:39   CT no regarding the left eye redness is noted. This seems to be chronic, patient's family reports that she has been chronically blind in the left eye ____________________________________________   PROCEDURES  Procedure(s) performed: None  Procedures  Critical Care performed: No  ____________________________________________   INITIAL IMPRESSION / ASSESSMENT AND PLAN / ED COURSE  Pertinent labs & imaging results that were available during my care of the patient were reviewed by me and considered in my medical decision making (see chart for details).  Patient presents with somewhat acute change in her mental status, recently treated for UTI and possible enterocolitis a stump primary notes. She is completed course and has not had any further diarrhea, fevers vomiting or obvious infectious symptoms, however family reports last 2-3 days she can initially more confused than normal. She is been talking to people who are not alive, she didn't seeing scary images, she is very confused today though she does have some baseline dementia it is certainly sounds like is sudden worsening  ----------------------------------------- 2:34 PM on 01/05/2017 -----------------------------------------   Her cardiac evaluation, CT scan, lab work, urinalysis, etc. has thus far not revealed an obvious etiology for altered mental status.      patient's primary care note from June 5 this year reports she is alert and oriented 3. Her clearly been a significant change in her altered mental status and she is now very disoriented to date, place or situation. Given significant change in her mental status, despite the  testing which I have thus far performed in the ER that does not elucidate an obvious cause, however admitted to the hospital for further workup as this obviously represents an acute change in her mental status based on recent examination from only a couple weeks ago.  ____________________________________________   FINAL CLINICAL IMPRESSION(S) / ED DIAGNOSES  Final diagnoses:  Disorientation  Confusion      NEW MEDICATIONS STARTED DURING THIS VISIT:  New Prescriptions   No medications on file     Note:  This document was prepared using Dragon voice recognition software and may include unintentional dictation errors.     Delman Kitten, MD 01/05/17 [phone]

## 2017-01-05 NOTE — H&P (Signed)
[facility] [place] NAME: Karen Krueger    MR#:  [id number]  DATE OF BIRTH:  [birth year]  DATE OF ADMISSION:  01/05/2017  PRIMARY CARE PHYSICIAN: Idelle Crouch, MD   REQUESTING/REFERRING PHYSICIAN: Delman Kitten, MD  CHIEF COMPLAINT:  Altered Mental Status  HISTORY OF PRESENT ILLNESS:  [place] Karen Krueger  is a 83 y.o. 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 Patient is confused from her baseline. Has some baseline dementia but today she is hallucinating and altered. Granddaughter at bedside concerned about her hallucinations and confusion. CT head is negative, UA with small leuk esterase. Sodium at 130 and daughter is reporting that patient is not taking enough fluids and decreased by mouth intake  PAST MEDICAL HISTORY:   Past Medical History:  Diagnosis Date  . Blind left eye   . Cancer ([place])    previous skin cancer   . Dementia   . Glaucoma    blind in Left eye   . Hypertension    controlled with medication   . Stroke [facility])    two previous strokes, 2005, 2011     PAST SURGICAL HISTOIRY:   Past Surgical History:  Procedure Laterality Date  . ABDOMINAL HYSTERECTOMY    . APPENDECTOMY    . AQUEOUS SHUNT Right 05/06/2016   Procedure: AQUEOUS SHUNT;  Surgeon: Ronnell [place], MD;  Location: [facility];  Service: Ophthalmology;  Laterality: Right;  SCLERAL PATCH GRAFT AHMED FP7  . EYE SURGERY     glucacoma numerous times     SOCIAL HISTORY:   Social History  Substance Use Topics  . Smoking status: Never Smoker  . Smokeless tobacco: Never Used     Comment: quit over 45 yrs ago  .  Alcohol use No    FAMILY HISTORY:   Family History  Problem Relation Age of Onset  . Heart disease Father   . Stroke Daughter     DRUG ALLERGIES:   Allergies  Allergen Reactions  . Codeine Nausea And Vomiting  . Sulfa Antibiotics Other (See Comments)    Unknown   . Penicillins Nausea Only    REVIEW OF SYSTEMS:  Review of systems unobtainable as the patient is altered  MEDICATIONS AT HOME:   Prior to Admission medications   Medication Sig Start Date End Date Taking? Authorizing Provider  latanoprost (XALATAN) 0.005 % ophthalmic solution Place 1 drop into both eyes at bedtime.   Yes [provider]  metroNIDAZOLE (FLAGYL) 250 MG tablet Take 250 mg by mouth 3 (three) times daily. 12/23/16  Yes [provider]  naproxen (NAPROSYN) 500 MG tablet Take 1 tablet (500 mg total) by mouth 2 (two) times daily with a meal. 08/12/16  Yes Lavonia Drafts, MD  Timolol Maleate (ISTALOL) 0.5 % (DAILY) SOLN Place 1 drop into the right eye 2 (two) times daily.    Yes [provider]  verapamil (CALAN-SR) 180 MG CR tablet Take 180 mg by mouth 2 (two) times daily.   Yes [provider]  polyethylene glycol (MIRALAX / GLYCOLAX) packet Take 17 g by mouth daily. Patient not taking: Reported on 01/05/2017 08/03/16   Karen Costa, MD  VITAL SIGNS:  Blood pressure (!) 143/73, pulse (!) 59, temperature 97.9 F (36.6 C), temperature source Oral, resp. rate 15, weight 59 kg (130 lb), SpO2 97 %.  PHYSICAL EXAMINATION:  GENERAL:  83 y.o.-year-old patient lying in the bed with no acute distress.  EYES: Pupils equal, round, reactive to light and accommodation. Erythematous conjunctiva HEENT: Head atraumatic, normocephalic. Oropharynx and nasopharynx clear.  NECK:  Supple, no jugular venous distention. No thyroid enlargement, no tenderness.  LUNGS: Normal breath sounds bilaterally, no wheezing, rales,rhonchi or crepitation. No use of accessory muscles of respiration.   CARDIOVASCULAR: S1, S2 normal. No murmurs, rubs, or gallops.  ABDOMEN: Soft, nontender, nondistended. Bowel sounds present. No organomegaly or mass.  EXTREMITIES: No pedal edema, cyanosis, or clubbing.  NEUROLOGIC: Altered with hallucination  PSYCHIATRIC: The patient is altered and disoriented SKIN: No obvious rash, lesion, or ulcer.   LABORATORY PANEL:   CBC  Recent Labs Lab 01/05/17 1202  WBC 6.5  HGB 12.5  HCT 36.8  PLT 265   ------------------------------------------------------------------------------------------------------------------  Chemistries   Recent Labs Lab 01/05/17 1202  NA 130*  K 3.6  CL 98*  CO2 25  GLUCOSE 135*  BUN 13  CREATININE 0.57  CALCIUM 9.4  AST 30  ALT 16  ALKPHOS 51  BILITOT 1.3*   ------------------------------------------------------------------------------------------------------------------  Cardiac Enzymes  Recent Labs Lab 01/05/17 1202  TROPONINI <0.03   ------------------------------------------------------------------------------------------------------------------  RADIOLOGY:  Dg Wrist Complete Right  Result Date: 01/05/2017 CLINICAL DATA:  AMS, slight swelling noted of right wrist H/io stroke, never smoker EXAM: RIGHT WRIST - COMPLETE 3+ VIEW COMPARISON:  08/03/2010 FINDINGS: Oval there are degenerative changes primarily at the first carpometacarpal joint where there is mild subluxation chronic joint space narrowing, and sclerosis. There is chondrocalcinosis at the radiocarpal joint. Intercarpal spaces are otherwise normal. There is no acute fracture or traumatic subluxation. IMPRESSION: No evidence for acute abnormality. Osteoarthritis primarily involving the first carpometacarpal joint. Electronically Signed   By: Nolon Nations M.D.   On: 01/05/2017 13:40   Ct Head Wo Contrast  Result Date: 01/05/2017 CLINICAL DATA:  Acute mental status change. EXAM: CT HEAD WITHOUT CONTRAST TECHNIQUE: Contiguous axial images were  obtained from the base of the skull through the vertex without intravenous contrast. COMPARISON:  Nov 24, 2016 FINDINGS: Brain: No subdural, epidural, or subarachnoid hemorrhage. Ventricles and sulci are stable. White matter changes are stable. No acute cortical ischemia or infarct. No mass, mass effect, or midline shift. Vascular: Calcified atherosclerosis is seen in the intracranial carotid artery's. Skull: Normal. Negative for fracture or focal lesion. Sinuses/Orbits: No acute finding. Other: High attenuation in the posterior left globe is unchanged since Nov 24, 2016. No acute extracranial soft tissue abnormalities. IMPRESSION: 1. High attenuation posteriorly in the left eye is unchanged since Nov 24, 2016 and could represent blood products from retinal hemorrhage or detachment. This does not appear to be acute today. Ophthalmology consultation is recommended. 2. Chronic white matter changes.  No acute intracranial abnormality. Electronically Signed   By: Dorise Bullion III M.D   On: 01/05/2017 13:49   Dg Chest Portable 1 View  Result Date: 01/05/2017 ONE CLINICAL DATA: Status, right forearm swelling EXAM: PORTABLE CHEST 1 VIEW COMPARISON:  11/24/2016 chest radiograph. FINDINGS: Stable cardiomediastinal silhouette with top-normal heart size and aortic atherosclerosis. No pneumothorax. No pleural effusion. No pulmonary edema. Stable mild biapical pleural capping. No acute consolidative airspace disease. IMPRESSION: No active cardiopulmonary disease. Electronically Signed   By: Ilona Sorrel M.D.   On: 01/05/2017  13:39    EKG:   Orders placed or performed during the hospital encounter of 01/05/17  . ED EKG  . ED EKG  . EKG 12-Lead  . EKG 12-Lead    IMPRESSION AND PLAN:   Karen Krueger  is a 83 y.o. 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 Patient is confused from her baseline. Has some baseline dementia but today she is hallucinating and altered.  # Altered mental status with hallucinations Admit to MedSurg unit Etiology is most likely dehydration Gentle hydration with IV fluids Supportive treatment Consider psychiatry consult if no improvement Currently nothing by mouth, neuro checks  #Hyponatremia from dehydration Hydrate with IV fluids, follow serial BMPs Consider nephrology consult if no improvement  #History of glaucoma and chronic retinal hemorrhage Continue home medications timolol and Xalatan Outpatient follow-up with [facility], patient's primary ophthalmologist Patient is legally blind  #Recent history of enterocolitis and cystitis Patient has completed ciprofloxacin and Flagyl course as per pharmacy  Will get urine culture and sensitivity and not adding any new antibiotics at this time  #History of dementia Currently patient is altered from her baseline, will hold by mouth medications Get neuro checks    All the records are reviewed and case discussed with ED provider. Management plans discussed with the patient, family and they are in agreement.  CODE STATUS: DO NOT RESUSCITATE, daughter [name] is the healthcare power of attorney  TOTAL TIME TAKING CARE OF THIS PATIENT: 43 minutes.   Note: This dictation was prepared with Dragon dictation along with smaller phrase technology. Any transcriptional errors that result from this process are unintentional.  Nicholes Mango M.D on 01/05/2017 at 4:18 PM  Between 7am to 6pm - Pager - [phone]  After 6pm go to [email or web address] - password [email or web address] [facility]  Office  [phone]  CC: Primary care physician; Idelle Crouch, MD

## 2017-01-05 NOTE — ED Notes (Signed)
Attempted collection of urine but patient did not have to go at this time.

## 2017-01-06 LAB — CBC
HCT: 34.4 % — ABNORMAL LOW (ref 35.0–47.0)
Hemoglobin: 11.9 g/dL — ABNORMAL LOW (ref 12.0–16.0)
MCH: 31.4 pg (ref 26.0–34.0)
MCHC: 34.5 g/dL (ref 32.0–36.0)
MCV: 90.9 fL (ref 80.0–100.0)
PLATELETS: 229 10*3/uL (ref 150–440)
RBC: 3.79 MIL/uL — ABNORMAL LOW (ref 3.80–5.20)
RDW: 14.4 % (ref 11.5–14.5)
WBC: 3.6 10*3/uL (ref 3.6–11.0)

## 2017-01-06 LAB — COMPREHENSIVE METABOLIC PANEL
ALT: 13 U/L — ABNORMAL LOW (ref 14–54)
ANION GAP: 2 — AB (ref 5–15)
AST: 22 U/L (ref 15–41)
Albumin: 3.4 g/dL — ABNORMAL LOW (ref 3.5–5.0)
Alkaline Phosphatase: 47 U/L (ref 38–126)
BUN: 6 mg/dL (ref 6–20)
CO2: 28 mmol/L (ref 22–32)
Calcium: 8.7 mg/dL — ABNORMAL LOW (ref 8.9–10.3)
Chloride: 106 mmol/L (ref 101–111)
Creatinine, Ser: 0.59 mg/dL (ref 0.44–1.00)
Glucose, Bld: 91 mg/dL (ref 65–99)
POTASSIUM: 3.9 mmol/L (ref 3.5–5.1)
Sodium: 136 mmol/L (ref 135–145)
Total Bilirubin: 1.2 mg/dL (ref 0.3–1.2)
Total Protein: 7 g/dL (ref 6.5–8.1)

## 2017-01-06 MED ORDER — LORAZEPAM 2 MG/ML IJ SOLN
1.0000 mg | INTRAMUSCULAR | Status: DC
Start: 2017-01-06 — End: 2017-01-06

## 2017-01-06 MED ORDER — ALPRAZOLAM 0.25 MG PO TABS: 0.2500 mg | ORAL_TABLET | Freq: Two times a day (BID) | ORAL | Status: DC | PRN

## 2017-01-06 NOTE — Care Management (Signed)
Patient admitted from home with Altered mental status.  Patient lives at home with son.  2 other adult children alternate care.  Patient has hospital bed, shower chair, and RW.  Patient to discharge home today, per family patient is at baseline.  No indication for home health at discharge.

## 2017-01-06 NOTE — Discharge Summary (Signed)
[facility] [place] NAME: Karen Krueger    MR#:  [id number]  DATE OF BIRTH:  [birth year]  DATE OF ADMISSION:  01/05/2017 ADMITTING PHYSICIAN: Nicholes Mango, MD  DATE OF DISCHARGE: 01/06/2017   PRIMARY CARE PHYSICIAN: Idelle Crouch, MD    ADMISSION DIAGNOSIS:  Confusion [R41.0] Disorientation [R41.0]  DISCHARGE DIAGNOSIS:  Active Problems:   Altered mental status   Delirium and dementia,  SECONDARY DIAGNOSIS:   Past Medical History:  Diagnosis Date  . Blind left eye   . Cancer ([place])    previous skin cancer   . Dementia   . Glaucoma    blind in Left eye   . Hypertension    controlled with medication   . Stroke [facility])    two previous strokes, 2005, 2011     HOSPITAL COURSE:   Pt was brought with confusion. She had chronic vascular dementia- worsening progressively. Her 2 son and one daughter takes care of her at home. As per them, after having one night good sleep in hospital- she was back to baseline. Tests for infection and CT head are negative for any findings. Pt had some hyponatremia on admission, corrected by IV fluids and she is back to baseline. Her daughter and son requested to take her back home. She have appointment with neurologist in 1-2 weeks.  DISCHARGE CONDITIONS:   Stable.  CONSULTS OBTAINED:  Treatment Team:  Clapacs, Madie Reno, MD  DRUG ALLERGIES:   Allergies  Allergen Reactions  . Codeine Nausea And Vomiting  . Sulfa Antibiotics Other (See Comments)    Unknown   . Penicillins Nausea Only    DISCHARGE MEDICATIONS:   Current Discharge Medication List    CONTINUE these medications which have NOT CHANGED   Details  latanoprost (XALATAN) 0.005 % ophthalmic solution Place 1 drop into both eyes at bedtime.    metroNIDAZOLE (FLAGYL) 250 MG tablet Take 250 mg by mouth 3 (three) times daily.    naproxen (NAPROSYN) 500 MG tablet Take 1 tablet (500 mg total) by mouth 2 (two) times daily  with a meal. Qty: 20 tablet, Refills: 2    Timolol Maleate (ISTALOL) 0.5 % (DAILY) SOLN Place 1 drop into the right eye 2 (two) times daily.     verapamil (CALAN-SR) 180 MG CR tablet Take 180 mg by mouth 2 (two) times daily.    polyethylene glycol (MIRALAX / GLYCOLAX) packet Take 17 g by mouth daily. Qty: 14 each, Refills: 0         DISCHARGE INSTRUCTIONS:    Follow with neurologist in 1-2 week.  If you experience worsening of your admission symptoms, develop shortness of breath, life threatening emergency, suicidal or homicidal thoughts you must seek medical attention immediately by calling 911 or calling your MD immediately  if symptoms less severe.  You Must read complete instructions/literature along with all the possible adverse reactions/side effects for all the Medicines you take and that have been prescribed to you. Take any new Medicines after you have completely understood and accept all the possible adverse reactions/side effects.   Please note  You were cared for by a hospitalist during your hospital stay. If you have any questions about your discharge medications or the care you received while you were in the hospital after you are discharged, you can call the unit and asked to speak with the hospitalist on call if the hospitalist that took care of you is not available. Once you  are discharged, your primary care physician will handle any further medical issues. Please note that NO REFILLS for any discharge medications will be authorized once you are discharged, as it is imperative that you return to your primary care physician (or establish a relationship with a primary care physician if you do not have one) for your aftercare needs so that they can reassess your need for medications and monitor your lab values.    Today   CHIEF COMPLAINT:   Chief Complaint  Patient presents with  . Altered Mental Status    HISTORY OF PRESENT ILLNESS:  [place] Delta Pichon  is a 83  y.o. 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 Patient is confused from her baseline. Has some baseline dementia but today she is hallucinating and altered. Granddaughter at bedside concerned about her hallucinations and confusion. CT head is negative, UA with small leuk esterase. Sodium at 130 and daughter is reporting that patient is not taking enough fluids and decreased by mouth intake   VITAL SIGNS:  Blood pressure 118/61, pulse 69, temperature 98.1 F (36.7 C), temperature source Oral, resp. rate 16, height 5\' 2"  (1.575 m), weight 49.9 kg (110 lb), SpO2 95 %.  I/O:   Intake/Output Summary (Last 24 hours) at 01/06/17 1607 Last data filed at 01/06/17 1352  Gross per 24 hour  Intake          1961.67 ml  Output             1400 ml  Net           561.67 ml    PHYSICAL EXAMINATION:  GENERAL:  83 y.o.-year-old patient lying in the bed with no acute distress.  EYES: Pupils equal, round, reactive to light and accommodation. Erythematous conjunctiva  HEENT: Head atraumatic, normocephalic. Oropharynx and nasopharynx clear.  NECK:  Supple, no jugular venous distention. No thyroid enlargement, no tenderness.  LUNGS: Normal breath sounds bilaterally, no wheezing, rales,rhonchi or crepitation. No use of accessory muscles of respiration.  CARDIOVASCULAR: S1, S2 normal. No murmurs, rubs, or gallops.  ABDOMEN: Soft, nontender, nondistended. Bowel sounds present. No organomegaly or mass.  EXTREMITIES: No pedal edema, cyanosis, or clubbing.  NEUROLOGIC: Altered and confused , but alert. Moves all 4 limbs but does not follow commands. PSYCHIATRIC: The patient is altered and  disoriented SKIN: No obvious rash, lesion, or ulcer.    DATA REVIEW:   CBC  Recent Labs Lab 01/06/17 0349  WBC 3.6  HGB 11.9*  HCT 34.4*  PLT 229    Chemistries   Recent Labs Lab 01/06/17 0349  NA 136  K 3.9  CL 106  CO2 28  GLUCOSE 91  BUN 6  CREATININE 0.59  CALCIUM 8.7*  AST 22  ALT 13*  ALKPHOS 73  BILITOT 1.2    Cardiac Enzymes  Recent Labs Lab 01/05/17 1202  [place] <0.03    Microbiology Results  Results for orders placed or performed during the hospital encounter of 08/02/16  Urine culture     Status: None   Collection Time: 08/02/16  3:58 PM  Result Value Ref Range Status   Specimen Description URINE, CLEAN CATCH  Final   Special Requests NONE  Final  Culture NO GROWTH Performed at [facility]   Final   Report Status 08/04/2016 FINAL  Final    RADIOLOGY:  Dg Wrist Complete Right  Result Date: 01/05/2017 CLINICAL DATA:  AMS, slight swelling noted of right wrist H/io stroke, never smoker EXAM: RIGHT WRIST - COMPLETE 3+ VIEW COMPARISON:  08/03/2010 FINDINGS: Oval there are degenerative changes primarily at the first carpometacarpal joint where there is mild subluxation chronic joint space narrowing, and sclerosis. There is chondrocalcinosis at the radiocarpal joint. Intercarpal spaces are otherwise normal. There is no acute fracture or traumatic subluxation. IMPRESSION: No evidence for acute abnormality. Osteoarthritis primarily involving the first carpometacarpal joint. Electronically Signed   By: Nolon Nations M.D.   On: 01/05/2017 13:40   Ct Head Wo Contrast  Result Date: 01/05/2017 CLINICAL DATA:  Acute mental status change. EXAM: CT HEAD WITHOUT CONTRAST TECHNIQUE: Contiguous axial images were obtained from the base of the skull through the vertex without intravenous contrast. COMPARISON:  Nov 24, 2016 FINDINGS: Brain: No subdural, epidural, or subarachnoid hemorrhage. Ventricles and sulci are stable. White matter changes are  stable. No acute cortical ischemia or infarct. No mass, mass effect, or midline shift. Vascular: Calcified atherosclerosis is seen in the intracranial carotid artery's. Skull: Normal. Negative for fracture or focal lesion. Sinuses/Orbits: No acute finding. Other: High attenuation in the posterior left globe is unchanged since Nov 24, 2016. No acute extracranial soft tissue abnormalities. IMPRESSION: 1. High attenuation posteriorly in the left eye is unchanged since Nov 24, 2016 and could represent blood products from retinal hemorrhage or detachment. This does not appear to be acute today. Ophthalmology consultation is recommended. 2. Chronic white matter changes.  No acute intracranial abnormality. Electronically Signed   By: Dorise Bullion III M.D   On: 01/05/2017 13:49   Dg Chest Portable 1 View  Result Date: 01/05/2017 ONE CLINICAL DATA: Status, right forearm swelling EXAM: PORTABLE CHEST 1 VIEW COMPARISON:  11/24/2016 chest radiograph. FINDINGS: Stable cardiomediastinal silhouette with top-normal heart size and aortic atherosclerosis. No pneumothorax. No pleural effusion. No pulmonary edema. Stable mild biapical pleural capping. No acute consolidative airspace disease. IMPRESSION: No active cardiopulmonary disease. Electronically Signed   By: Ilona Sorrel M.D.   On: 01/05/2017 13:39    EKG:   Orders placed or performed during the hospital encounter of 01/05/17  . ED EKG  . ED EKG  . EKG 12-Lead  . EKG 12-Lead      Management plans discussed with the patient, family and they are in agreement.  CODE STATUS:     Code Status Orders        Start     Ordered   01/05/17 1710  Do not attempt resuscitation (DNR)  Continuous    Question Answer Comment  In the event of cardiac or respiratory ARREST Do not call a "code blue"   In the event of cardiac or respiratory ARREST Do not perform Intubation, CPR, defibrillation or ACLS   In the event of cardiac or respiratory ARREST Use medication by  any route, position, wound care, and other measures to relive pain and suffering. May use oxygen, suction and manual treatment of airway obstruction as needed for comfort.   Comments RN may pronounce      01/05/17 1709    Code Status History    Date Active Date Inactive Code Status Order ID Comments User Context   08/02/2016  9:57 PM 08/03/2016  8:56 PM Full Code [id number]  Lance Coon, MD ED  Advance Directive Documentation     Most Recent Value  Type of Advance Directive  Out of facility DNR (pink MOST or yellow form)  Pre-existing out of facility DNR order (yellow form or pink MOST form)  -  "MOST" Form in Place?  -      TOTAL TIME TAKING CARE OF THIS PATIENT: 35 minutes.    Vaughan Basta M.D on 01/06/2017 at 4:07 PM  Between 7am to 6pm - Pager - [phone]  After 6pm go to [email or web address] - password [email or web address] [facility]  Office  [phone]  CC: Primary care physician; Idelle Crouch, MD   Note: This dictation was prepared with Dragon dictation along with smaller phrase technology. Any transcriptional errors that result from this process are unintentional.

## 2017-01-06 NOTE — Care Management Obs Status (Signed)
[facility] NOTIFICATION   Patient Details  Name: Karen Krueger MRN: [id number] Date of Birth: August 29, 1931   Medicare Observation Status Notification Given:       Beau Fanny, RN 01/06/2017, 4:55 PM

## 2017-01-06 NOTE — Care Management CC44 (Signed)
Condition Code 44 Documentation Completed  Patient Details  Name: Leane Loring MRN: [id number] Date of Birth: 1931-11-26   Condition Code 44 given:  Yes Patient signature on Condition Code 44 notice:  Yes Documentation of 2 MD's agreement:    Code 44 added to claim:       Beverly Sessions, RN 01/06/2017, 4:24 PM

## 2017-01-06 NOTE — Progress Notes (Signed)
Patient is alert and oriented to self only.She did have episode of agitation and wanting to go home this afternoon. MD was aware about the issue. But later in the afternoon, discharge order received. No signs of acute distress. Discharge instructions given. Patient's family  verbalized understanding. No other issues noted at this time.

## 2017-01-06 NOTE — Evaluation (Signed)
Clinical/Bedside Swallow Evaluation Patient Details  Name: Karen Krueger MRN: [id number] Date of Birth: 05-27-1932  Today's Date: 01/06/2017 Time: SLP Start Time (ACUTE ONLY): 1350 SLP Stop Time (ACUTE ONLY): 1450 SLP Time Calculation (min) (ACUTE ONLY): 60 min  Past Medical History:  Past Medical History:  Diagnosis Date  . Blind left eye   . Cancer ([place])    previous skin cancer   . Dementia   . Glaucoma    blind in Left eye   . Hypertension    controlled with medication   . Stroke [facility])    two previous strokes, 2005, 2011    Past Surgical History:  Past Surgical History:  Procedure Laterality Date  . ABDOMINAL HYSTERECTOMY    . APPENDECTOMY    . AQUEOUS SHUNT Right 05/06/2016   Procedure: AQUEOUS SHUNT;  Surgeon: Ronnell Freshwater, MD;  Location: [place];  Service: Ophthalmology;  Laterality: Right;  SCLERAL PATCH GRAFT AHMED FP7  . EYE SURGERY     glucacoma numerous times    HPI:  Pt is a 83 y.o. 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 Patient is confused from her baseline. Has baseline dementia but today she is hallucinating and altered. Granddaughter at bedside concerned about her hallucinations and confusion. CT head is negative, UA with small leuk esterase. Sodium at 130 and daughter is reporting that patient is not taking enough fluids and decreased by mouth intake. Family reported pt has had recent UTI. Also reported she "can eat and drink very well".    Assessment / Plan / Recommendation Clinical Impression  Pt appears to present w/ minimal oropharyngeal phase dysphagia only hampered  by her declined Cognitive status (baseline moderate Demential) impacting her participation in tasks including eating/drinking. Pt needed encouragement to participate in taking bites and sips but then held the cup to drink independently. Pt exhibited no overt s/s of aspiration w/ trials given; oral phase grossly wfl for bolus management and clearing. Suspect pt's Cognitive decline could impact her overall awareness for task of oral intake; min modifications in food consistency such as cutting tougher meats for less mastication effort and reducing environmental noise and stimulation could be beneficial. Pt may also benefit from a consult from a Dietician for any supplements in her diet (ie, drink supplements). Recommend general aspiration precautions and monitoring at meals. No further skilled ST Services indicated at this time as pt appears at her baseline status w/ swallowing per family description. NSG updated. Of note, would recommend Pills in Puree - possibly Crushed for easier intake.  SLP Visit Diagnosis: Dysphagia, unspecified (R13.10)    Aspiration Risk   (reduced following general precautions)    Diet Recommendation  Dysphagia level 3 (mech soft) w/ thin liquids; general aspiration precautions; monitoring at meals for encouragement w/ po's as needed, tray setup  Medication Administration: Whole meds with puree (Crushed if indicated for easier swallowing)    Other  Recommendations Recommended Consults:  (Dietician consult) Oral Care Recommendations: Oral care BID;Staff/trained caregiver to provide oral care   Follow up Recommendations None      Frequency and Duration            Prognosis  Prognosis for Safe Diet Advancement: Good Barriers to Reach Goals: Cognitive deficits;Behavior;Severity of deficits Barriers/Prognosis Comment: needed encouragement to participate in task of taking po's      Swallow Study   General Date of Onset: 01/05/17 HPI: Pt is a 83 y.o. 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 Patient is confused from her baseline. Has baseline dementia but today she is hallucinating and altered. Granddaughter at bedside concerned about her hallucinations and confusion. CT head is negative, UA with small leuk esterase. Sodium at 130 and daughter is reporting that patient is not taking enough fluids and decreased by mouth intake. Family reported pt has had recent UTI. Also reported she "can eat and drink very well".  Type of Study: Bedside Swallow Evaluation Previous Swallow Assessment: none reported Diet Prior to this Study: Regular;Thin liquids (at home per family) Temperature Spikes Noted: No (wbc 3.6 trending down from previous) Respiratory Status: Room air History of Recent Intubation: No Behavior/Cognition: Alert;Confused;Agitated;Distractible;Requires cueing (required encouragement) Oral Cavity Assessment: Within Functional Limits Oral Care Completed by SLP: Recent completion by staff Oral Cavity - Dentition: Adequate natural dentition Vision: Functional for self-feeding Self-Feeding Abilities: Able to feed self;Needs assist;Needs set up Patient Positioning: Upright in bed Baseline Vocal Quality: Normal Volitional Cough: Cognitively unable to elicit Volitional Swallow: Able to elicit    Oral/Motor/Sensory Function Overall Oral Motor/Sensory Function: Within functional limits (and w/ bolus management/clearing)   Ice Chips Ice chips: Within functional limits Presentation: Spoon (fed; 1 trial) Other Comments: pt stated she "did not want to take any pills" when offered the ice chips   Thin Liquid  Thin Liquid: Within functional limits Presentation: Cup;Self Fed;Straw (several ounces total) Other Comments: needed encouragement to participate in task    Nectar Thick Nectar Thick Liquid: Not tested   Honey Thick Honey Thick Liquid: Not tested   Puree Puree: Within functional limits Presentation: Spoon (fed; 4 trials) Other Comments: needed encouragement to participate in task   Solid   GO   Solid: Impaired (softened trials) Presentation: Self Fed (fed; 3 trials) Oral Phase Impairments:  (min reduced attention w/ task) Pharyngeal Phase Impairments:  (none) Other Comments: needed encouragement to participate in task        Orinda Kenner, MS, CCC-SLP Watson,Katherine 01/06/2017,4:07 PM

## 2017-01-07 LAB — URINE CULTURE
Culture: NO GROWTH
Special Requests: NORMAL

## 2017-01-23 NOTE — Progress Notes (Signed)
Late entry for missed G-code. Based on review of the evaluation and goals by this therapist, Katherine Watson, MS, CCC-SLP   Katherine Watson, MS, CCC-SLP   

## 2017-07-04 ENCOUNTER — Emergency Department
Admission: EM | Admit: 2017-07-04 | Discharge: 2017-07-05 | Disposition: A | Discharge status: Home / Self Care | Payer: Medicare Other | Attending: Emergency Medicine | Admitting: Emergency Medicine

## 2017-07-04 ENCOUNTER — Other Ambulatory Visit: Payer: Self-pay

## 2017-07-04 ENCOUNTER — Emergency Department: Payer: Medicare Other

## 2017-07-04 ENCOUNTER — Encounter: Payer: Self-pay | Admitting: Emergency Medicine

## 2017-07-04 DIAGNOSIS — Z8673 Personal history of transient ischemic attack (TIA), and cerebral infarction without residual deficits: Secondary | ICD-10-CM | POA: Diagnosis not present

## 2017-07-04 DIAGNOSIS — I10 Essential (primary) hypertension: Secondary | ICD-10-CM | POA: Diagnosis not present

## 2017-07-04 DIAGNOSIS — I951 Orthostatic hypotension: Secondary | ICD-10-CM

## 2017-07-04 DIAGNOSIS — E860 Dehydration: Secondary | ICD-10-CM | POA: Diagnosis not present

## 2017-07-04 DIAGNOSIS — G309 Alzheimer's disease, unspecified: Secondary | ICD-10-CM | POA: Insufficient documentation

## 2017-07-04 DIAGNOSIS — F0280 Dementia in other diseases classified elsewhere without behavioral disturbance: Secondary | ICD-10-CM | POA: Diagnosis not present

## 2017-07-04 DIAGNOSIS — R55 Syncope and collapse: Secondary | ICD-10-CM | POA: Diagnosis not present

## 2017-07-04 DIAGNOSIS — R1030 Lower abdominal pain, unspecified: Secondary | ICD-10-CM | POA: Diagnosis not present

## 2017-07-04 DIAGNOSIS — E876 Hypokalemia: Secondary | ICD-10-CM | POA: Diagnosis not present

## 2017-07-04 LAB — BASIC METABOLIC PANEL
Anion gap: 8 (ref 5–15)
BUN: 28 mg/dL — ABNORMAL HIGH (ref 6–20)
CHLORIDE: 103 mmol/L (ref 101–111)
CO2: 28 mmol/L (ref 22–32)
Calcium: 9.3 mg/dL (ref 8.9–10.3)
Creatinine, Ser: 0.64 mg/dL (ref 0.44–1.00)
GFR calc Af Amer: >60 mL/min (ref >60)
GFR calc non Af Amer: >60 mL/min (ref >60)
GLUCOSE: 123 mg/dL — AB (ref 65–99)
POTASSIUM: 3.3 mmol/L — AB (ref 3.5–5.1)
Sodium: 139 mmol/L (ref 135–145)

## 2017-07-04 LAB — URINALYSIS, COMPLETE (UACMP) WITH MICROSCOPIC
BACTERIA UA: NONE SEEN
Bilirubin Urine: NEGATIVE
Glucose, UA: NEGATIVE mg/dL
Hgb urine dipstick: NEGATIVE
KETONES UR: NEGATIVE mg/dL
LEUKOCYTES UA: NEGATIVE
Nitrite: NEGATIVE
PH: 6 (ref 5.0–8.0)
Protein, ur: NEGATIVE mg/dL
Specific Gravity, Urine: 1.006 (ref 1.005–1.030)

## 2017-07-04 LAB — GASTROINTESTINAL PANEL BY PCR, STOOL (REPLACES STOOL CULTURE)

## 2017-07-04 LAB — CBC
HCT: 38.9 % (ref 35.0–47.0)
Hemoglobin: 12.9 g/dL (ref 12.0–16.0)
MCH: 30.8 pg (ref 26.0–34.0)
MCHC: 33.1 g/dL (ref 32.0–36.0)
MCV: 93 fL (ref 80.0–100.0)
Platelets: 238 10*3/uL (ref 150–440)
RBC: 4.18 MIL/uL (ref 3.80–5.20)
RDW: 15.3 % — AB (ref 11.5–14.5)
WBC: 4.2 10*3/uL (ref 3.6–11.0)

## 2017-07-04 LAB — C DIFFICILE QUICK SCREEN W PCR REFLEX
C DIFFICLE (CDIFF) ANTIGEN: NEGATIVE
C Diff interpretation: NOT DETECTED
C Diff toxin: NEGATIVE

## 2017-07-04 MED ORDER — IOPAMIDOL (ISOVUE-300) INJECTION 61%
15.0000 mL | INTRAVENOUS | Status: AC
  Administered 2017-07-04: 15 mL via ORAL

## 2017-07-04 MED ORDER — SODIUM CHLORIDE 0.9 % IV BOLUS (SEPSIS): 1000.0000 mL | Freq: Once | INTRAVENOUS | Status: DC

## 2017-07-04 MED ORDER — IOPAMIDOL (ISOVUE-300) INJECTION 61%: 30.0000 mL | Freq: Once | INTRAVENOUS | Status: DC | PRN

## 2017-07-04 MED ORDER — FENTANYL CITRATE (PF) 100 MCG/2ML IJ SOLN
25.0000 ug | Freq: Once | INTRAMUSCULAR | Status: AC
  Administered 2017-07-04: 25 ug via INTRAVENOUS
  Filled 2017-07-04: qty 2

## 2017-07-04 MED ORDER — SODIUM CHLORIDE 0.9 % IV BOLUS (SEPSIS)
1000.0000 mL | Freq: Once | INTRAVENOUS | Status: AC
  Administered 2017-07-04: 1000 mL via INTRAVENOUS

## 2017-07-04 MED ORDER — IOPAMIDOL (ISOVUE-300) INJECTION 61%
75.0000 mL | Freq: Once | INTRAVENOUS | Status: AC | PRN
  Administered 2017-07-04: 75 mL via INTRAVENOUS

## 2017-07-04 MED ORDER — POTASSIUM CHLORIDE CRYS ER 20 MEQ PO TBCR
40.0000 meq | EXTENDED_RELEASE_TABLET | Freq: Once | ORAL | Status: AC
  Administered 2017-07-04: 40 meq via ORAL
  Filled 2017-07-04: qty 2

## 2017-07-04 MED ORDER — ONDANSETRON HCL 4 MG/2ML IJ SOLN
4.0000 mg | Freq: Once | INTRAMUSCULAR | Status: AC
  Administered 2017-07-04: 4 mg via INTRAVENOUS
  Filled 2017-07-04: qty 2

## 2017-07-04 NOTE — Discharge Instructions (Signed)
Please drink plenty of fluids to stay well-hydrated and to prevent fainting again.  Please move slowly from a lying down to seated to standing position to prevent lightheadedness or fainting.  Eat small regular meals throughout the day.  Return to the emergency department if you develop lightheadedness or fainting, chest pain or palpitations, shortness of breath, abdominal pain, fever, or any other symptoms concerning to you.

## 2017-07-04 NOTE — ED Notes (Signed)
Pt placed on bedpan

## 2017-07-04 NOTE — ED Notes (Signed)
Patient transported to CT 

## 2017-07-04 NOTE — ED Notes (Signed)
Pt assisted onto bedpan for BM

## 2017-07-04 NOTE — ED Notes (Signed)
Patient placed on bedpan.

## 2017-07-04 NOTE — ED Provider Notes (Signed)
[facility] Emergency Department Provider Note  ____________________________________________  Time seen: Approximately 7:01 PM  I have reviewed the triage vital signs and the nursing notes.   HISTORY  Chief Complaint Loss of Consciousness and Abdominal Pain    HPI Karen Krueger is a 83 y.o. female with a history of dementia, hypertension, and CVA, recent orthostatic syncope, s/p remote appy, presenting for syncopal event.  The patient was with her daughter at home, and had a normal large bowel movement.  She then had the urge to go again and while walking to the bathroom she had a brief syncopal episode.  She did not have any prolonged loss of consciousness, nor any injury from her syncopal event.  Since then, the patient has had 2 large very loose stools without blood; no associated nausea or vomiting; mild cramping in the lower abdomen.  No headache, new visual changes, speech changes or changes in mental status.  On arrival, EMS found her blood pressure to be in the 80s over 40s and she was given 500 cc of fluid with normalization of her blood pressure.  The patient denies any chest pain, shortness of breath, palpitations, recent illness including cough or cold symptoms, or recent changes in medications.  She did have a fall last week, where she struck her face, but had no loss of consciousness and did not seek medical attention; her only resulting injury was bruising on the upper lip.    Past Medical History:  Diagnosis Date  . Blind left eye   . Cancer ([place])    previous skin cancer   . Dementia   . Glaucoma    blind in Left eye   . Hypertension    controlled with medication   . Stroke [facility])    two previous strokes, 2005, 2011     Patient Active Problem List   Diagnosis Date Noted  . Altered mental status 01/05/2017  . Dementia due to Alzheimer's disease 08/03/2016  . Fall 08/02/2016  . Acute encephalopathy 08/02/2016  . HTN (hypertension)  08/02/2016  . Glaucoma 08/02/2016  . Elevated troponin 08/02/2016    Past Surgical History:  Procedure Laterality Date  . ABDOMINAL HYSTERECTOMY    . APPENDECTOMY    . AQUEOUS SHUNT Right 05/06/2016   Procedure: AQUEOUS SHUNT;  Surgeon: Ronnell Freshwater, MD;  Location: [place];  Service: Ophthalmology;  Laterality: Right;  SCLERAL PATCH GRAFT AHMED FP7  . EYE SURGERY     glucacoma numerous times     Current Outpatient Rx  . Order #: [id number] Class: Historical Med  . Order #: [id number] Class: Print  . Order #: [id number] Class: Historical Med  . Order #: [id number] Class: Historical Med    Allergies Codeine; Sulfa antibiotics; and Penicillins  Family History  Problem Relation Age of Onset  . Heart disease Father   . Stroke Daughter     Social History Social History   Tobacco Use  . Smoking status: Never Smoker  . Smokeless tobacco: Never Used  . Tobacco comment: quit over 45 yrs ago  Substance Use Topics  . Alcohol use: No  . Drug use: No    Review of Systems Constitutional: No fever/chills.  Positive lightheadedness with syncope.  No tonic-clonic activity.  No recent malaise.  Positive recent fall.   Eyes: No visual changes.  Chronic left eye blindness.  No blurred or double vision. ENT: No sore throat. No congestion or rhinorrhea. Cardiovascular: Denies chest pain. Denies palpitations. Respiratory: Denies shortness of  breath.  No cough. Gastrointestinal: Positive lower abdominal cramping.  No nausea, no vomiting.  Positive loose nonbloody diarrhea.  No constipation. Genitourinary: Negative for dysuria. Musculoskeletal: Negative for back pain. Skin: Negative for rash. Neurological: Negative for headaches. No focal numbness, tingling or weakness.     ____________________________________________   PHYSICAL EXAM:  VITAL SIGNS: ED Triage Vitals  Enc Vitals Group     BP 07/04/17 1817 (!) 148/76     Pulse Rate 07/04/17 1817 71     Resp  07/04/17 1817 18     Temp 07/04/17 1817 97.6 F (36.4 C)     Temp Source 07/04/17 1817 Oral     SpO2 07/04/17 1817 99 %     Weight 07/04/17 1818 110 lb (49.9 kg)     Height 07/04/17 1818 5\' 2"  (1.575 m)     Head Circumference --      Peak Flow --      Pain Score 07/04/17 1817 3     Pain Loc --      Pain Edu? --      Excl. in [place]? --     Constitutional: Alert and answers questions appropriately. Well appearing for her stated age and in no acute distress. Eyes: Patient has opacification of the left eye.  EOMI. No scleral icterus. Head: Atraumatic. Nose: No congestion/rhinnorhea. Mouth/Throat: Mucous membranes are moist.  Neck: No stridor.  Supple.  No JVD.  No meningismus. Cardiovascular: Normal rate, regular rhythm. No murmurs, rubs or gallops.  Respiratory: Normal respiratory effort.  No accessory muscle use or retractions. Lungs CTAB.  No wheezes, rales or ronchi. Gastrointestinal: Soft, and nondistended.  Tender to palpation in the right lower quadrant.  Negative Murphy sign.  No guarding or rebound.  No peritoneal signs. Musculoskeletal: No LE edema.  Neurologic: Alert   Speech is clear.  Face and smile are symmetric.  EOMI.  Moves all extremities well. Skin:  Skin is warm, dry and intact. No rash noted. Psychiatric: Mood and affect are normal.   ____________________________________________   LABS (all labs ordered are listed, but only abnormal results are displayed)  Labs Reviewed  BASIC METABOLIC PANEL - Abnormal; Notable for the following components:      Result Value   Potassium 3.3 (*)    Glucose, Bld 123 (*)    BUN 28 (*)    All other components within normal limits  CBC - Abnormal; Notable for the following components:   RDW 15.3 (*)    All other components within normal limits  URINALYSIS, COMPLETE (UACMP) WITH MICROSCOPIC - Abnormal; Notable for the following components:   Color, Urine YELLOW (*)    APPearance CLEAR (*)    Squamous Epithelial / LPF 0-5 (*)     All other components within normal limits  GASTROINTESTINAL PANEL BY PCR, STOOL (REPLACES STOOL CULTURE)  C DIFFICILE QUICK SCREEN W PCR REFLEX   ____________________________________________  EKG  ED ECG REPORT I, Eula Listen, the attending physician, personally viewed and interpreted this ECG.   Date: 07/04/2017  EKG Time: 1830  Rate: 70  Rhythm: normal sinus rhythm  Axis: normal  Intervals:first-degree A-V block   ST&T Change: No STEMI  ____________________________________________  RADIOLOGY  Ct Head Wo Contrast  Result Date: 07/04/2017 CLINICAL DATA:  Syncope EXAM: CT HEAD WITHOUT CONTRAST TECHNIQUE: Contiguous axial images were obtained from the base of the skull through the vertex without intravenous contrast. COMPARISON:  01/05/2017 FINDINGS: Brain: Extensive chronic small vessel disease throughout the deep white matter. Mild  age related volume loss. No acute intracranial abnormality. Specifically, no hemorrhage, hydrocephalus, mass lesion, acute infarction, or significant intracranial injury. Vascular: No hyperdense vessel or unexpected calcification. Skull: No acute calvarial abnormality. Sinuses/Orbits: No acute finding. Other: None IMPRESSION: Extensive chronic small vessel disease throughout the deep white matter. No acute intracranial abnormality. Electronically Signed   By: Rolm Baptise M.D.   On: 07/04/2017 21:24   Ct Abdomen Pelvis W Contrast  Result Date: 07/04/2017 CLINICAL DATA:  Abdominal distention and abdominal pain EXAM: CT ABDOMEN AND PELVIS WITH CONTRAST TECHNIQUE: Multidetector CT imaging of the abdomen and pelvis was performed using the standard protocol following bolus administration of intravenous contrast. CONTRAST:  42mL ISOVUE-300 IOPAMIDOL (ISOVUE-300) INJECTION 61% COMPARISON:  CT abdomen pelvis 11/24/2016 FINDINGS: Lower chest: No pulmonary nodules or pleural effusion. No visible pericardial effusion. Hepatobiliary: Normal hepatic  contours and density. No visible biliary dilatation. Normal gallbladder. Pancreas: Normal contours without ductal dilatation. No peripancreatic fluid collection. Spleen: Normal. Adrenals/Urinary Tract: --Adrenal glands: Normal. --Right kidney/ureter: No hydronephrosis or perinephric stranding. No nephrolithiasis. No obstructing ureteral stones. --Left kidney/ureter: No hydronephrosis or perinephric stranding. No nephrolithiasis. No obstructing ureteral stones. --Urinary bladder: Unremarkable. Stomach/Bowel: --Stomach/Duodenum: No hiatal hernia or other gastric abnormality. Normal duodenal course and caliber. --Small bowel: No dilatation or inflammation. --Colon: Rectosigmoid diverticulosis without acute inflammation. Mild apparent wall thickening in of the descending and sigmoid colon is probably due to underdistention. No inflammatory change in the surrounding fat. --Appendix: Surgically absent. Vascular/Lymphatic: Atherosclerotic calcification is present within the non-aneurysmal abdominal aorta, without hemodynamically significant stenosis. No abdominal or pelvic lymphadenopathy. Reproductive: Status post hysterectomy. No adnexal mass. Musculoskeletal. No bony spinal canal stenosis or focal osseous abnormality. Other: None. IMPRESSION: 1. No acute abnormality of the abdomen or pelvis. 2. Rectosigmoid diverticulosis without evidence of acute diverticulitis. The mild apparent wall thickening of the descending colon and sigmoid colon is probably due to underdistention. 3.  Aortic Atherosclerosis (ICD10-I70.0). Electronically Signed   By: Ulyses Jarred M.D.   On: 07/04/2017 21:31    ____________________________________________   PROCEDURES  Procedure(s) performed: None  Procedures  Critical Care performed: No ____________________________________________   INITIAL IMPRESSION / ASSESSMENT AND PLAN / ED COURSE  Pertinent labs & imaging results that were available during my care of the patient were  reviewed by me and considered in my medical decision making (see chart for details).  83 y.o. female presenting with a syncopal episode associated with large bowel movements, with ongoing diarrhea.  The patient is hemodynamically stable and afebrile.  However, she does have some right lower quadrant tenderness on my examination so we will get a CT scan for further evaluation.  I will also send stool studies.  It is possible that the etiology of her syncope was a vasovagal event from her large bowel movement.  Do not see any evidence of ischemia or arrhythmia.  At this time, her resting blood pressure has normalized we will get orthostatic vital signs as well.  The patient will continue to receive intravenous fluids, and we will reevaluate her for final disposition.  I reviewed the patient's medical chart  ----------------------------------------- 10:55 PM on 07/04/2017 -----------------------------------------  The patient's workup in the emergency department has been reassuring.  She has a CT scan which does not show any acute abdominal process.  The patient was initially orthostatic on examination, and after 2 L of fluid, this has completely resolved.  She does not have UTI.  Her stool studies are negative for any infection including C. difficile.  She is mildly hypokalemic and I have supplemented this; she will have this rechecked by her primary care physician.  The patient CT had also does not show any acute injury or process.  At this time, I have reevaluated the patient and she is feeling much better.  She is safe for discharge home and I have discussed return precautions as well as follow-up instructions with the patient and her family.  ____________________________________________  FINAL CLINICAL IMPRESSION(S) / ED DIAGNOSES  Final diagnoses:  Hypokalemia  Vasovagal syncope  Orthostatic hypotension  Dehydration         NEW MEDICATIONS STARTED DURING THIS VISIT:  This SmartLink  is deprecated. Use AVSMEDLIST instead to display the medication list for a patient.    Eula Listen, MD 07/04/17 2255

## 2017-07-04 NOTE — ED Triage Notes (Signed)
Pt to 1H via EMS from home, EMS called for syncopal episode, pt had abd pain and felt like she needed to have BM, daughter was helping her to bathroom when pt had syncopal episode, initial BP 80/50, IV fluids given, BP improve to 148/60.  Pt in NAD, pt continues to complain of mild lower abd pain.

## 2017-07-05 NOTE — ED Notes (Signed)
Witnessed David, Chief Operating Officer 1.75 mL of fentanyl.

## 2017-07-05 NOTE — ED Notes (Signed)
Wasted 1.22mL of Fentanyl, witnessed by [place], Therapist, sports

## 2017-07-28 ENCOUNTER — Other Ambulatory Visit: Payer: Self-pay | Admitting: Internal Medicine

## 2017-07-28 DIAGNOSIS — R270 Ataxia, unspecified: Secondary | ICD-10-CM

## 2017-08-04 ENCOUNTER — Ambulatory Visit
Admission: RE | Admit: 2017-08-04 | Discharge: 2017-08-04 | Disposition: A | Discharge status: Home / Self Care | Payer: Medicare Other | Source: Ambulatory Visit | Attending: Internal Medicine | Admitting: Internal Medicine

## 2017-08-04 ENCOUNTER — Inpatient Hospital Stay
Admission: EM | Admit: 2017-08-04 | Discharge: 2017-08-06 | DRG: 641 | Disposition: A | Discharge status: Home / Self Care | Payer: Medicare Other | Attending: Family Medicine | Admitting: Family Medicine

## 2017-08-04 ENCOUNTER — Emergency Department: Payer: Medicare Other

## 2017-08-04 ENCOUNTER — Other Ambulatory Visit: Payer: Self-pay

## 2017-08-04 ENCOUNTER — Encounter: Payer: Self-pay | Admitting: *Deleted

## 2017-08-04 DIAGNOSIS — E871 Hypo-osmolality and hyponatremia: Principal | ICD-10-CM | POA: Diagnosis present

## 2017-08-04 DIAGNOSIS — Z87891 Personal history of nicotine dependence: Secondary | ICD-10-CM | POA: Diagnosis not present

## 2017-08-04 DIAGNOSIS — B029 Zoster without complications: Secondary | ICD-10-CM | POA: Diagnosis present

## 2017-08-04 DIAGNOSIS — I313 Pericardial effusion (noninflammatory): Secondary | ICD-10-CM | POA: Diagnosis present

## 2017-08-04 DIAGNOSIS — Z885 Allergy status to narcotic agent status: Secondary | ICD-10-CM

## 2017-08-04 DIAGNOSIS — Z880 Allergy status to penicillin: Secondary | ICD-10-CM

## 2017-08-04 DIAGNOSIS — Z823 Family history of stroke: Secondary | ICD-10-CM

## 2017-08-04 DIAGNOSIS — Z66 Do not resuscitate: Secondary | ICD-10-CM | POA: Diagnosis present

## 2017-08-04 DIAGNOSIS — F0150 Vascular dementia without behavioral disturbance: Secondary | ICD-10-CM | POA: Diagnosis present

## 2017-08-04 DIAGNOSIS — R4182 Altered mental status, unspecified: Secondary | ICD-10-CM

## 2017-08-04 DIAGNOSIS — F0280 Dementia in other diseases classified elsewhere without behavioral disturbance: Secondary | ICD-10-CM | POA: Diagnosis present

## 2017-08-04 DIAGNOSIS — Z90710 Acquired absence of both cervix and uterus: Secondary | ICD-10-CM | POA: Diagnosis not present

## 2017-08-04 DIAGNOSIS — Z85828 Personal history of other malignant neoplasm of skin: Secondary | ICD-10-CM

## 2017-08-04 DIAGNOSIS — Z882 Allergy status to sulfonamides status: Secondary | ICD-10-CM

## 2017-08-04 DIAGNOSIS — I10 Essential (primary) hypertension: Secondary | ICD-10-CM | POA: Diagnosis present

## 2017-08-04 DIAGNOSIS — H5462 Unqualified visual loss, left eye, normal vision right eye: Secondary | ICD-10-CM | POA: Diagnosis present

## 2017-08-04 DIAGNOSIS — H409 Unspecified glaucoma: Secondary | ICD-10-CM | POA: Diagnosis present

## 2017-08-04 DIAGNOSIS — Z79899 Other long term (current) drug therapy: Secondary | ICD-10-CM

## 2017-08-04 DIAGNOSIS — R270 Ataxia, unspecified: Secondary | ICD-10-CM | POA: Diagnosis present

## 2017-08-04 DIAGNOSIS — G309 Alzheimer's disease, unspecified: Secondary | ICD-10-CM | POA: Diagnosis present

## 2017-08-04 DIAGNOSIS — Z8249 Family history of ischemic heart disease and other diseases of the circulatory system: Secondary | ICD-10-CM | POA: Diagnosis not present

## 2017-08-04 DIAGNOSIS — Z8673 Personal history of transient ischemic attack (TIA), and cerebral infarction without residual deficits: Secondary | ICD-10-CM | POA: Diagnosis not present

## 2017-08-04 DIAGNOSIS — N390 Urinary tract infection, site not specified: Secondary | ICD-10-CM | POA: Diagnosis present

## 2017-08-04 LAB — COMPREHENSIVE METABOLIC PANEL
ALT: 17 U/L (ref 14–54)
ANION GAP: 9 (ref 5–15)
AST: 24 U/L (ref 15–41)
Albumin: 3.8 g/dL (ref 3.5–5.0)
Alkaline Phosphatase: 50 U/L (ref 38–126)
BILIRUBIN TOTAL: 2.1 mg/dL — AB (ref 0.3–1.2)
BUN: 14 mg/dL (ref 6–20)
CHLORIDE: 90 mmol/L — AB (ref 101–111)
CO2: 26 mmol/L (ref 22–32)
Calcium: 9.1 mg/dL (ref 8.9–10.3)
Creatinine, Ser: 0.57 mg/dL (ref 0.44–1.00)
GFR calc Af Amer: >60 mL/min (ref >60)
GFR calc non Af Amer: >60 mL/min (ref >60)
Glucose, Bld: 117 mg/dL — ABNORMAL HIGH (ref 65–99)
POTASSIUM: 4.1 mmol/L (ref 3.5–5.1)
SODIUM: 125 mmol/L — AB (ref 135–145)
TOTAL PROTEIN: 7.4 g/dL (ref 6.5–8.1)

## 2017-08-04 LAB — CBC
HEMATOCRIT: 37.6 % (ref 35.0–47.0)
HEMOGLOBIN: 13 g/dL (ref 12.0–16.0)
MCH: 31.9 pg (ref 26.0–34.0)
MCHC: 34.5 g/dL (ref 32.0–36.0)
MCV: 92.5 fL (ref 80.0–100.0)
Platelets: 376 10*3/uL (ref 150–440)
RBC: 4.06 MIL/uL (ref 3.80–5.20)
RDW: 15.1 % — ABNORMAL HIGH (ref 11.5–14.5)
WBC: 11.5 10*3/uL — ABNORMAL HIGH (ref 3.6–11.0)

## 2017-08-04 MED ORDER — CEFTRIAXONE SODIUM IN DEXTROSE 20 MG/ML IV SOLN
1.0000 g | INTRAVENOUS | Status: DC
  Filled 2017-08-04: qty 50

## 2017-08-04 MED ORDER — DEXTROSE 5 % IV SOLN
1.0000 g | INTRAVENOUS | Status: DC
  Filled 2017-08-04: qty 10

## 2017-08-04 NOTE — ED Notes (Signed)
Bladder scanned 211cc - Dr Cinda Quest notified

## 2017-08-04 NOTE — ED Notes (Signed)
Patient transported to CT 

## 2017-08-04 NOTE — ED Provider Notes (Signed)
[facility] Emergency Department Provider Note   ____________________________________________   First MD Initiated Contact with Patient 08/04/17 2102     (approximate)  I have reviewed the triage vital signs and the nursing notes.   HISTORY  Chief Complaint Altered Mental Status    HPI Karen Krueger is a 84 y.o. female Patient has had shingles for a week and a half on her left leg. Patient also had a UTI. Patient was put on Cipro for a UTI. Today she began having a lot of left leg weakness and falling. Her daughter called Dr. Doy Hutching who sent her to the clinic who sent her to the emergency room. Dr. Doy Hutching stopped the Cipro immediately. She told her daughter that she had some pain in her leg. Patient tells me she doesn't have any pain in her leg. Patient also told her daughter she had some pain in the back. Patient tells me she doesn't have any pain in the back and there is no pain on palpation of her back. Patient has only urinated once today and has been drinking lots of fluid. daughter reports left leg get weak on Sunday   Past Medical History:  Diagnosis Date  . Blind left eye   . Cancer ([place])    previous skin cancer   . Dementia   . Glaucoma    blind in Left eye   . Hypertension    controlled with medication   . Stroke [facility])    two previous strokes, 2005, 2011     Patient Active Problem List   Diagnosis Date Noted  . Hyponatremia 08/04/2017  . UTI (urinary tract infection) 08/04/2017  . Shingles outbreak 08/04/2017  . Altered mental status 01/05/2017  . Dementia due to Alzheimer's disease 08/03/2016  . Fall 08/02/2016  . Acute encephalopathy 08/02/2016  . HTN (hypertension) 08/02/2016  . Glaucoma 08/02/2016  . Elevated troponin 08/02/2016    Past Surgical History:  Procedure Laterality Date  . ABDOMINAL HYSTERECTOMY    . APPENDECTOMY    . AQUEOUS SHUNT Right 05/06/2016   Procedure: AQUEOUS SHUNT;  Surgeon: Ronnell Freshwater, MD;  Location: [place];  Service: Ophthalmology;  Laterality: Right;  SCLERAL PATCH GRAFT AHMED FP7  . EYE SURGERY     glucacoma numerous times     Prior to Admission medications   Medication Sig Start Date End Date Taking? Authorizing Provider  latanoprost (XALATAN) 0.005 % ophthalmic solution Place 1 drop into both eyes at bedtime.   Yes [provider]  naproxen (NAPROSYN) 500 MG tablet Take 1 tablet (500 mg total) by mouth 2 (two) times daily with a meal. 08/12/16  Yes Lavonia Drafts, MD  timolol (TIMOPTIC) 0.5 % ophthalmic solution Place 1 drop into the right eye 2 (two) times daily.   Yes [provider]  valACYclovir (VALTREX) 1000 MG tablet Take 1,000 mg by mouth 3 (three) times daily. 07/26/17 08/05/17 Yes [provider]  verapamil (CALAN-SR) 180 MG CR tablet Take 180 mg by mouth 2 (two) times daily.   Yes [provider]    Allergies Codeine; Sulfa antibiotics; and Penicillins  Family History  Problem Relation Age of Onset  . Heart disease Father   . Stroke Daughter     Social History Social History   Tobacco Use  . Smoking status: Never Smoker  . Smokeless tobacco: Never Used  . Tobacco comment: quit over 45 yrs ago  Substance Use Topics  . Alcohol use: No  .  Drug use: No    Review of Systems  Constitutional: No fever/chills Eyes: No visual changes. ENT: No sore throat. Cardiovascular: Denies chest pain. Respiratory: Denies shortness of breath. Gastrointestinal: No abdominal pain.  No nausea, no vomiting.  No diarrhea.  No constipation. Genitourinary: Negative for dysuria. Musculoskeletal: Negative for back painalthough patient had told her daughter earlier that her back was hurting. Skin: Negative for rashexcept for the left leg where she had shingles everything is now scabbed over. Neurological: Negative for headaches  Allergic/Immunilogical:  **}  ____________________________________________   PHYSICAL EXAM:  VITAL SIGNS: ED Triage Vitals [08/04/17 1842]  Enc Vitals Group     BP 127/75     Pulse Rate 87     Resp      Temp (!) 97.5 F (36.4 C)     Temp Source Oral     SpO2 97 %     Weight 103 lb (46.7 kg)     Height 5\' 2"  (1.575 m)     Head Circumference      Peak Flow      Pain Score      Pain Loc      Pain Edu?      Excl. in [place]?    Constitutional: Alert and oriented. Well appearing and in no acute distress. Eyes: Conjunctivae are normal. left eye is chronically blind Head: Atraumatic. Nose: No congestion/rhinnorhea. Mouth/Throat: Mucous membranes are moist.  Oropharynx non-erythematous. Neck: No stridor. jCardiovascular: Normal rate, regular rhythm. Grossly normal heart sounds.  Good peripheral circulation. Respiratory: Normal respiratory effort.  No retractions. Lungs CTAB. Gastrointestinal: Soft and nontender. No distention. No abdominal bruits. No CVA tenderness. Musculoskeletal: No lower extremity tenderness nor edema.  No joint effusions. Neurologic:  Normal speech and language. cranial nerves II through XII are intact motor strength is 5 over 5 everywhere except for the left leg dorsiflexion of the hip and extension of the knee seemed to be the weakest although the whole leg is weak..  Skin:  Skin is warm, dry and intact. No rash noted. Psychiatric: Mood and affect are normal. Speech and behavior are normal.  ____________________________________________   LABS (all labs ordered are listed, but only abnormal results are displayed)  Labs Reviewed  COMPREHENSIVE METABOLIC PANEL - Abnormal; Notable for the following components:      Result Value   Sodium 125 (*)    Chloride 90 (*)    Glucose, Bld 117 (*)    Total Bilirubin 2.1 (*)    All other components within normal limits  CBC - Abnormal; Notable for the following components:   WBC 11.5 (*)    RDW 15.1 (*)    All other components within normal  limits  URINALYSIS, COMPLETE (UACMP) WITH MICROSCOPIC   ____________________________________________  EKG  pending ____________________________________________  RADIOLOGY  Ct Abdomen Pelvis Wo Contrast  Result Date: 08/04/2017 CLINICAL DATA:  84 year old female with abdominal distention. EXAM: CT ABDOMEN AND PELVIS WITHOUT CONTRAST TECHNIQUE: Multidetector CT imaging of the abdomen and pelvis was performed following the standard protocol without IV contrast. COMPARISON:  Abdominal CT dated 07/04/2017 FINDINGS: Evaluation of this exam is limited in the absence of intravenous contrast. Evaluation is also limited due to motion artifact. Lower chest: Borderline cardiomegaly. Partially visualized pericardial effusion measuring approximately 9 mm in thickness. Correlation with clinical exam and echocardiogram recommended. The pericardial effusion appears new or increased compared to the CT of 07/04/2017. No intra-abdominal free air.  No free fluid. Hepatobiliary: No focal liver abnormality is seen. No  gallstones, gallbladder wall thickening, or biliary dilatation. Pancreas: Unremarkable. No pancreatic ductal dilatation or surrounding inflammatory changes. Spleen: Normal in size without focal abnormality. Adrenals/Urinary Tract: Adrenal glands are unremarkable. Kidneys are normal, without renal calculi, focal lesion, or hydronephrosis. Bladder is unremarkable. Stomach/Bowel: There is sigmoid diverticulosis and scattered colonic diverticula without active inflammatory changes. There is no evidence of bowel obstruction or active inflammation. Appendectomy. Vascular/Lymphatic: There is moderate aortoiliac atherosclerotic disease. The abdominal aorta and IVC are otherwise unremarkable on this noncontrast CT. No portal venous gas. There is no adenopathy. Reproductive: Hysterectomy.  No pelvic mass. Other: None Musculoskeletal: Osteopenia with degenerative changes of the spine. No acute osseous pathology.  IMPRESSION: 1. No acute intra-abdominopelvic pathology. No bowel obstruction or active inflammation. Colonic diverticulosis. 2. Partially visualized pericardial effusion, new or increased compared to the prior CT. Correlation with echocardiogram recommended. 3.  Aortic Atherosclerosis (ICD10-I70.0). Electronically Signed   By: Anner Crete M.D.   On: 08/04/2017 21:36   Mr Brain Wo Contrast  Result Date: 08/04/2017 CLINICAL DATA:  Syncopal episode.  Abdominal pain. EXAM: MRI HEAD WITHOUT CONTRAST TECHNIQUE: Multiplanar, multiecho pulse sequences of the brain and surrounding structures were obtained without intravenous contrast. COMPARISON:  CT 07/04/2017.  MRI 08/02/2016. FINDINGS: Brain: Diffusion imaging does not show any acute or subacute infarction. There chronic small-vessel ischemic changes of the pons. No focal cerebellar insult. Cerebral hemispheres show confluent chronic small vessel ischemic changes of the deep and subcortical white matter. There are old small vessel changes the thalami. No cortical or large vessel territory infarction. No mass lesion, hemorrhage, hydrocephalus or extra-axial collection. Vascular: Major vessels at the base of the brain show flow. Skull and upper cervical spine: Negative Sinuses/Orbits: Clear/normal Other: None IMPRESSION: No acute finding. Advanced chronic small-vessel ischemic changes of the pons, thalami and hemispheric white matter. Electronically Signed   By: Nelson Chimes M.D.   On: 08/04/2017 13:39   Mr Lumbar Spine Wo Contrast  Result Date: 08/04/2017 CLINICAL DATA:  Decreased urine output, not ambulating. LEFT leg shingles for 10 days. History of stroke and dementia. EXAM: MRI LUMBAR SPINE WITHOUT CONTRAST TECHNIQUE: Multiplanar, multisequence MR imaging of the lumbar spine was performed. No intravenous contrast was administered. COMPARISON:  CT abdomen and pelvis August 04, 2017 at 2114 hours FINDINGS: SEGMENTATION: For the purposes of this report, the  last well-formed intervertebral disc is reported as L5-S1. small S1-2 disc. ALIGNMENT: Maintained lumbar lordosis. No malalignment. VERTEBRAE:Vertebral bodies are intact. Mild L2-3 disc height loss associated with mild acute to subacute discogenic endplate changes. Additional mild subacute discogenic endplate changes X8-3 through L5-S1. Subacute L2 inferior endplate large Schmorl's node. No suspicious bone marrow signal. CONUS MEDULLARIS AND CAUDA EQUINA: Conus medullaris terminates at L1-2 and demonstrates normal morphology and signal characteristics. Cauda equina is normal. PARASPINAL AND OTHER SOFT TISSUES: Included prevertebral and paraspinal soft tissues are normal. DISC LEVELS: L1-2: No disc bulge, canal stenosis nor neural foraminal narrowing. L2-3: Small broad-based disc bulge eccentric laterally. No canal stenosis. Mild RIGHT, minimal LEFT neural foraminal narrowing. L3-4: Small broad-based disc bulge and small LEFT extraforaminal disc protrusion. Mild facet arthropathy and ligamentum flavum redundancy without canal stenosis. Minimal RIGHT, mild-to-moderate LEFT neural foraminal narrowing. L4-5: Small broad-based disc bulge, small RIGHT extraforaminal disc protrusion encroaches upon the exited RIGHT L4 nerve. Mild facet arthropathy and ligamentum flavum redundancy without canal stenosis. Moderate RIGHT, mild to moderate LEFT neural foraminal narrowing. L5-S1: 3 mm broad-based disc bulge, mild facet arthropathy without canal stenosis. Moderate to severe RIGHT, mild-to-moderate  LEFT neural foraminal narrowing. IMPRESSION: 1. Degenerative change of the lumbar spine without fracture or malalignment. 2. No canal stenosis. 3. Neural foraminal narrowing L2-3 through L5-S1: Moderate to severe on the RIGHT at L5-S1. Electronically Signed   By: Elon Alas M.D.   On: 08/04/2017 23:38   CT the abdomen shows what appears to be new pericardial effusion. Otherwise MRI of the brain doesn't show  anything. ____________________________________________   PROCEDURES  Procedure(s) performed:   Procedures  Critical Care performed:   ____________________________________________   INITIAL IMPRESSION / ASSESSMENT AND PLAN / ED COURSE  patient comes in with altered mental status per family Sided weakness in the leg onlyand a sodium of 125. His possible the sodium is doing most of the problem we will order a MRI of the L-spine just to make sure nothing is going on there MRI of the head was negative as I noted she will also need a pericardial effusion looked at. She does not have any symptoms of A knot at this time.  Clinical Course as of Aug 04 2342  Mon Aug 04, 2017  2049 MCV: 92.5 [PM]  2101 Hemoglobin: 13.0 [PM]    Clinical Course User Index [PM] Nena Polio, MD     ____________________________________________   FINAL CLINICAL IMPRESSION(S) / ED DIAGNOSES  Final diagnoses:  Altered mental status, unspecified altered mental status type  Hyponatremia   also left leg weakness Also new pericardial effusion    ED Discharge Orders    None       Note:  This document was prepared using Dragon voice recognition software and may include unintentional dictation errors.    Nena Polio, MD 08/04/17 [phone]

## 2017-08-04 NOTE — ED Notes (Signed)
Patient transported to MRI 

## 2017-08-04 NOTE — H&P (Signed)
[place] [facility] [place] NAME: Karen Krueger    MR#:  [id number]  DATE OF BIRTH:  11/21/1931  DATE OF ADMISSION:  08/04/2017  PRIMARY CARE PHYSICIAN: Idelle Crouch, MD   REQUESTING/REFERRING PHYSICIAN: Cinda Quest, MD  CHIEF COMPLAINT:   Chief Complaint  Patient presents with  . Altered Mental Status    HISTORY OF PRESENT ILLNESS:  Karen Krueger  is a 84 y.o. female who presents with weakness at home.  Patient is unable to contribute to her HPI much due to confusion.  Patient's family states that she developed a shingles outbreak about 10 days ago.  She was seen in the clinic and put on valacyclovir.  Then she was diagnosed with UTI 3 days ago and put on ciprofloxacin.  Since that time her weakness has progressed, especially on her left leg where the shingles outbreak is.  She came back to the clinic today and had an MRI which was largely within normal limits, with just age-related changes, but no acute pathology.  She was sent here from clinic for further evaluation.  Workup here in the ED shows hyponatremia, without much other acute pathology.  Hospitalist were called for admission.  PAST MEDICAL HISTORY:   Past Medical History:  Diagnosis Date  . Blind left eye   . Cancer ([place])    previous skin cancer   . Dementia   . Glaucoma    blind in Left eye   . Hypertension    controlled with medication   . Stroke [facility])    two previous strokes, 2005, 2011     PAST SURGICAL HISTORY:   Past Surgical History:  Procedure Laterality Date  . ABDOMINAL HYSTERECTOMY    . APPENDECTOMY    . AQUEOUS SHUNT Right 05/06/2016   Procedure: AQUEOUS SHUNT;  Surgeon: Ronnell Freshwater, MD;  Location: [place];  Service: Ophthalmology;  Laterality: Right;  SCLERAL PATCH GRAFT AHMED FP7  . EYE SURGERY     glucacoma numerous times     SOCIAL HISTORY:   Social History   Tobacco Use  . Smoking status: Never Smoker  .  Smokeless tobacco: Never Used  . Tobacco comment: quit over 45 yrs ago  Substance Use Topics  . Alcohol use: No    FAMILY HISTORY:   Family History  Problem Relation Age of Onset  . Heart disease Father   . Stroke Daughter     DRUG ALLERGIES:   Allergies  Allergen Reactions  . Codeine Nausea And Vomiting  . Sulfa Antibiotics Other (See Comments)    Unknown   . Penicillins Nausea Only    MEDICATIONS AT HOME:   Prior to Admission medications   Medication Sig Start Date End Date Taking? Authorizing Provider  latanoprost (XALATAN) 0.005 % ophthalmic solution Place 1 drop into both eyes at bedtime.   Yes [provider]  naproxen (NAPROSYN) 500 MG tablet Take 1 tablet (500 mg total) by mouth 2 (two) times daily with a meal. 08/12/16  Yes Lavonia Drafts, MD  timolol (TIMOPTIC) 0.5 % ophthalmic solution Place 1 drop into the right eye 2 (two) times daily.   Yes [provider]  valACYclovir (VALTREX) 1000 MG tablet Take 1,000 mg by mouth 3 (three) times daily. 07/26/17 08/05/17 Yes [provider]  verapamil (CALAN-SR) 180 MG CR tablet Take 180 mg by mouth 2 (two) times daily.   Yes [provider]    REVIEW OF SYSTEMS:  Review of Systems  Unable to perform ROS: Acuity of condition     VITAL SIGNS:   Vitals:   08/04/17 1842  BP: 127/75  Pulse: 87  Temp: (!) 97.5 F (36.4 C)  TempSrc: Oral  SpO2: 97%  Weight: 46.7 kg (103 lb)  Height: 5\' 2"  (1.575 m)   Wt Readings from Last 3 Encounters:  08/04/17 46.7 kg (103 lb)  07/04/17 49.9 kg (110 lb)  01/05/17 49.9 kg (110 lb)    PHYSICAL EXAMINATION:  Physical Exam  Vitals reviewed. Constitutional: She appears well-developed and well-nourished. No distress.  HENT:  Head: Normocephalic and atraumatic.  Mouth/Throat: Oropharynx is clear and moist.  Eyes: Conjunctivae and EOM are normal. Pupils are equal, round, and reactive to light. No scleral icterus.  Neck: Normal range of motion.  Neck supple. No JVD present. No thyromegaly present.  Cardiovascular: Normal rate, regular rhythm and intact distal pulses. Exam reveals no gallop and no friction rub.  No murmur heard. Respiratory: Effort normal and breath sounds normal. No respiratory distress. She has no wheezes. She has no rales.  GI: Soft. Bowel sounds are normal. She exhibits no distension. There is no tenderness.  Musculoskeletal: Normal range of motion. She exhibits no edema.  No arthritis, no gout  Lymphadenopathy:    She has no cervical adenopathy.  Neurological: She is alert. No cranial nerve deficit.  Unable to fully assess due to patient condition  Skin: Skin is warm and dry. Rash (dry crusted lesions left inner thigh) noted. No erythema.  Psychiatric:  Unable to fully assess due to patient condition    LABORATORY PANEL:   CBC Recent Labs  Lab 08/04/17 1853  WBC 11.5*  HGB 13.0  HCT 37.6  PLT 376   ------------------------------------------------------------------------------------------------------------------  Chemistries  Recent Labs  Lab 08/04/17 1853  NA 125*  K 4.1  CL 90*  CO2 26  GLUCOSE 117*  BUN 14  CREATININE 0.57  CALCIUM 9.1  AST 24  ALT 17  ALKPHOS 50  BILITOT 2.1*   ------------------------------------------------------------------------------------------------------------------  Cardiac Enzymes No results for input(s): TROPONINI in the last 168 hours. ------------------------------------------------------------------------------------------------------------------  RADIOLOGY:  Ct Abdomen Pelvis Wo Contrast  Result Date: 08/04/2017 CLINICAL DATA:  84 year old female with abdominal distention. EXAM: CT ABDOMEN AND PELVIS WITHOUT CONTRAST TECHNIQUE: Multidetector CT imaging of the abdomen and pelvis was performed following the standard protocol without IV contrast. COMPARISON:  Abdominal CT dated 07/04/2017 FINDINGS: Evaluation of this exam is limited in the absence of  intravenous contrast. Evaluation is also limited due to motion artifact. Lower chest: Borderline cardiomegaly. Partially visualized pericardial effusion measuring approximately 9 mm in thickness. Correlation with clinical exam and echocardiogram recommended. The pericardial effusion appears new or increased compared to the CT of 07/04/2017. No intra-abdominal free air.  No free fluid. Hepatobiliary: No focal liver abnormality is seen. No gallstones, gallbladder wall thickening, or biliary dilatation. Pancreas: Unremarkable. No pancreatic ductal dilatation or surrounding inflammatory changes. Spleen: Normal in size without focal abnormality. Adrenals/Urinary Tract: Adrenal glands are unremarkable. Kidneys are normal, without renal calculi, focal lesion, or hydronephrosis. Bladder is unremarkable. Stomach/Bowel: There is sigmoid diverticulosis and scattered colonic diverticula without active inflammatory changes. There is no evidence of bowel obstruction or active inflammation. Appendectomy. Vascular/Lymphatic: There is moderate aortoiliac atherosclerotic disease. The abdominal aorta and IVC are otherwise unremarkable on this noncontrast CT. No portal venous gas. There is no adenopathy. Reproductive: Hysterectomy.  No pelvic mass. Other: None Musculoskeletal: Osteopenia with degenerative changes of the spine. No acute osseous  pathology. IMPRESSION: 1. No acute intra-abdominopelvic pathology. No bowel obstruction or active inflammation. Colonic diverticulosis. 2. Partially visualized pericardial effusion, new or increased compared to the prior CT. Correlation with echocardiogram recommended. 3.  Aortic Atherosclerosis (ICD10-I70.0). Electronically Signed   By: Anner Crete M.D.   On: 08/04/2017 21:36   Mr Brain Wo Contrast  Result Date: 08/04/2017 CLINICAL DATA:  Syncopal episode.  Abdominal pain. EXAM: MRI HEAD WITHOUT CONTRAST TECHNIQUE: Multiplanar, multiecho pulse sequences of the brain and surrounding  structures were obtained without intravenous contrast. COMPARISON:  CT 07/04/2017.  MRI 08/02/2016. FINDINGS: Brain: Diffusion imaging does not show any acute or subacute infarction. There chronic small-vessel ischemic changes of the pons. No focal cerebellar insult. Cerebral hemispheres show confluent chronic small vessel ischemic changes of the deep and subcortical white matter. There are old small vessel changes the thalami. No cortical or large vessel territory infarction. No mass lesion, hemorrhage, hydrocephalus or extra-axial collection. Vascular: Major vessels at the base of the brain show flow. Skull and upper cervical spine: Negative Sinuses/Orbits: Clear/normal Other: None IMPRESSION: No acute finding. Advanced chronic small-vessel ischemic changes of the pons, thalami and hemispheric white matter. Electronically Signed   By: Nelson Chimes M.D.   On: 08/04/2017 13:39    EKG:   Orders placed or performed during the hospital encounter of 07/04/17  . ED EKG  . ED EKG  . EKG 12-Lead  . EKG 12-Lead    IMPRESSION AND PLAN:  Principal Problem:   UTI (urinary tract infection) -still pending UA here in the ED, though this would potentially be falsely negative at this time given that she has been on ciprofloxacin for 2 days.  We will place her on IV Rocephin while here.  Her urine culture from a couple of days ago showed pansensitive E. coli. Active Problems:   Hyponatremia -in conjunction with her UTI this is likely the cause of her weakness and increased confusion.  Sodium is 125, down from a normal level last month.  UTI is likely contributed to this.  We will hydrate her with IV fluids tonight and monitor for improvement.   HTN (hypertension) -continue home meds   Shingles outbreak -lesions are dried and crusted so the patient is no longer contagious.  She is nearly finished her course of valacyclovir.  We will continue this for 1 more day to complete her course.   Glaucoma -home dose  eyedrops  All the records are reviewed and case discussed with ED provider. Management plans discussed with the patient and/or family.  DVT PROPHYLAXIS: SubQ lovenox  GI PROPHYLAXIS: None  ADMISSION STATUS: Inpatient  CODE STATUS: DNR Code Status History    Date Active Date Inactive Code Status Order ID Comments User Context   01/05/2017 17:09 01/06/2017 19:52 DNR [id number]  Nicholes Mango, MD Inpatient   08/02/2016 21:57 08/03/2016 20:56 Full Code [id number]  Lance Coon, MD ED    Questions for Most Recent Historical Code Status (Order [id number])    Question Answer Comment   In the event of cardiac or respiratory ARREST Do not call a "code blue"    In the event of cardiac or respiratory ARREST Do not perform Intubation, CPR, defibrillation or ACLS    In the event of cardiac or respiratory ARREST Use medication by any route, position, wound care, and other measures to relive pain and suffering. May use oxygen, suction and manual treatment of airway obstruction as needed for comfort.    Comments RN may pronounce  TOTAL TIME TAKING CARE OF THIS PATIENT: 45 minutes.   Jannifer Franklin, Marilyn Nihiser [place] 08/04/2017, 11:29 PM  [facility]  Office  [phone]  CC: Primary care physician; Idelle Crouch, MD  Note:  This document was prepared using Dragon voice recognition software and may include unintentional dictation errors.

## 2017-08-04 NOTE — Progress Notes (Signed)
Pharmacy Antibiotic Note  Karen Krueger is a 84 y.o. female admitted on 08/04/2017 with UTI.  Pharmacy has been consulted for ceftriaxone dosing.  Plan: Will start ceftriaxone 1g IV daily  Height: 5\' 2"  (157.5 cm) Weight: 103 lb (46.7 kg) IBW/kg (Calculated) : 50.1  Temp (24hrs), Avg:97.5 F (36.4 C), Min:97.5 F (36.4 C), Max:97.5 F (36.4 C)  Recent Labs  Lab 08/04/17 1853  WBC 11.5*  CREATININE 0.57    Estimated Creatinine Clearance: 39.3 mL/min (by C-G formula based on SCr of 0.57 mg/dL).    Allergies  Allergen Reactions  . Codeine Nausea And Vomiting  . Sulfa Antibiotics Other (See Comments)    Unknown   . Penicillins Nausea Only    Thank you for allowing pharmacy to be a part of this patient's care.  Tobie Lords, PharmD, BCPS Clinical Pharmacist 08/04/2017

## 2017-08-04 NOTE — ED Triage Notes (Addendum)
Pt to triage via wheelchair.  Daughter states her mother is not walking, decreased urine output.  No v/d.  Pt has shingles to left leg for 1.5 weeks.  Pt sent from [facility] for eval.

## 2017-08-04 NOTE — ED Notes (Signed)
Resumed care from teresa rn  Pt alert. Foley cath inserted without diff.

## 2017-08-05 ENCOUNTER — Other Ambulatory Visit: Payer: Self-pay

## 2017-08-05 LAB — URINALYSIS, COMPLETE (UACMP) WITH MICROSCOPIC
BACTERIA UA: NONE SEEN
BILIRUBIN URINE: NEGATIVE
GLUCOSE, UA: NEGATIVE mg/dL
HGB URINE DIPSTICK: NEGATIVE
KETONES UR: NEGATIVE mg/dL
LEUKOCYTES UA: NEGATIVE
NITRITE: NEGATIVE
Protein, ur: NEGATIVE mg/dL
Specific Gravity, Urine: 1.011 (ref 1.005–1.030)
Squamous Epithelial / LPF: NONE SEEN
pH: 6 (ref 5.0–8.0)

## 2017-08-05 LAB — CBC
HEMATOCRIT: 39.1 % (ref 35.0–47.0)
HEMOGLOBIN: 13.3 g/dL (ref 12.0–16.0)
MCH: 31.5 pg (ref 26.0–34.0)
MCHC: 34.1 g/dL (ref 32.0–36.0)
MCV: 92.5 fL (ref 80.0–100.0)
Platelets: 344 10*3/uL (ref 150–440)
RBC: 4.23 MIL/uL (ref 3.80–5.20)
RDW: 14.7 % — ABNORMAL HIGH (ref 11.5–14.5)
WBC: 8.8 10*3/uL (ref 3.6–11.0)

## 2017-08-05 LAB — SODIUM
Sodium: 127 mmol/L — ABNORMAL LOW (ref 135–145)
Sodium: 129 mmol/L — ABNORMAL LOW (ref 135–145)
Sodium: 132 mmol/L — ABNORMAL LOW (ref 135–145)

## 2017-08-05 LAB — BASIC METABOLIC PANEL
ANION GAP: 9 (ref 5–15)
BUN: 13 mg/dL (ref 6–20)
CHLORIDE: 89 mmol/L — AB (ref 101–111)
CO2: 26 mmol/L (ref 22–32)
Calcium: 8.8 mg/dL — ABNORMAL LOW (ref 8.9–10.3)
Creatinine, Ser: 0.53 mg/dL (ref 0.44–1.00)
GFR calc Af Amer: >60 mL/min (ref >60)
GFR calc non Af Amer: >60 mL/min (ref >60)
GLUCOSE: 102 mg/dL — AB (ref 65–99)
POTASSIUM: 3.6 mmol/L (ref 3.5–5.1)
Sodium: 124 mmol/L — ABNORMAL LOW (ref 135–145)

## 2017-08-05 MED ORDER — SODIUM CHLORIDE 0.9 % IV SOLN
INTRAVENOUS | Status: AC
  Administered 2017-08-05 (×2): via INTRAVENOUS

## 2017-08-05 MED ORDER — ONDANSETRON HCL 4 MG PO TABS: 4.0000 mg | ORAL_TABLET | Freq: Four times a day (QID) | ORAL | Status: DC | PRN

## 2017-08-05 MED ORDER — ENSURE ENLIVE PO LIQD
237.0000 mL | Freq: Two times a day (BID) | ORAL | Status: DC
  Administered 2017-08-05 (×2): 237 mL via ORAL

## 2017-08-05 MED ORDER — SODIUM CHLORIDE 1 G PO TABS
2.0000 g | ORAL_TABLET | Freq: Two times a day (BID) | ORAL | Status: DC
  Administered 2017-08-05 (×2): 2 g via ORAL
  Filled 2017-08-05 (×4): qty 2

## 2017-08-05 MED ORDER — TIMOLOL MALEATE 0.5 % OP SOLN
1.0000 [drp] | Freq: Two times a day (BID) | OPHTHALMIC | Status: DC
  Administered 2017-08-05 (×2): 1 [drp] via OPHTHALMIC
  Filled 2017-08-05: qty 5

## 2017-08-05 MED ORDER — VALACYCLOVIR HCL 500 MG PO TABS
1000.0000 mg | ORAL_TABLET | Freq: Three times a day (TID) | ORAL | Status: DC
  Administered 2017-08-05 (×3): 1000 mg via ORAL
  Filled 2017-08-05 (×5): qty 2

## 2017-08-05 MED ORDER — LATANOPROST 0.005 % OP SOLN
1.0000 [drp] | Freq: Every day | OPHTHALMIC | Status: DC
  Administered 2017-08-05: 1 [drp] via OPHTHALMIC
  Filled 2017-08-05: qty 2.5

## 2017-08-05 MED ORDER — ENOXAPARIN SODIUM 40 MG/0.4ML ~~LOC~~ SOLN
40.0000 mg | SUBCUTANEOUS | Status: DC
  Administered 2017-08-05 – 2017-08-06 (×2): 40 mg via SUBCUTANEOUS
  Filled 2017-08-05 (×2): qty 0.4

## 2017-08-05 MED ORDER — DEXTROSE 5 % IV SOLN
1.0000 g | INTRAVENOUS | Status: DC
  Administered 2017-08-05: 1 g via INTRAVENOUS
  Filled 2017-08-05: qty 10

## 2017-08-05 MED ORDER — ACETAMINOPHEN 650 MG RE SUPP: 650.0000 mg | Freq: Four times a day (QID) | RECTAL | Status: DC | PRN

## 2017-08-05 MED ORDER — ACETAMINOPHEN 325 MG PO TABS: 650.0000 mg | ORAL_TABLET | Freq: Four times a day (QID) | ORAL | Status: DC | PRN

## 2017-08-05 MED ORDER — ONDANSETRON HCL 4 MG/2ML IJ SOLN: 4.0000 mg | Freq: Four times a day (QID) | INTRAMUSCULAR | Status: DC | PRN

## 2017-08-05 MED ORDER — VERAPAMIL HCL ER 180 MG PO TBCR
180.0000 mg | EXTENDED_RELEASE_TABLET | Freq: Two times a day (BID) | ORAL | Status: DC
  Administered 2017-08-05: 180 mg via ORAL
  Filled 2017-08-05 (×4): qty 1

## 2017-08-05 NOTE — Progress Notes (Signed)
[facility] [place] NAME: Karen Krueger    MR#:  [id number]  DATE OF BIRTH:  [birth year]  SUBJECTIVE:  CHIEF COMPLAINT:   Chief Complaint  Patient presents with  . Altered Mental Status  Patient is confused and disoriented, Foley in place  REVIEW OF SYSTEMS:  CONSTITUTIONAL: ROS   Unable to be obtained given confusion/disorientation  DRUG ALLERGIES:   Allergies  Allergen Reactions  . Codeine Nausea And Vomiting  . Sulfa Antibiotics Other (See Comments)    Unknown   . Penicillins Nausea Only    VITALS:  Blood pressure 120/63, pulse 75, temperature 98.3 F (36.8 C), temperature source Oral, resp. rate 16, height 5\' 2"  (1.575 m), weight 46.7 kg (103 lb), SpO2 98 %.  PHYSICAL EXAMINATION:  GENERAL:  84 y.o.-year-old patient lying in the bed with no acute distress.  EYES: Pupils equal, round, reactive to light and accommodation. No scleral icterus. Extraocular muscles intact.  Frail-appearing HEENT: Head atraumatic, normocephalic. Oropharynx and nasopharynx clear.  NECK:  Supple, no jugular venous distention. No thyroid enlargement, no tenderness.  LUNGS: Normal breath sounds bilaterally, no wheezing, rales,rhonchi or crepitation. No use of accessory muscles of respiration.  CARDIOVASCULAR: S1, S2 normal. No murmurs, rubs, or gallops.  ABDOMEN: Soft, nontender, nondistended. Bowel sounds present. No organomegaly or mass.  EXTREMITIES: No pedal edema, cyanosis, or clubbing.  NEUROLOGIC: Cranial nerves II through XII are intact. MAES.  Confused, disoriented gait not checked.  PSYCHIATRIC: The patient is awake, alert, confused/disoriented.  SKIN: No obvious rash, lesion, or ulcer.   Physical Exam LABORATORY PANEL:   CBC Recent Labs  Lab 08/05/17 0218  WBC 8.8  HGB 13.3  HCT 39.1  PLT 344   ------------------------------------------------------------------------------------------------------------------  Chemistries   Recent Labs  Lab 08/04/17 1853 08/05/17 0218  08/05/17 1516  NA 125* 124*   < > 129*  K 4.1 3.6  --   --   CL 90* 89*  --   --   CO2 26 26  --   --   GLUCOSE 117* 102*  --   --   BUN 14 13  --   --   CREATININE 0.57 0.53  --   --   CALCIUM 9.1 8.8*  --   --   AST 24  --   --   --   ALT 17  --   --   --   ALKPHOS 50  --   --   --   BILITOT 2.1*  --   --   --    < > = values in this interval not displayed.   ------------------------------------------------------------------------------------------------------------------  Cardiac Enzymes No results for input(s): TROPONINI in the last 168 hours. ------------------------------------------------------------------------------------------------------------------  RADIOLOGY:  Ct Abdomen Pelvis Wo Contrast  Result Date: 08/04/2017 CLINICAL DATA:  84 year old female with abdominal distention. EXAM: CT ABDOMEN AND PELVIS WITHOUT CONTRAST TECHNIQUE: Multidetector CT imaging of the abdomen and pelvis was performed following the standard protocol without IV contrast. COMPARISON:  Abdominal CT dated 07/04/2017 FINDINGS: Evaluation of this exam is limited in the absence of intravenous contrast. Evaluation is also limited due to motion artifact. Lower chest: Borderline cardiomegaly. Partially visualized pericardial effusion measuring approximately 9 mm in thickness. Correlation with clinical exam and echocardiogram recommended. The pericardial effusion appears new or increased compared to the CT of 07/04/2017. No intra-abdominal free air.  No free fluid. Hepatobiliary: No focal liver abnormality is seen. No gallstones, gallbladder wall thickening, or biliary dilatation.  Pancreas: Unremarkable. No pancreatic ductal dilatation or surrounding inflammatory changes. Spleen: Normal in size without focal abnormality. Adrenals/Urinary Tract: Adrenal glands are unremarkable. Kidneys are normal, without renal calculi, focal lesion, or hydronephrosis. Bladder is  unremarkable. Stomach/Bowel: There is sigmoid diverticulosis and scattered colonic diverticula without active inflammatory changes. There is no evidence of bowel obstruction or active inflammation. Appendectomy. Vascular/Lymphatic: There is moderate aortoiliac atherosclerotic disease. The abdominal aorta and IVC are otherwise unremarkable on this noncontrast CT. No portal venous gas. There is no adenopathy. Reproductive: Hysterectomy.  No pelvic mass. Other: None Musculoskeletal: Osteopenia with degenerative changes of the spine. No acute osseous pathology. IMPRESSION: 1. No acute intra-abdominopelvic pathology. No bowel obstruction or active inflammation. Colonic diverticulosis. 2. Partially visualized pericardial effusion, new or increased compared to the prior CT. Correlation with echocardiogram recommended. 3.  Aortic Atherosclerosis (ICD10-I70.0). Electronically Signed   By: Anner Crete M.D.   On: 08/04/2017 21:36   Mr Brain Wo Contrast  Result Date: 08/04/2017 CLINICAL DATA:  Syncopal episode.  Abdominal pain. EXAM: MRI HEAD WITHOUT CONTRAST TECHNIQUE: Multiplanar, multiecho pulse sequences of the brain and surrounding structures were obtained without intravenous contrast. COMPARISON:  CT 07/04/2017.  MRI 08/02/2016. FINDINGS: Brain: Diffusion imaging does not show any acute or subacute infarction. There chronic small-vessel ischemic changes of the pons. No focal cerebellar insult. Cerebral hemispheres show confluent chronic small vessel ischemic changes of the deep and subcortical white matter. There are old small vessel changes the thalami. No cortical or large vessel territory infarction. No mass lesion, hemorrhage, hydrocephalus or extra-axial collection. Vascular: Major vessels at the base of the brain show flow. Skull and upper cervical spine: Negative Sinuses/Orbits: Clear/normal Other: None IMPRESSION: No acute finding. Advanced chronic small-vessel ischemic changes of the pons, thalami and  hemispheric white matter. Electronically Signed   By: Nelson Chimes M.D.   On: 08/04/2017 13:39   Mr Lumbar Spine Wo Contrast  Result Date: 08/04/2017 CLINICAL DATA:  Decreased urine output, not ambulating. LEFT leg shingles for 10 days. History of stroke and dementia. EXAM: MRI LUMBAR SPINE WITHOUT CONTRAST TECHNIQUE: Multiplanar, multisequence MR imaging of the lumbar spine was performed. No intravenous contrast was administered. COMPARISON:  CT abdomen and pelvis August 04, 2017 at 2114 hours FINDINGS: SEGMENTATION: For the purposes of this report, the last well-formed intervertebral disc is reported as L5-S1. small S1-2 disc. ALIGNMENT: Maintained lumbar lordosis. No malalignment. VERTEBRAE:Vertebral bodies are intact. Mild L2-3 disc height loss associated with mild acute to subacute discogenic endplate changes. Additional mild subacute discogenic endplate changes T5-5 through L5-S1. Subacute L2 inferior endplate large Schmorl's node. No suspicious bone marrow signal. CONUS MEDULLARIS AND CAUDA EQUINA: Conus medullaris terminates at L1-2 and demonstrates normal morphology and signal characteristics. Cauda equina is normal. PARASPINAL AND OTHER SOFT TISSUES: Included prevertebral and paraspinal soft tissues are normal. DISC LEVELS: L1-2: No disc bulge, canal stenosis nor neural foraminal narrowing. L2-3: Small broad-based disc bulge eccentric laterally. No canal stenosis. Mild RIGHT, minimal LEFT neural foraminal narrowing. L3-4: Small broad-based disc bulge and small LEFT extraforaminal disc protrusion. Mild facet arthropathy and ligamentum flavum redundancy without canal stenosis. Minimal RIGHT, mild-to-moderate LEFT neural foraminal narrowing. L4-5: Small broad-based disc bulge, small RIGHT extraforaminal disc protrusion encroaches upon the exited RIGHT L4 nerve. Mild facet arthropathy and ligamentum flavum redundancy without canal stenosis. Moderate RIGHT, mild to moderate LEFT neural foraminal narrowing.  L5-S1: 3 mm broad-based disc bulge, mild facet arthropathy without canal stenosis. Moderate to severe RIGHT, mild-to-moderate LEFT neural foraminal narrowing. IMPRESSION: 1.  Degenerative change of the lumbar spine without fracture or malalignment. 2. No canal stenosis. 3. Neural foraminal narrowing L2-3 through L5-S1: Moderate to severe on the RIGHT at L5-S1. Electronically Signed   By: Elon Alas M.D.   On: 08/04/2017 23:38    ASSESSMENT AND PLAN:  1 acute moderate to severe hyponatremia Worsening noted Etiology unknown Replete with IV fluids, salt tabs, check BMP in the morning, physical therapy to evaluate/treat  2 acute UTI Resolved  Urinalysis unimpressive Discontinue antibiotics  3 chronic benign essential hypertension Stable on current regiment  4 acute shingles Lesions are dried and crusted 1 more day of valacyclovir to complete course  5 chronic glaucoma Stable on current regiment  POSSIBLE D/C IN 2-5 DAYS, DEPENDING ON CLINICAL CONDITION.   Avel Peace Josean Lycan M.D on 08/05/2017   Between 7am to 6pm - Pager - [phone]  After 6pm go to [email or web address] - password [email or web address] [facility]  Office  [phone]  CC: Primary care physician; Idelle Crouch, MD  Note: This dictation was prepared with Dragon dictation along with smaller phrase technology. Any transcriptional errors that result from this process are unintentional.

## 2017-08-05 NOTE — Evaluation (Signed)
Physical Therapy Evaluation Patient Details Name: Karen Krueger MRN: [id number] DOB: Nov 14, 1931 Today's Date: 08/05/2017   History of Present Illness  84 y/o female here with UTI.  She has dementia and is legally blind.  Still has some open sores on LEs from shingles 2 weeks ago.  Clinical Impression  Pt pleasantly confused t/o the session, her dementia and being blind did make eval difficult.  Daughter present t/o and tended to agree that given her baseline limitations rehab would not likely be beneficial despite the pt currently not being near her baseline.  After discussion about home health daughter felt similarly but as pt was able to follow simple instructions and seemed to participate relatively well she understood that this could be beneficial.  However she ultimately stated that just getting the pt home in her normal environment and in her normal schedule would probably be more beneficial than PT; told her PT would still recommend it if thoughts change, but ultimately she did not seem too interested.  We will at least keep pt on PT list while here in the hospital and try to keep/get her moving as able. Pt did roll side to side well and showed good effort once she understood the requests for mobility, pt is weak (and weaker than baseline) but again did participate with simple cues/commands.    Follow Up Recommendations No PT follow up;Home health PT(discussed with dtr she feels just being home is best, no PT)    Equipment Recommendations       Recommendations for Other Services       Precautions / Restrictions Precautions Precautions: Fall Restrictions Weight Bearing Restrictions: No      Mobility  Bed Mobility Overal bed mobility: Needs Assistance Bed Mobility: Sit to Supine;Supine to Sit     Supine to sit: Min assist Sit to supine: Min assist   General bed mobility comments: Pt showed good effort in trying to get to sitting, maintained balance once assist to  upright  Transfers Overall transfer level: Needs assistance Equipment used: Rolling walker (2 wheeled) Transfers: Sit to/from Stand Sit to Stand: Max assist         General transfer comment: attempted to get to standing, pt unable to fully straighten knees and despite heavy assist from PT needed to sit back down quickly.  Ambulation/Gait                Stairs            Wheelchair Mobility    Modified Rankin (Stroke Patients Only)       Balance Overall balance assessment: Needs assistance   Sitting balance-Leahy Scale: Fair       Standing balance-Leahy Scale: Poor                               Pertinent Vitals/Pain Pain Assessment: No/denies pain    Home Living Family/patient expects to be discharged to:: Private residence Living Arrangements: Children Available Help at Discharge: Family;Available 24 hours/day                  Prior Function Level of Independence: Needs assistance   Gait / Transfers Assistance Needed: Pt can at times walk to bathroom on her own, but between dementia and blindness family always tries to be with her.     Comments: Pt has been unable to do much mobility recently singles, general weakness     Hand Dominance  Extremity/Trunk Assessment   Upper Extremity Assessment Upper Extremity Assessment: Generalized weakness;Difficult to assess due to impaired cognition    Lower Extremity Assessment Lower Extremity Assessment: Generalized weakness;Difficult to assess due to impaired cognition       Communication   Communication: Expressive difficulties  Cognition Arousal/Alertness: Awake/alert Behavior During Therapy: Flat affect;Restless Overall Cognitive Status: History of cognitive impairments - at baseline                                 General Comments: Pt with mitts donned on arrival, while off she was pulling at IV, etc      General Comments      Exercises      Assessment/Plan    PT Assessment Patient needs continued PT services  PT Problem List         PT Treatment Interventions DME instruction;Gait training;Functional mobility training;Therapeutic activities;Therapeutic exercise;Balance training;Patient/family education    PT Goals (Current goals can be found in the Care Plan section)  Acute Rehab PT Goals Patient Stated Goal: go home PT Goal Formulation: With patient Time For Goal Achievement: 08/19/17 Potential to Achieve Goals: Fair    Frequency Min 2X/week   Barriers to discharge        Co-evaluation               AM-PAC PT "6 Clicks" Daily Activity  Outcome Measure Difficulty turning over in bed (including adjusting bedclothes, sheets and blankets)?: A Little Difficulty moving from lying on back to sitting on the side of the bed? : Unable Difficulty sitting down on and standing up from a chair with arms (e.g., wheelchair, bedside commode, etc,.)?: Unable Help needed moving to and from a bed to chair (including a wheelchair)?: Total Help needed walking in hospital room?: Total Help needed climbing 3-5 steps with a railing? : Total 6 Click Score: 8    End of Session   Activity Tolerance: (limited secondary to mental status)     PT Visit Diagnosis: Muscle weakness (generalized) (M62.81);Difficulty in walking, not elsewhere classified (R26.2)    Time: 4010-2725 PT Time Calculation (min) (ACUTE ONLY): 23 min   Charges:   PT Evaluation $PT Eval Low Complexity: 1 Low     PT G Codes:        Kreg Shropshire, DPT 08/05/2017, 5:28 PM

## 2017-08-05 NOTE — ED Notes (Signed)
Report off to [employer] nurse

## 2017-08-05 NOTE — ED Notes (Signed)
Foley cath urine output 400cc

## 2017-08-05 NOTE — ED Notes (Signed)
Pt waiting on admission bed.  

## 2017-08-05 NOTE — Progress Notes (Signed)
Per MD okay for nurse to removed foley catheter.

## 2017-08-06 LAB — BASIC METABOLIC PANEL
Anion gap: 6 (ref 5–15)
BUN: 18 mg/dL (ref 6–20)
CO2: 25 mmol/L (ref 22–32)
CREATININE: 0.47 mg/dL (ref 0.44–1.00)
Calcium: 8.4 mg/dL — ABNORMAL LOW (ref 8.9–10.3)
Chloride: 100 mmol/L — ABNORMAL LOW (ref 101–111)
Glucose, Bld: 111 mg/dL — ABNORMAL HIGH (ref 65–99)
Potassium: 3.9 mmol/L (ref 3.5–5.1)
SODIUM: 131 mmol/L — AB (ref 135–145)

## 2017-08-06 LAB — SODIUM: SODIUM: 131 mmol/L — AB (ref 135–145)

## 2017-08-06 MED ORDER — SODIUM CHLORIDE 1 G PO TABS: 1.0000 g | ORAL_TABLET | Freq: Two times a day (BID) | ORAL | 0 refills | Status: DC

## 2017-08-06 MED ORDER — SODIUM CHLORIDE 0.9 % IV SOLN
INTRAVENOUS | Status: AC
  Administered 2017-08-06: 01:00:00 via INTRAVENOUS

## 2017-08-06 NOTE — Care Management Important Message (Signed)
Important Message  Patient Details  Name: Karen Krueger MRN: [id number] Date of Birth: 1932-06-22   Medicare Important Message Given:  N/A - LOS <3 / Initial given by admissions    Beverly Sessions, RN 08/06/2017, 12:30 PM

## 2017-08-06 NOTE — Discharge Summary (Addendum)
[facility] NAME: Karen Krueger    MR#:  [id number]  DATE OF BIRTH:  1932-03-22  DATE OF ADMISSION:  08/04/2017 ADMITTING PHYSICIAN: Lance Coon, MD  DATE OF DISCHARGE: No discharge date for patient encounter.  PRIMARY CARE PHYSICIAN: Idelle Crouch, MD    ADMISSION DIAGNOSIS:  Hyponatremia [E87.1] Altered mental status, unspecified altered mental status type [R41.82]  DISCHARGE DIAGNOSIS:  Principal Problem:   UTI (urinary tract infection) Active Problems:   HTN (hypertension)   Glaucoma   Dementia due to Alzheimer's disease   Hyponatremia   Shingles outbreak   SECONDARY DIAGNOSIS:   Past Medical History:  Diagnosis Date  . Blind left eye   . Cancer ([place])    previous skin cancer   . Dementia   . Glaucoma    blind in Left eye   . Hypertension    controlled with medication   . Stroke [facility])    two previous strokes, 2005, 2011     HOSPITAL COURSE:   1 acute moderate to severe hyponatremia Resolving-sodium 131 at discharge Etiology unknown Repleted with IV fluids and salt tabs  2 acute UTI Ruled out Urinalysis unimpressive, urine culture negative Discontinue antibiotics  3 chronic benign essential hypertension Stable on current regiment  4 acute shingles Treated with course of valacyclovir Lesions are dried and crusted  5 chronic glaucoma Stable on current regiment  6 chronic moderate to severe vascular type dementia With associated chronic ataxia, blindness, high risk for falls, would benefit from wheelchair for mobility Evaluated by physical therapy while in house And discussion with the patient's daughter-we will arrange for light weight wheelchair to help prevent falls    DISCHARGE CONDITIONS:  On day of discharge patient is afebrile, exam is stable, tolerating diet, ready for discharge home in the care of family, follow-up with primary care provider in 1-2 weeks for  reevaluation, for more specific details please see chart  CONSULTS OBTAINED:    DRUG ALLERGIES:   Allergies  Allergen Reactions  . Codeine Nausea And Vomiting  . Sulfa Antibiotics Other (See Comments)    Unknown   . Penicillins Nausea Only    DISCHARGE MEDICATIONS:   Allergies as of 08/06/2017      Reactions   Codeine Nausea And Vomiting   Sulfa Antibiotics Other (See Comments)   Unknown   Penicillins Nausea Only      Medication List    STOP taking these medications   valACYclovir 1000 MG tablet Commonly known as:  VALTREX     TAKE these medications   latanoprost 0.005 % ophthalmic solution Commonly known as:  XALATAN Place 1 drop into both eyes at bedtime.   naproxen 500 MG tablet Commonly known as:  NAPROSYN Take 1 tablet (500 mg total) by mouth 2 (two) times daily with a meal.   sodium chloride 1 g tablet Take 1 tablet (1 g total) by mouth 2 (two) times daily with a meal.   timolol 0.5 % ophthalmic solution Commonly known as:  TIMOPTIC Place 1 drop into the right eye 2 (two) times daily.   verapamil 180 MG CR tablet Commonly known as:  CALAN-SR Take 180 mg by mouth 2 (two) times daily.            Durable Medical Equipment  (From admission, onward)        Start     Ordered   08/06/17 0910  For home use only DME standard  manual wheelchair with seat cushion  (Wheelchairs)  Once    Comments:  Patient suffers from severe vascular dementia, chronic blindness, ataxic gait, high risk for falls which impairs their ability to perform daily activities like bathing in the home.  A walker will not resolve  issue with performing activities of daily living. A wheelchair will allow patient to safely perform daily activities. Patient can safely propel the wheelchair in the home or has a caregiver who can provide assistance.  Accessories: elevating leg rests (ELRs), wheel locks, extensions and anti-tippers.   08/06/17 0909       DISCHARGE INSTRUCTIONS:     If you experience worsening of your admission symptoms, develop shortness of breath, life threatening emergency, suicidal or homicidal thoughts you must seek medical attention immediately by calling 911 or calling your MD immediately  if symptoms less severe.  You Must read complete instructions/literature along with all the possible adverse reactions/side effects for all the Medicines you take and that have been prescribed to you. Take any new Medicines after you have completely understood and accept all the possible adverse reactions/side effects.   Please note  You were cared for by a hospitalist during your hospital stay. If you have any questions about your discharge medications or the care you received while you were in the hospital after you are discharged, you can call the unit and asked to speak with the hospitalist on call if the hospitalist that took care of you is not available. Once you are discharged, your primary care physician will handle any further medical issues. Please note that NO REFILLS for any discharge medications will be authorized once you are discharged, as it is imperative that you return to your primary care physician (or establish a relationship with a primary care physician if you do not have one) for your aftercare needs so that they can reassess your need for medications and monitor your lab values.    Today   CHIEF COMPLAINT:   Chief Complaint  Patient presents with  . Altered Mental Status    HISTORY OF PRESENT ILLNESS:  Karen Krueger  is a 84 y.o. female who presents with weakness at home.  Patient is unable to contribute to her HPI much due to confusion.  Patient's family states that she developed a shingles outbreak about 10 days ago.  She was seen in the clinic and put on valacyclovir.  Then she was diagnosed with UTI 3 days ago and put on ciprofloxacin.  Since that time her weakness has progressed, especially on her left leg where the shingles  outbreak is.  She came back to the clinic today and had an MRI which was largely within normal limits, with just age-related changes, but no acute pathology.  She was sent here from clinic for further evaluation.  Workup here in the ED shows hyponatremia, without much other acute pathology.  Hospitalist were called for admission.     VITAL SIGNS:  Blood pressure (!) 152/77, pulse 82, temperature 98.5 F (36.9 C), temperature source Oral, resp. rate 16, height 5\' 2"  (1.575 m), weight 46.7 kg (103 lb), SpO2 99 %.  I/O:    Intake/Output Summary (Last 24 hours) at 08/06/2017 0910 Last data filed at 08/06/2017 0856 Gross per 24 hour  Intake 1989 ml  Output 1385 ml  Net 604 ml    PHYSICAL EXAMINATION:  GENERAL:  84 y.o.-year-old patient lying in the bed with no acute distress.  EYES: Pupils equal, round, reactive to light  and accommodation. No scleral icterus. Extraocular muscles intact.  HEENT: Head atraumatic, normocephalic. Oropharynx and nasopharynx clear.  NECK:  Supple, no jugular venous distention. No thyroid enlargement, no tenderness.  LUNGS: Normal breath sounds bilaterally, no wheezing, rales,rhonchi or crepitation. No use of accessory muscles of respiration.  CARDIOVASCULAR: S1, S2 normal. No murmurs, rubs, or gallops.  ABDOMEN: Soft, non-tender, non-distended. Bowel sounds present. No organomegaly or mass.  EXTREMITIES: No pedal edema, cyanosis, or clubbing.  NEUROLOGIC: Cranial nerves II through XII are intact. Muscle strength 5/5 in all extremities. Sensation intact. Gait not checked.  PSYCHIATRIC: The patient is alert and oriented x 3.  SKIN: No obvious rash, lesion, or ulcer.   DATA REVIEW:   CBC Recent Labs  Lab 08/05/17 0218  WBC 8.8  HGB 13.3  HCT 39.1  PLT 344    Chemistries  Recent Labs  Lab 08/04/17 1853  08/06/17 0149  NA 125*   < > 131*  K 4.1   < > 3.9  CL 90*   < > 100*  CO2 26   < > 25  GLUCOSE 117*   < > 111*  BUN 14   < > 18  CREATININE  0.57   < > 0.47  CALCIUM 9.1   < > 8.4*  AST 24  --   --   ALT 17  --   --   ALKPHOS 50  --   --   BILITOT 2.1*  --   --    < > = values in this interval not displayed.    Cardiac Enzymes No results for input(s): TROPONINI in the last 168 hours.  Microbiology Results  Results for orders placed or performed during the hospital encounter of 07/04/17  Gastrointestinal Panel by PCR , Stool     Status: None   Collection Time: 07/04/17  6:46 PM  Result Value Ref Range Status   Campylobacter species NOT DETECTED NOT DETECTED Final   Plesimonas shigelloides NOT DETECTED NOT DETECTED Final   Salmonella species NOT DETECTED NOT DETECTED Final   Yersinia enterocolitica NOT DETECTED NOT DETECTED Final   Vibrio species NOT DETECTED NOT DETECTED Final   Vibrio cholerae NOT DETECTED NOT DETECTED Final   Enteroaggregative E coli (EAEC) NOT DETECTED NOT DETECTED Final   Enteropathogenic E coli (EPEC) NOT DETECTED NOT DETECTED Final   Enterotoxigenic E coli (ETEC) NOT DETECTED NOT DETECTED Final   Shiga like toxin producing E coli (STEC) NOT DETECTED NOT DETECTED Final   Shigella/Enteroinvasive E coli (EIEC) NOT DETECTED NOT DETECTED Final   Cryptosporidium NOT DETECTED NOT DETECTED Final   Cyclospora cayetanensis NOT DETECTED NOT DETECTED Final   Entamoeba histolytica NOT DETECTED NOT DETECTED Final   Giardia lamblia NOT DETECTED NOT DETECTED Final   Adenovirus F40/41 NOT DETECTED NOT DETECTED Final   Astrovirus NOT DETECTED NOT DETECTED Final   Norovirus GI/GII NOT DETECTED NOT DETECTED Final   Rotavirus A NOT DETECTED NOT DETECTED Final   Sapovirus (I, II, IV, and V) NOT DETECTED NOT DETECTED Final  C difficile quick scan w PCR reflex     Status: None   Collection Time: 07/04/17  6:46 PM  Result Value Ref Range Status   C Diff antigen NEGATIVE NEGATIVE Final   C Diff toxin NEGATIVE NEGATIVE Final   C Diff interpretation No C. difficile detected.  Final    RADIOLOGY:  Ct Abdomen Pelvis  Wo Contrast  Result Date: 08/04/2017 CLINICAL DATA:  84 year old female with abdominal distention. EXAM: CT  ABDOMEN AND PELVIS WITHOUT CONTRAST TECHNIQUE: Multidetector CT imaging of the abdomen and pelvis was performed following the standard protocol without IV contrast. COMPARISON:  Abdominal CT dated 07/04/2017 FINDINGS: Evaluation of this exam is limited in the absence of intravenous contrast. Evaluation is also limited due to motion artifact. Lower chest: Borderline cardiomegaly. Partially visualized pericardial effusion measuring approximately 9 mm in thickness. Correlation with clinical exam and echocardiogram recommended. The pericardial effusion appears new or increased compared to the CT of 07/04/2017. No intra-abdominal free air.  No free fluid. Hepatobiliary: No focal liver abnormality is seen. No gallstones, gallbladder wall thickening, or biliary dilatation. Pancreas: Unremarkable. No pancreatic ductal dilatation or surrounding inflammatory changes. Spleen: Normal in size without focal abnormality. Adrenals/Urinary Tract: Adrenal glands are unremarkable. Kidneys are normal, without renal calculi, focal lesion, or hydronephrosis. Bladder is unremarkable. Stomach/Bowel: There is sigmoid diverticulosis and scattered colonic diverticula without active inflammatory changes. There is no evidence of bowel obstruction or active inflammation. Appendectomy. Vascular/Lymphatic: There is moderate aortoiliac atherosclerotic disease. The abdominal aorta and IVC are otherwise unremarkable on this noncontrast CT. No portal venous gas. There is no adenopathy. Reproductive: Hysterectomy.  No pelvic mass. Other: None Musculoskeletal: Osteopenia with degenerative changes of the spine. No acute osseous pathology. IMPRESSION: 1. No acute intra-abdominopelvic pathology. No bowel obstruction or active inflammation. Colonic diverticulosis. 2. Partially visualized pericardial effusion, new or increased compared to the prior  CT. Correlation with echocardiogram recommended. 3.  Aortic Atherosclerosis (ICD10-I70.0). Electronically Signed   By: Anner Crete M.D.   On: 08/04/2017 21:36   Mr Brain Wo Contrast  Result Date: 08/04/2017 CLINICAL DATA:  Syncopal episode.  Abdominal pain. EXAM: MRI HEAD WITHOUT CONTRAST TECHNIQUE: Multiplanar, multiecho pulse sequences of the brain and surrounding structures were obtained without intravenous contrast. COMPARISON:  CT 07/04/2017.  MRI 08/02/2016. FINDINGS: Brain: Diffusion imaging does not show any acute or subacute infarction. There chronic small-vessel ischemic changes of the pons. No focal cerebellar insult. Cerebral hemispheres show confluent chronic small vessel ischemic changes of the deep and subcortical white matter. There are old small vessel changes the thalami. No cortical or large vessel territory infarction. No mass lesion, hemorrhage, hydrocephalus or extra-axial collection. Vascular: Major vessels at the base of the brain show flow. Skull and upper cervical spine: Negative Sinuses/Orbits: Clear/normal Other: None IMPRESSION: No acute finding. Advanced chronic small-vessel ischemic changes of the pons, thalami and hemispheric white matter. Electronically Signed   By: Nelson Chimes M.D.   On: 08/04/2017 13:39   Mr Lumbar Spine Wo Contrast  Result Date: 08/04/2017 CLINICAL DATA:  Decreased urine output, not ambulating. LEFT leg shingles for 10 days. History of stroke and dementia. EXAM: MRI LUMBAR SPINE WITHOUT CONTRAST TECHNIQUE: Multiplanar, multisequence MR imaging of the lumbar spine was performed. No intravenous contrast was administered. COMPARISON:  CT abdomen and pelvis August 04, 2017 at 2114 hours FINDINGS: SEGMENTATION: For the purposes of this report, the last well-formed intervertebral disc is reported as L5-S1. small S1-2 disc. ALIGNMENT: Maintained lumbar lordosis. No malalignment. VERTEBRAE:Vertebral bodies are intact. Mild L2-3 disc height loss associated  with mild acute to subacute discogenic endplate changes. Additional mild subacute discogenic endplate changes Z6-1 through L5-S1. Subacute L2 inferior endplate large Schmorl's node. No suspicious bone marrow signal. CONUS MEDULLARIS AND CAUDA EQUINA: Conus medullaris terminates at L1-2 and demonstrates normal morphology and signal characteristics. Cauda equina is normal. PARASPINAL AND OTHER SOFT TISSUES: Included prevertebral and paraspinal soft tissues are normal. DISC LEVELS: L1-2: No disc bulge, canal stenosis nor neural  foraminal narrowing. L2-3: Small broad-based disc bulge eccentric laterally. No canal stenosis. Mild RIGHT, minimal LEFT neural foraminal narrowing. L3-4: Small broad-based disc bulge and small LEFT extraforaminal disc protrusion. Mild facet arthropathy and ligamentum flavum redundancy without canal stenosis. Minimal RIGHT, mild-to-moderate LEFT neural foraminal narrowing. L4-5: Small broad-based disc bulge, small RIGHT extraforaminal disc protrusion encroaches upon the exited RIGHT L4 nerve. Mild facet arthropathy and ligamentum flavum redundancy without canal stenosis. Moderate RIGHT, mild to moderate LEFT neural foraminal narrowing. L5-S1: 3 mm broad-based disc bulge, mild facet arthropathy without canal stenosis. Moderate to severe RIGHT, mild-to-moderate LEFT neural foraminal narrowing. IMPRESSION: 1. Degenerative change of the lumbar spine without fracture or malalignment. 2. No canal stenosis. 3. Neural foraminal narrowing L2-3 through L5-S1: Moderate to severe on the RIGHT at L5-S1. Electronically Signed   By: Elon Alas M.D.   On: 08/04/2017 23:38    EKG:   Orders placed or performed during the hospital encounter of 07/04/17  . ED EKG  . ED EKG  . EKG 12-Lead  . EKG 12-Lead      Management plans discussed with the patient, family and they are in agreement.  CODE STATUS:     Code Status Orders  (From admission, onward)        Start     Ordered   08/05/17  0151  Do not attempt resuscitation (DNR)  Continuous    Question Answer Comment  In the event of cardiac or respiratory ARREST Do not call a "code blue"   In the event of cardiac or respiratory ARREST Do not perform Intubation, CPR, defibrillation or ACLS   In the event of cardiac or respiratory ARREST Use medication by any route, position, wound care, and other measures to relive pain and suffering. May use oxygen, suction and manual treatment of airway obstruction as needed for comfort.   Comments RN may pronounce      08/05/17 0151    Code Status History    Date Active Date Inactive Code Status Order ID Comments User Context   01/05/2017 17:09 01/06/2017 19:52 DNR [id number]  Nicholes Mango, MD Inpatient   08/02/2016 21:57 08/03/2016 20:56 Full Code [id number]  Lance Coon, MD ED      TOTAL TIME TAKING CARE OF THIS PATIENT: 45 minutes.    Avel Peace Cree Kunert M.D on 08/06/2017 at 9:10 AM  Between 7am to 6pm - Pager - [phone]  After 6pm go to [email or web address] - password [email or web address] [facility]  Office  [phone]  CC: Primary care physician; Idelle Crouch, MD   Note: This dictation was prepared with Dragon dictation along with smaller phrase technology. Any transcriptional errors that result from this process are unintentional.

## 2017-08-06 NOTE — Care Management (Signed)
Patient admitted with UTI.  Patient to discharge home today.  Lives with daughter [name].  History obtained from daughter. PCP [facility].  PT has recommend home health however [name] declines services at discharge.  She states "she has had them before ad it didn't benefit her.  She is not going to be able to progress with her dementia and how she walks".  Patient has a RW and BSC in the home.  WC delivered to room by Corene Cornea with [facility].  [name] informed that should she change her mind about home health services in the home her PCP can order.  RNCM signing off.

## 2017-10-07 IMAGING — CR DG LUMBAR SPINE 2-3V
3 series · 3 of 3 positions shown · non-contrast
Comparison: None.

CLINICAL DATA: Pain following fall

EXAM:
LUMBAR SPINE - 2-3 VIEW

[l-spine ap]
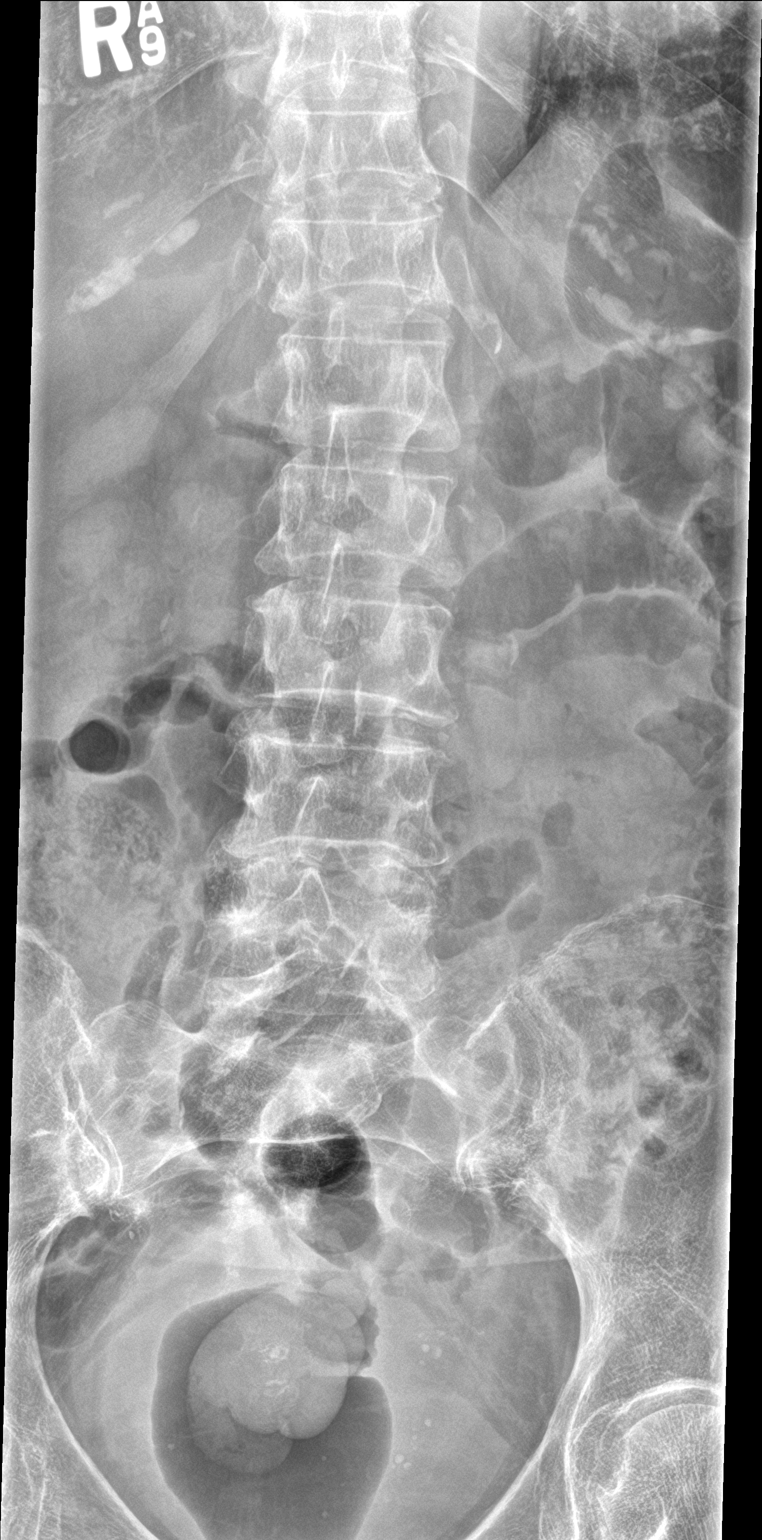

[l-spine lat]
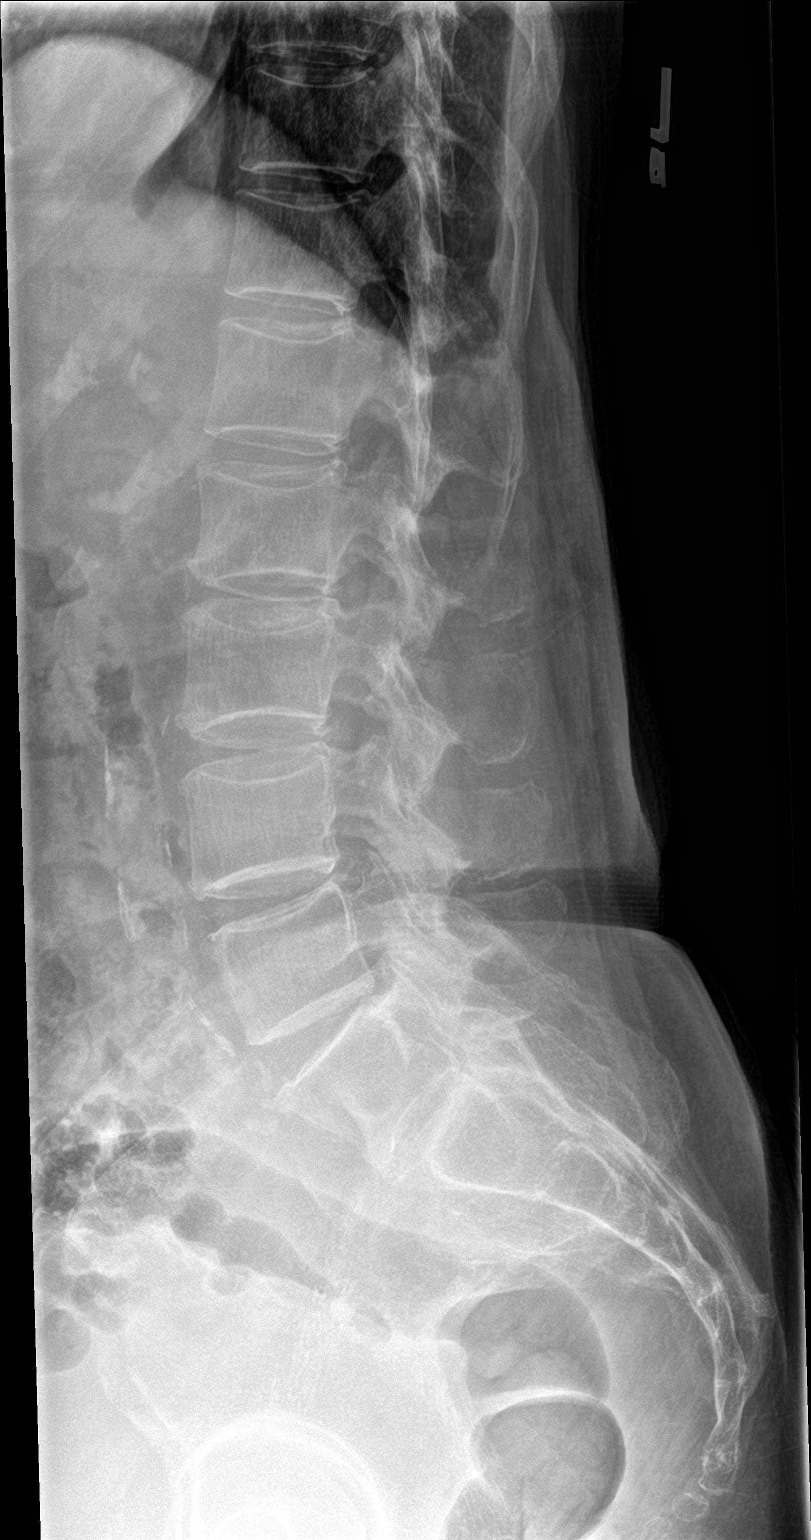

[l-spine spot]
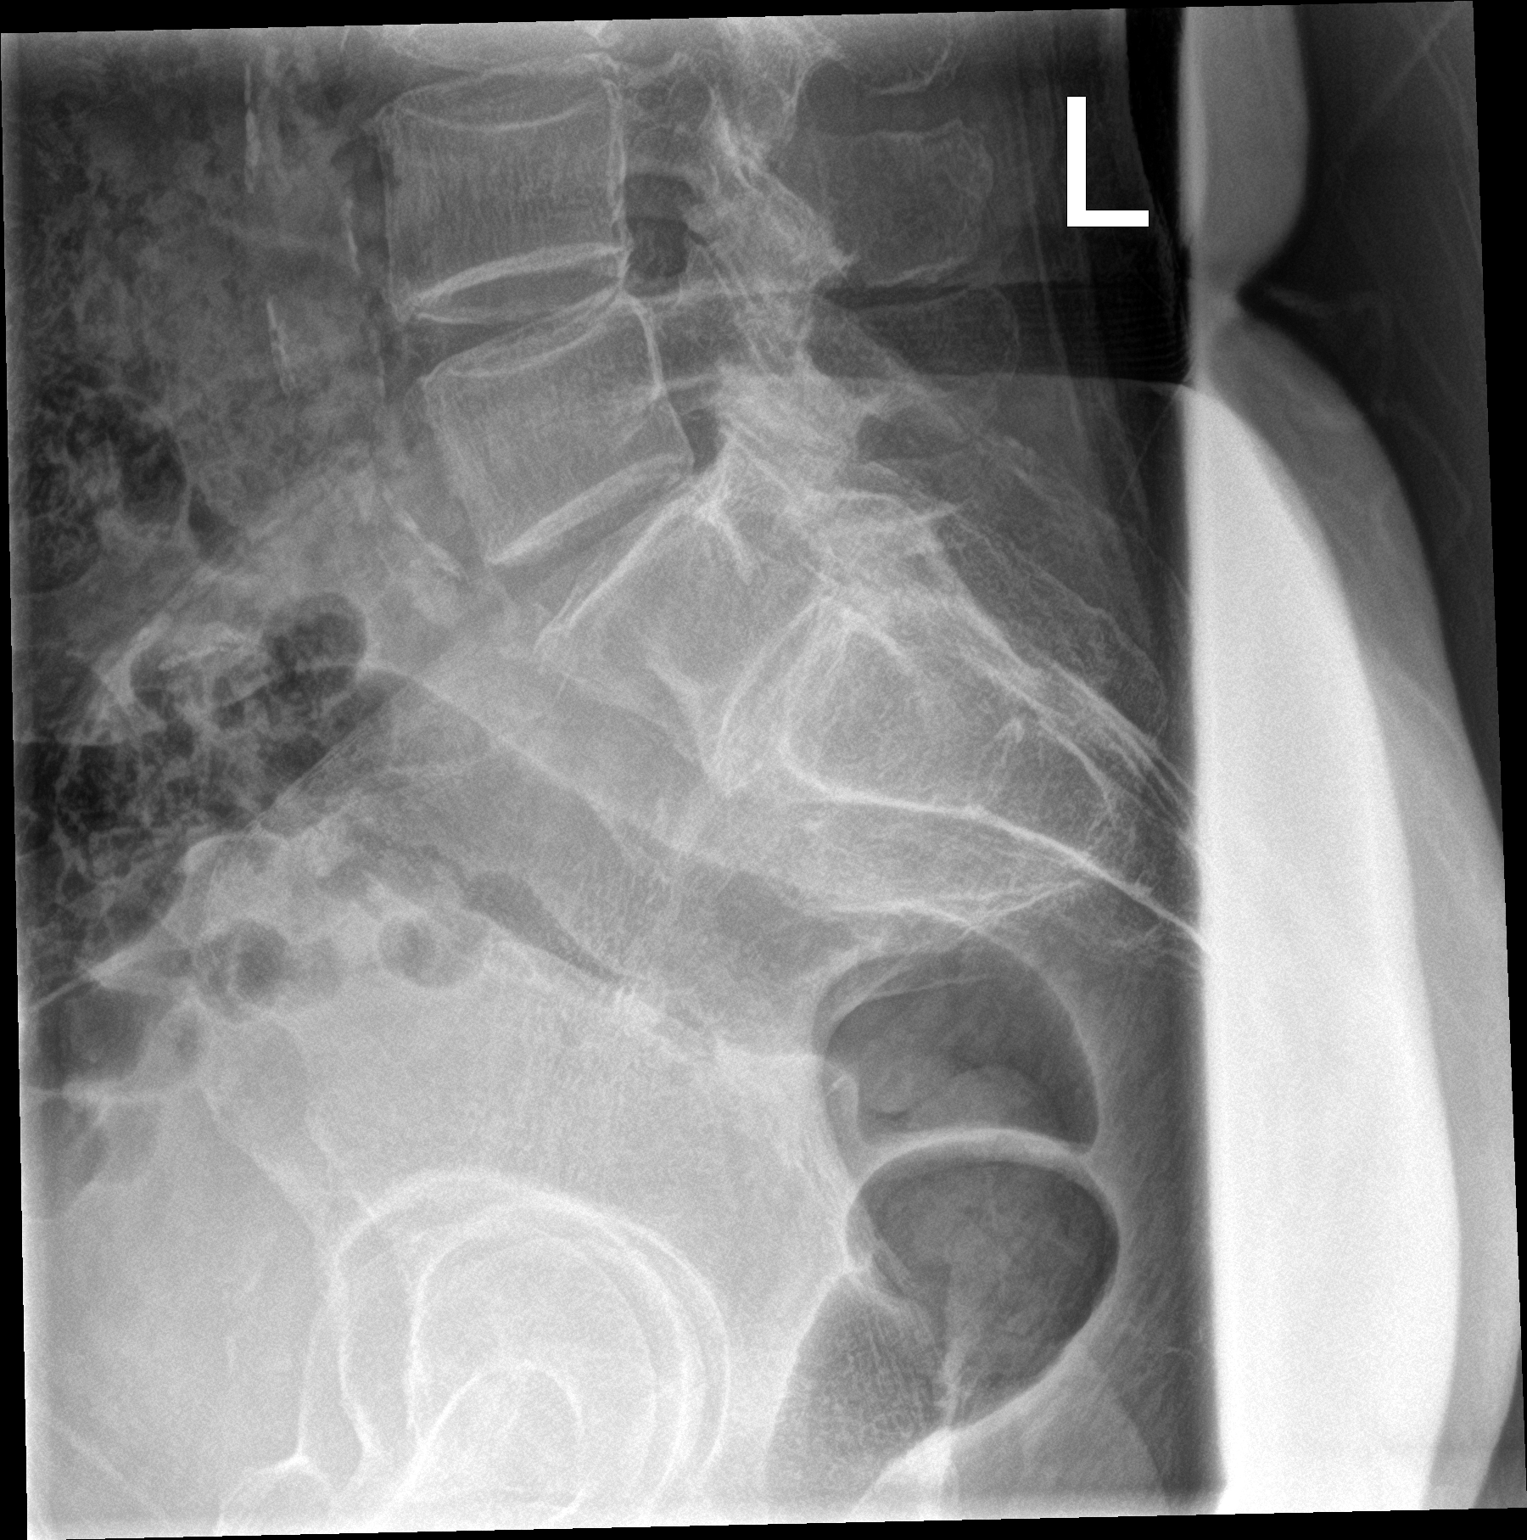

[3 of 3 positions shown; findings below may reference images not displayed]

FINDINGS: Frontal, lateral, and spot lumbosacral lateral images were obtained.
There are 5 non-rib-bearing lumbar type vertebral bodies. There is
mild levoscoliosis. There is no fracture or spondylolisthesis. There
is mild disc space narrowing at all levels. There is extensive
calcification in the aorta and iliac arteries.
IMPRESSION: Mild multilevel arthropathy. Mild scoliosis. No fracture or
spondylolisthesis. There is aortoiliac atherosclerosis.

## 2017-10-07 IMAGING — CR DG PELVIS 1-2V
2 series · 2 of 2 positions shown · non-contrast
Comparison: None.

CLINICAL DATA: Pain following fall

EXAM:
PELVIS - 1-2 VIEW

[pelvis ap (1 of 2)]
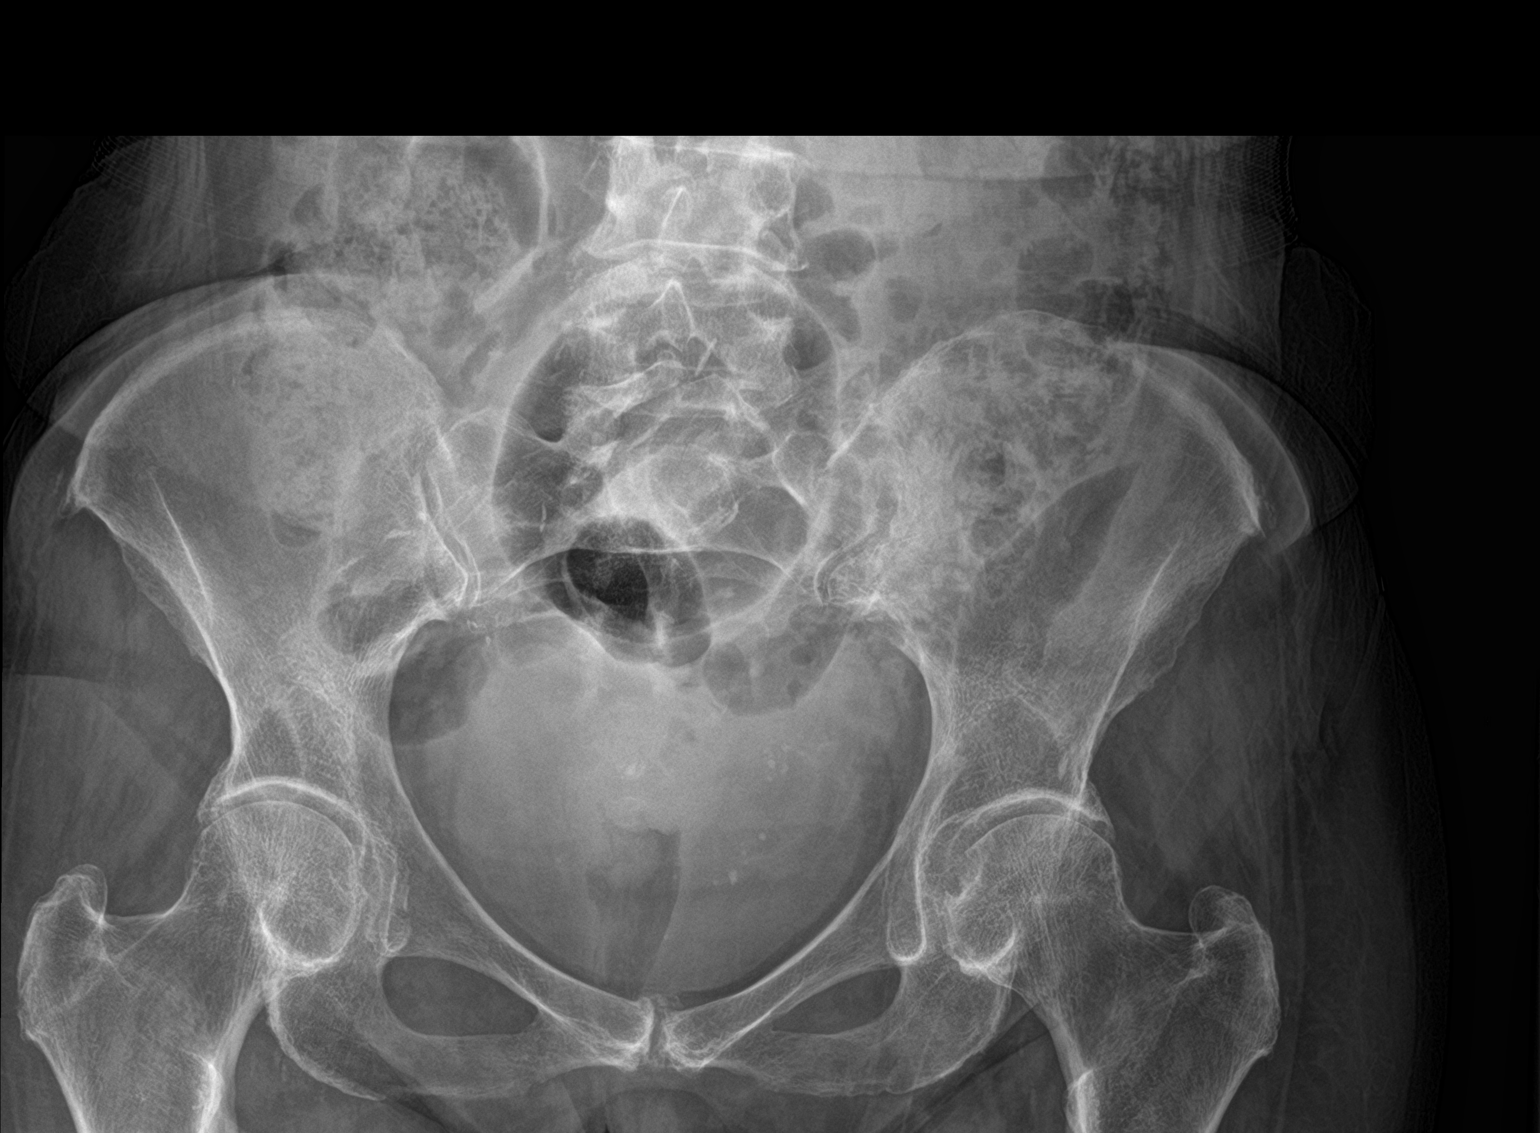

[pelvis ap (2 of 2)]
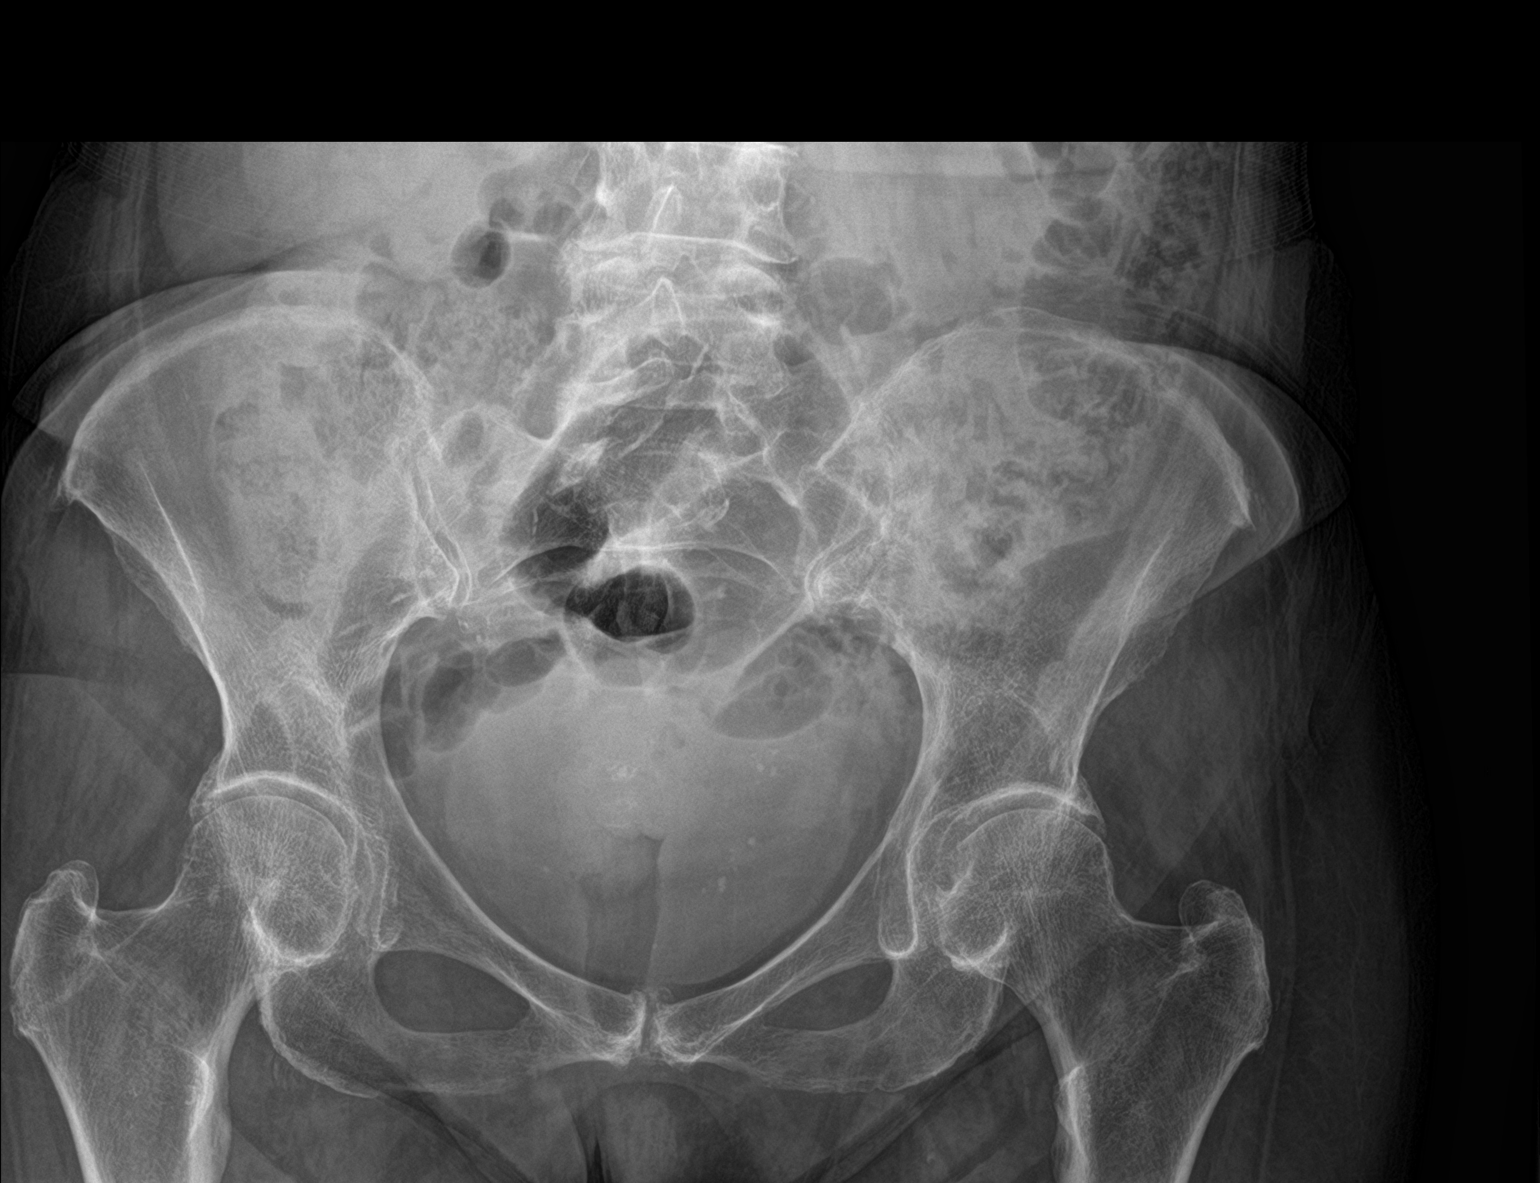

[2 of 2 positions shown; findings below may reference images not displayed]

FINDINGS: There is no evidence of pelvic fracture or dislocation. There is
moderate symmetric narrowing of both hip joints. There is also
osteoarthritic change in the pubic symphysis. No erosive change.
There is iliac artery calcification bilaterally.
IMPRESSION: Areas of osteoarthritic change. No fracture or dislocation. There is
iliac artery atherosclerosis.

## 2017-10-07 IMAGING — CR DG KNEE COMPLETE 4+V*R*
4 series · 4 of 4 positions shown · non-contrast
Comparison: Right knee MRI October 01, 2005

CLINICAL DATA: Pain following fall

EXAM:
RIGHT KNEE - COMPLETE 4+ VIEW

[knee ap]
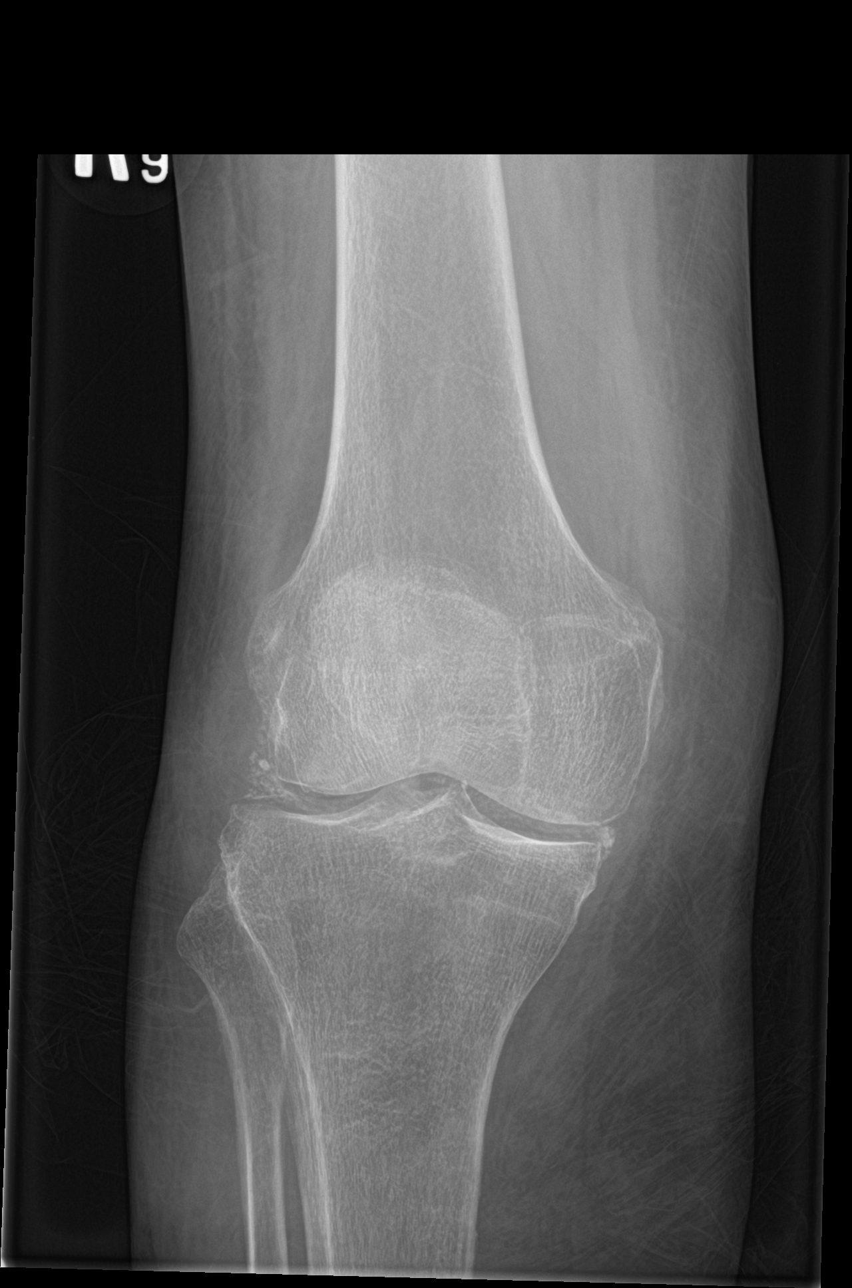

[knee obl (1 of 2)]
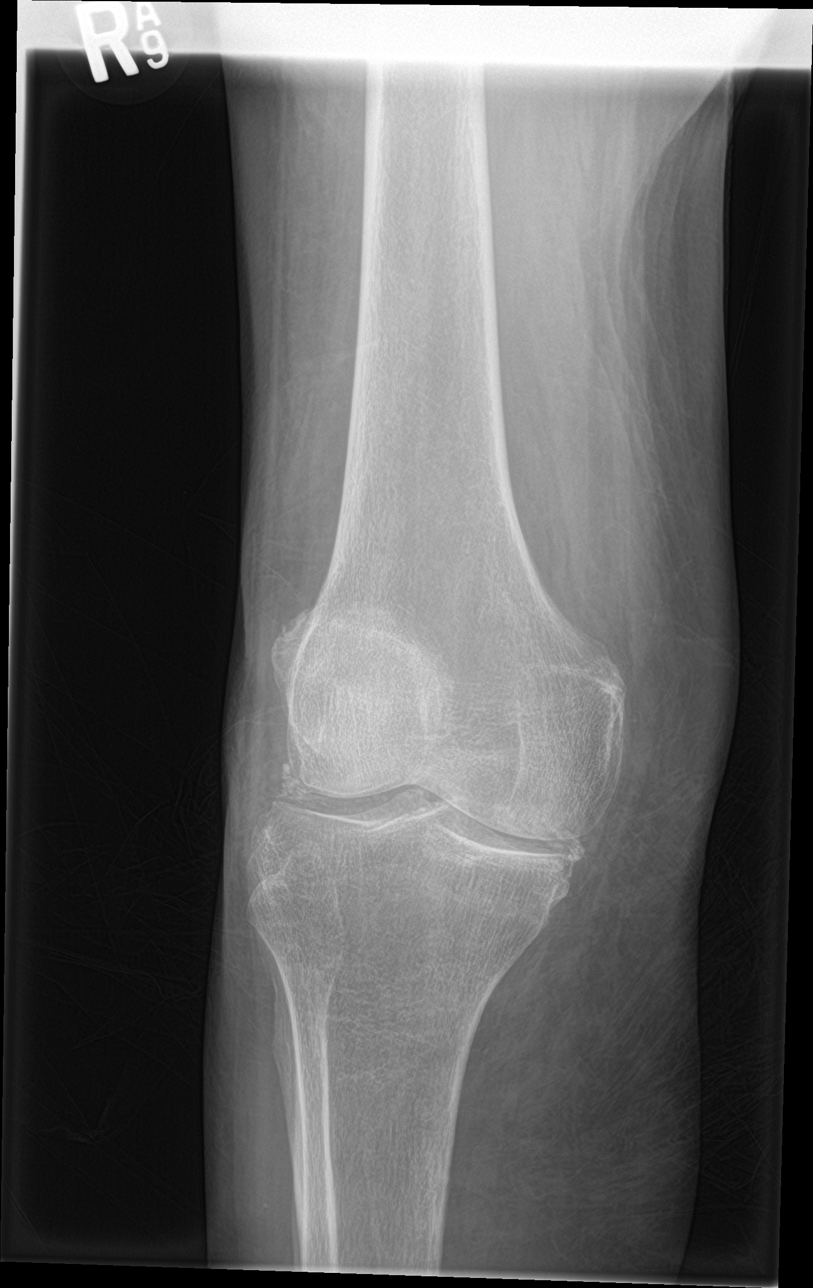

[knee obl (2 of 2)]
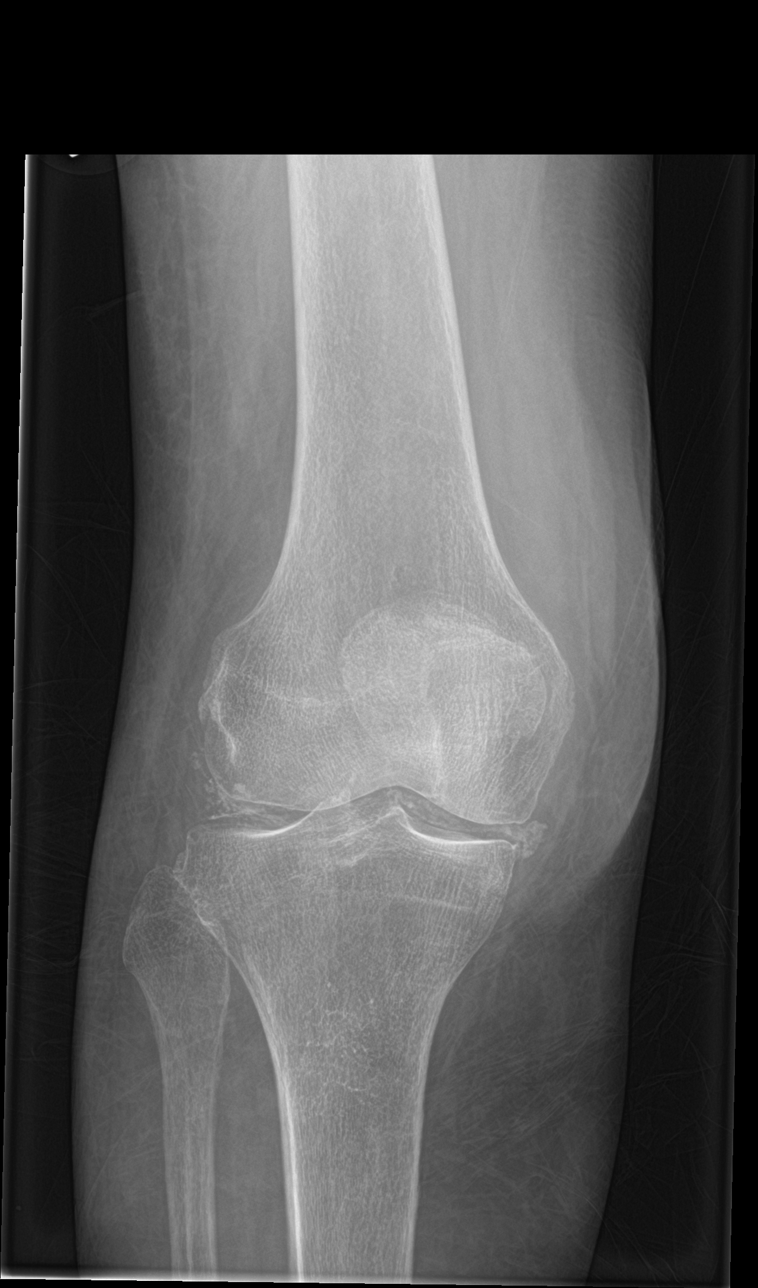

[knee lat]
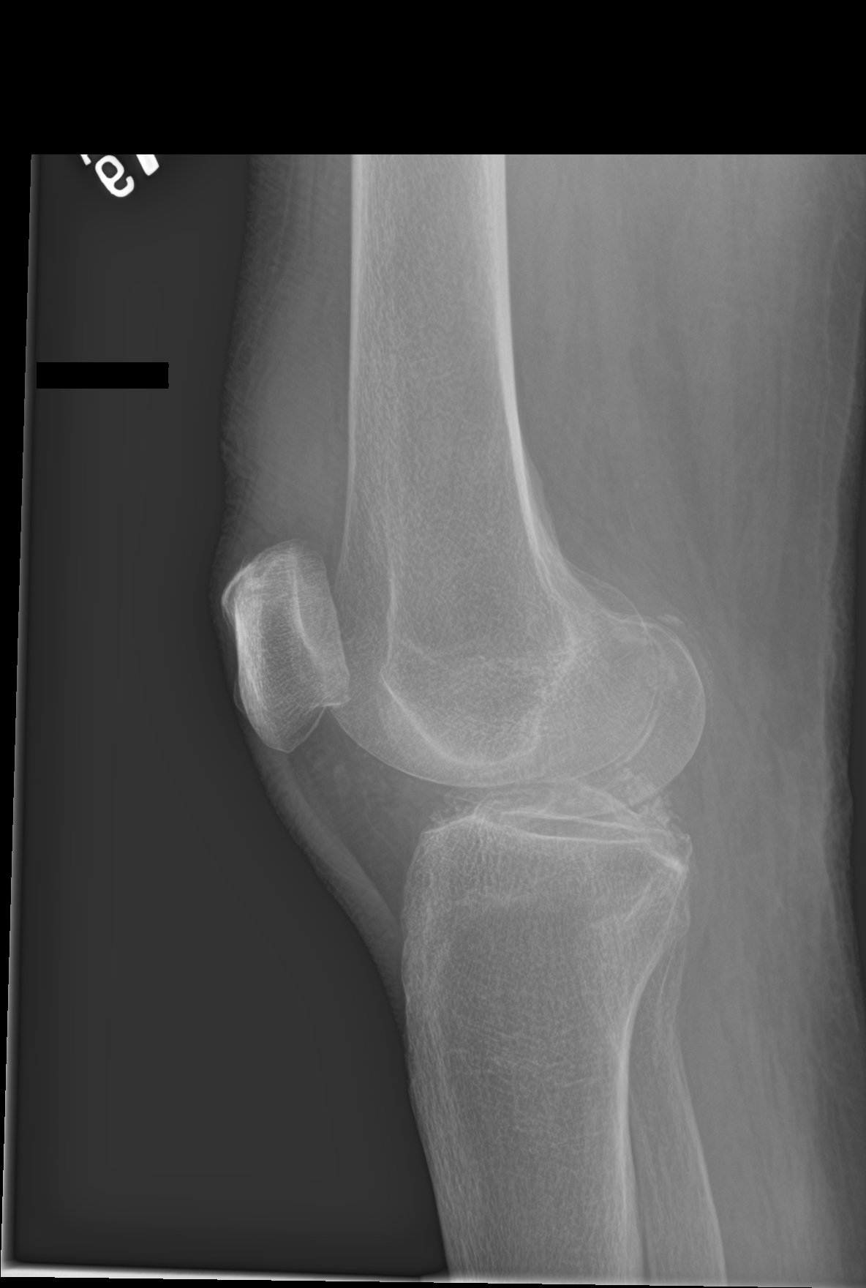

[4 of 4 positions shown; findings below may reference images not displayed]

FINDINGS: Frontal, lateral, and bilateral oblique views were obtained. There
is no acute fracture or dislocation. There is a moderate joint
effusion. There is soft tissue swelling.

There is moderate narrowing medially. There is extensive
chondrocalcinosis. No erosive change.
IMPRESSION: There is a moderate joint effusion with soft tissue swelling. No
fracture or dislocation.

There is narrowing medially. Extensive chondrocalcinosis may be seen
with osteoarthritis or with calcium pyrophosphate deposition
disease.

## 2017-10-17 DIAGNOSIS — C4401 Basal cell carcinoma of skin of lip: Secondary | ICD-10-CM

## 2017-10-17 HISTORY — DX: Basal cell carcinoma of skin of lip: C44.01

## 2019-03-06 ENCOUNTER — Other Ambulatory Visit: Payer: Self-pay

## 2019-03-06 ENCOUNTER — Emergency Department: Payer: Medicare Other

## 2019-03-06 ENCOUNTER — Emergency Department
Admission: EM | Admit: 2019-03-06 | Discharge: 2019-03-06 | Disposition: A | Discharge status: Home / Self Care | Payer: Medicare Other | Attending: Emergency Medicine | Admitting: Emergency Medicine

## 2019-03-06 DIAGNOSIS — I10 Essential (primary) hypertension: Secondary | ICD-10-CM | POA: Diagnosis not present

## 2019-03-06 DIAGNOSIS — R531 Weakness: Secondary | ICD-10-CM | POA: Diagnosis not present

## 2019-03-06 DIAGNOSIS — Z882 Allergy status to sulfonamides status: Secondary | ICD-10-CM | POA: Insufficient documentation

## 2019-03-06 DIAGNOSIS — G309 Alzheimer's disease, unspecified: Secondary | ICD-10-CM | POA: Diagnosis not present

## 2019-03-06 DIAGNOSIS — M542 Cervicalgia: Secondary | ICD-10-CM | POA: Diagnosis present

## 2019-03-06 DIAGNOSIS — Z8673 Personal history of transient ischemic attack (TIA), and cerebral infarction without residual deficits: Secondary | ICD-10-CM | POA: Diagnosis not present

## 2019-03-06 DIAGNOSIS — Z880 Allergy status to penicillin: Secondary | ICD-10-CM | POA: Diagnosis not present

## 2019-03-06 DIAGNOSIS — Z87891 Personal history of nicotine dependence: Secondary | ICD-10-CM | POA: Insufficient documentation

## 2019-03-06 DIAGNOSIS — Z79899 Other long term (current) drug therapy: Secondary | ICD-10-CM | POA: Diagnosis not present

## 2019-03-06 DIAGNOSIS — M7918 Myalgia, other site: Secondary | ICD-10-CM | POA: Diagnosis not present

## 2019-03-06 DIAGNOSIS — F0280 Dementia in other diseases classified elsewhere without behavioral disturbance: Secondary | ICD-10-CM | POA: Insufficient documentation

## 2019-03-06 DIAGNOSIS — Z885 Allergy status to narcotic agent status: Secondary | ICD-10-CM | POA: Diagnosis not present

## 2019-03-06 LAB — CBC WITH DIFFERENTIAL/PLATELET
Abs Immature Granulocytes: 0.05 10*3/uL (ref 0.00–0.07)
Basophils Absolute: 0 10*3/uL (ref 0.0–0.1)
Basophils Relative: 0 %
Eosinophils Absolute: 0 10*3/uL (ref 0.0–0.5)
Eosinophils Relative: 0 %
HCT: 43.1 % (ref 36.0–46.0)
Hemoglobin: 14.2 g/dL (ref 12.0–15.0)
Immature Granulocytes: 1 %
Lymphocytes Relative: 15 %
Lymphs Abs: 1.2 10*3/uL (ref 0.7–4.0)
MCH: 30 pg (ref 26.0–34.0)
MCHC: 32.9 g/dL (ref 30.0–36.0)
MCV: 91.1 fL (ref 80.0–100.0)
Monocytes Absolute: 1.6 10*3/uL — ABNORMAL HIGH (ref 0.1–1.0)
Monocytes Relative: 20 %
Neutro Abs: 5 10*3/uL (ref 1.7–7.7)
Neutrophils Relative %: 64 %
Platelets: 223 10*3/uL (ref 150–400)
RBC: 4.73 MIL/uL (ref 3.87–5.11)
RDW: 14 % (ref 11.5–15.5)
WBC: 7.9 10*3/uL (ref 4.0–10.5)
nRBC: 0 % (ref 0.0–0.2)

## 2019-03-06 LAB — BASIC METABOLIC PANEL
Anion gap: 9 (ref 5–15)
BUN: 18 mg/dL (ref 8–23)
CO2: 26 mmol/L (ref 22–32)
Calcium: 8.3 mg/dL — ABNORMAL LOW (ref 8.9–10.3)
Chloride: 101 mmol/L (ref 98–111)
Creatinine, Ser: 0.34 mg/dL — ABNORMAL LOW (ref 0.44–1.00)
GFR calc Af Amer: >60 mL/min (ref >60)
GFR calc non Af Amer: >60 mL/min (ref >60)
Glucose, Bld: 119 mg/dL — ABNORMAL HIGH (ref 70–99)
Potassium: 2.9 mmol/L — ABNORMAL LOW (ref 3.5–5.1)
Sodium: 136 mmol/L (ref 135–145)

## 2019-03-06 NOTE — ED Notes (Signed)
Pt moves head side to side; full movement to right; pt stops head short of full movement when turning to left d/t pain per pt; pt states pain in lower back; pt unsure whether she fell or had any other mechanism of injury at home. Pt denies numbness/tingling in hands/feet. Pt has full sensation in all extremities.

## 2019-03-06 NOTE — ED Provider Notes (Signed)
[facility] Emergency Department Provider Note   ____________________________________________   I have reviewed the triage vital signs and the nursing notes.   HISTORY  Chief Complaint Back Pain and Neck Pain   History limited by and level 5 caveat due to dementia.   HPI Karen Krueger is a 85 y.o. female who presents to the emergency department today accompanied by family because of concerns for pain.  Cording to family member the patient does have some difficulty with ambulation.  She normally needs help transferring to wheelchairs.  2 days ago while transferring family felt a pop.  Since then the patient has not been wanting to use her left arm or to stand.  The patient herself has dementia cannot give a great history.  Family states they have also noticed some distention in the stomach.  Patient has not had any fevers or cough the family is appreciated.  Records reviewed. Per medical record review patient has a history of HTN, dementia  Past Medical History:  Diagnosis Date  . Blind left eye   . Cancer ([place])    previous skin cancer   . Dementia ([place])   . Glaucoma    blind in Left eye   . Hypertension    controlled with medication   . Stroke [facility])    two previous strokes, 2005, 2011     Patient Active Problem List   Diagnosis Date Noted  . Hyponatremia 08/04/2017  . UTI (urinary tract infection) 08/04/2017  . Shingles outbreak 08/04/2017  . Altered mental status 01/05/2017  . Dementia due to Alzheimer's disease ([place]) 08/03/2016  . Fall 08/02/2016  . Acute encephalopathy 08/02/2016  . HTN (hypertension) 08/02/2016  . Glaucoma 08/02/2016  . Elevated troponin 08/02/2016    Past Surgical History:  Procedure Laterality Date  . ABDOMINAL HYSTERECTOMY    . APPENDECTOMY    . AQUEOUS SHUNT Right 05/06/2016   Procedure: AQUEOUS SHUNT;  Surgeon: Ronnell Freshwater, MD;  Location: [place];  Service: Ophthalmology;  Laterality:  Right;  SCLERAL PATCH GRAFT AHMED FP7  . EYE SURGERY     glucacoma numerous times     Prior to Admission medications   Medication Sig Start Date End Date Taking? Authorizing Provider  latanoprost (XALATAN) 0.005 % ophthalmic solution Place 1 drop into both eyes at bedtime.    [provider]  naproxen (NAPROSYN) 500 MG tablet Take 1 tablet (500 mg total) by mouth 2 (two) times daily with a meal. 08/12/16   Lavonia Drafts, MD  sodium chloride 1 g tablet Take 1 tablet (1 g total) by mouth 2 (two) times daily with a meal. 08/06/17   Salary, Holly Bodily D, MD  timolol (TIMOPTIC) 0.5 % ophthalmic solution Place 1 drop into the right eye 2 (two) times daily.    [provider]  verapamil (CALAN-SR) 180 MG CR tablet Take 180 mg by mouth 2 (two) times daily.    [provider]    Allergies Codeine, Sulfa antibiotics, and Penicillins  Family History  Problem Relation Age of Onset  . Heart disease Father   . Stroke Daughter     Social History Social History   Tobacco Use  . Smoking status: Former Research scientist (life sciences)  . Smokeless tobacco: Never Used  . Tobacco comment: quit over 45 yrs ago  Substance Use Topics  . Alcohol use: No  . Drug use: No    Review of Systems Unable to obtain reliable ROS secondary to dementia.  ____________________________________________  PHYSICAL EXAM:  VITAL SIGNS: ED Triage Vitals  Enc Vitals Group     BP 03/06/19 1803 140/77     Pulse Rate 03/06/19 1803 94     Resp 03/06/19 1803 18     Temp 03/06/19 1803 99.4 F (37.4 C)     Temp Source 03/06/19 1803 Oral     SpO2 03/06/19 1803 94 %     Weight 03/06/19 1804 110 lb (49.9 kg)     Height 03/06/19 1804 5\' 1"  (1.549 m)     Head Circumference --      Peak Flow --      Pain Score 03/06/19 1804 6   Constitutional: Awake and alert.  Eyes: Conjunctivae are normal.  ENT      Head: Normocephalic and atraumatic.      Nose: No congestion/rhinnorhea.      Mouth/Throat: Mucous membranes are  moist.      Neck: No stridor. Hematological/Lymphatic/Immunilogical: No cervical lymphadenopathy. Cardiovascular: Normal rate, regular rhythm.   Respiratory: Normal respiratory effort without tachypnea nor retractions. Breath sounds are clear and equal bilaterally. No wheezes/rales/rhonchi. Gastrointestinal: Soft and non tender. No rebound. No guarding.  Genitourinary: Deferred Musculoskeletal: No tenderness with deep palpation of extremities. When ranging left shoulder some tenderness elicited. Left leg held in slight external rotation. Neurologic:  Dementia Skin:  Skin is warm, dry and intact. No rash noted. ____________________________________________    LABS (pertinent positives/negatives)  Pending  ____________________________________________   EKG  None  ____________________________________________    RADIOLOGY  CT cervical spine No acute fracture or dislocation  Lumbar spine No fracture  Left hip No fracture, exam slightly limited due to osteoarthritis  Left shoulder No fracture or dislocation  Abd x-ray Non obstructive bowel gas pattern  ____________________________________________   PROCEDURES  Procedures  ____________________________________________   INITIAL IMPRESSION / ASSESSMENT AND PLAN / ED COURSE  Pertinent labs & imaging results that were available during my care of the patient were reviewed by me and considered in my medical decision making (see chart for details).   Patient with history of dementia is brought in by family because of concerns for pain and decreased mobility.  On exam patient with some tenderness to manipulation of that left shoulder although no tenderness with palpation of any of the extremities.  No obvious deformity.  Multiple x-rays were obtained to evaluate for possible fracture.  Will check blood work.   ____________________________________________   FINAL CLINICAL IMPRESSION(S) / ED DIAGNOSES  Musculoskeletal  pain  Note: This dictation was prepared with Dragon dictation. Any transcriptional errors that result from this process are unintentional     Nance Pear, MD 03/06/19 2049

## 2019-03-06 NOTE — ED Provider Notes (Signed)
X-rays have not revealed any acute fractures.  Family is comfortable taking her home.  She is cleared for outpatient follow-up.   Earleen Newport, MD 03/06/19 2134

## 2019-03-06 NOTE — ED Notes (Signed)
Spoke with EDP Archie Balboa about pt presentation, orders received.

## 2019-03-06 NOTE — ED Notes (Signed)
Pt away at imaging per family at bedside.

## 2019-03-06 NOTE — ED Notes (Signed)
Attempted 22g IV at R ac.

## 2019-03-06 NOTE — ED Triage Notes (Signed)
Pt arrives to ED with daughter who states pt is blind and does not stand. Daughter states that today they heard a pop in pt's back when transferring the pt. Pt states pain when touched. Daughter states that today pt kind of buckled and went into deadweight and heard a pop in back. C/o arm pain as well as neck and back pain.

## 2019-03-06 NOTE — ED Notes (Signed)
Pt leaving for imaging. Will collect labs once back.

## 2019-03-06 NOTE — ED Notes (Signed)
Family states no major fall at home but that pt "likes to slip out of bed." L leg bent and leaning to side. Pt states pain at L hip area when pressed on.

## 2020-01-16 ENCOUNTER — Emergency Department: Payer: Medicare Other

## 2020-01-16 ENCOUNTER — Other Ambulatory Visit: Payer: Self-pay

## 2020-01-16 DIAGNOSIS — Z885 Allergy status to narcotic agent status: Secondary | ICD-10-CM | POA: Insufficient documentation

## 2020-01-16 DIAGNOSIS — R4182 Altered mental status, unspecified: Secondary | ICD-10-CM | POA: Insufficient documentation

## 2020-01-16 DIAGNOSIS — I6381 Other cerebral infarction due to occlusion or stenosis of small artery: Principal | ICD-10-CM | POA: Insufficient documentation

## 2020-01-16 DIAGNOSIS — Z79899 Other long term (current) drug therapy: Secondary | ICD-10-CM | POA: Insufficient documentation

## 2020-01-16 DIAGNOSIS — Z87891 Personal history of nicotine dependence: Secondary | ICD-10-CM | POA: Insufficient documentation

## 2020-01-16 DIAGNOSIS — Z66 Do not resuscitate: Secondary | ICD-10-CM | POA: Insufficient documentation

## 2020-01-16 DIAGNOSIS — E876 Hypokalemia: Secondary | ICD-10-CM | POA: Diagnosis not present

## 2020-01-16 DIAGNOSIS — Z8673 Personal history of transient ischemic attack (TIA), and cerebral infarction without residual deficits: Secondary | ICD-10-CM | POA: Insufficient documentation

## 2020-01-16 DIAGNOSIS — H409 Unspecified glaucoma: Secondary | ICD-10-CM | POA: Diagnosis not present

## 2020-01-16 DIAGNOSIS — H547 Unspecified visual loss: Secondary | ICD-10-CM | POA: Insufficient documentation

## 2020-01-16 DIAGNOSIS — R262 Difficulty in walking, not elsewhere classified: Secondary | ICD-10-CM | POA: Diagnosis not present

## 2020-01-16 DIAGNOSIS — R4781 Slurred speech: Secondary | ICD-10-CM | POA: Insufficient documentation

## 2020-01-16 DIAGNOSIS — M6281 Muscle weakness (generalized): Secondary | ICD-10-CM | POA: Diagnosis not present

## 2020-01-16 DIAGNOSIS — G309 Alzheimer's disease, unspecified: Secondary | ICD-10-CM | POA: Insufficient documentation

## 2020-01-16 DIAGNOSIS — Z882 Allergy status to sulfonamides status: Secondary | ICD-10-CM | POA: Insufficient documentation

## 2020-01-16 DIAGNOSIS — Z880 Allergy status to penicillin: Secondary | ICD-10-CM | POA: Insufficient documentation

## 2020-01-16 DIAGNOSIS — N390 Urinary tract infection, site not specified: Secondary | ICD-10-CM | POA: Diagnosis not present

## 2020-01-16 DIAGNOSIS — Z20822 Contact with and (suspected) exposure to covid-19: Secondary | ICD-10-CM | POA: Insufficient documentation

## 2020-01-16 DIAGNOSIS — F0280 Dementia in other diseases classified elsewhere without behavioral disturbance: Secondary | ICD-10-CM | POA: Diagnosis not present

## 2020-01-16 DIAGNOSIS — I10 Essential (primary) hypertension: Secondary | ICD-10-CM | POA: Insufficient documentation

## 2020-01-16 LAB — COMPREHENSIVE METABOLIC PANEL
ALT: 15 U/L (ref 0–44)
AST: 23 U/L (ref 15–41)
Albumin: 3.8 g/dL (ref 3.5–5.0)
Alkaline Phosphatase: 35 U/L — ABNORMAL LOW (ref 38–126)
Anion gap: 9 (ref 5–15)
BUN: 21 mg/dL (ref 8–23)
CO2: 30 mmol/L (ref 22–32)
Calcium: 9.2 mg/dL (ref 8.9–10.3)
Chloride: 100 mmol/L (ref 98–111)
Creatinine, Ser: 0.56 mg/dL (ref 0.44–1.00)
GFR calc Af Amer: >60 mL/min (ref >60)
GFR calc non Af Amer: >60 mL/min (ref >60)
Glucose, Bld: 100 mg/dL — ABNORMAL HIGH (ref 70–99)
Potassium: 3.2 mmol/L — ABNORMAL LOW (ref 3.5–5.1)
Sodium: 139 mmol/L (ref 135–145)
Total Bilirubin: 1.6 mg/dL — ABNORMAL HIGH (ref 0.3–1.2)
Total Protein: 7.7 g/dL (ref 6.5–8.1)

## 2020-01-16 LAB — CBC
HCT: 37.6 % (ref 36.0–46.0)
Hemoglobin: 12.2 g/dL (ref 12.0–15.0)
MCH: 30 pg (ref 26.0–34.0)
MCHC: 32.4 g/dL (ref 30.0–36.0)
MCV: 92.4 fL (ref 80.0–100.0)
Platelets: 162 10*3/uL (ref 150–400)
RBC: 4.07 MIL/uL (ref 3.87–5.11)
RDW: 15.1 % (ref 11.5–15.5)
WBC: 5 10*3/uL (ref 4.0–10.5)
nRBC: 0 % (ref 0.0–0.2)

## 2020-01-16 NOTE — ED Triage Notes (Signed)
Daughter states that the patient has been sluggish with her talking and weakness with her left hand. Daughter states that she noticed it about 11:00 this morning.

## 2020-01-17 ENCOUNTER — Observation Stay: Admit: 2020-01-17 | Payer: Medicare Other

## 2020-01-17 ENCOUNTER — Observation Stay: Payer: Medicare Other

## 2020-01-17 ENCOUNTER — Observation Stay
Admission: EM | Admit: 2020-01-17 | Discharge: 2020-01-18 | Disposition: A | Discharge status: Home Health | Payer: Medicare Other | Attending: Internal Medicine | Admitting: Internal Medicine

## 2020-01-17 ENCOUNTER — Telehealth: Payer: Self-pay

## 2020-01-17 DIAGNOSIS — R4182 Altered mental status, unspecified: Secondary | ICD-10-CM

## 2020-01-17 DIAGNOSIS — N390 Urinary tract infection, site not specified: Secondary | ICD-10-CM

## 2020-01-17 DIAGNOSIS — I639 Cerebral infarction, unspecified: Secondary | ICD-10-CM

## 2020-01-17 DIAGNOSIS — F0390 Unspecified dementia without behavioral disturbance: Secondary | ICD-10-CM

## 2020-01-17 DIAGNOSIS — G309 Alzheimer's disease, unspecified: Secondary | ICD-10-CM | POA: Diagnosis not present

## 2020-01-17 DIAGNOSIS — Z8673 Personal history of transient ischemic attack (TIA), and cerebral infarction without residual deficits: Secondary | ICD-10-CM

## 2020-01-17 DIAGNOSIS — G459 Transient cerebral ischemic attack, unspecified: Secondary | ICD-10-CM

## 2020-01-17 DIAGNOSIS — H547 Unspecified visual loss: Secondary | ICD-10-CM

## 2020-01-17 DIAGNOSIS — E876 Hypokalemia: Secondary | ICD-10-CM

## 2020-01-17 LAB — URINALYSIS, COMPLETE (UACMP) WITH MICROSCOPIC
Bacteria, UA: NONE SEEN
Bilirubin Urine: NEGATIVE
Glucose, UA: NEGATIVE mg/dL
Hgb urine dipstick: NEGATIVE
Ketones, ur: NEGATIVE mg/dL
Nitrite: NEGATIVE
Protein, ur: NEGATIVE mg/dL
Specific Gravity, Urine: 1.017 (ref 1.005–1.030)
pH: 5 (ref 5.0–8.0)

## 2020-01-17 LAB — SARS CORONAVIRUS 2 BY RT PCR (HOSPITAL ORDER, PERFORMED IN ~~LOC~~ HOSPITAL LAB): SARS Coronavirus 2: NEGATIVE

## 2020-01-17 LAB — CREATININE, SERUM
Creatinine, Ser: 0.48 mg/dL (ref 0.44–1.00)
GFR calc Af Amer: >60 mL/min (ref >60)
GFR calc non Af Amer: >60 mL/min (ref >60)

## 2020-01-17 LAB — TROPONIN I (HIGH SENSITIVITY): Troponin I (High Sensitivity): 7 ng/L (ref <18)

## 2020-01-17 MED ORDER — ATORVASTATIN CALCIUM 20 MG PO TABS
40.0000 mg | ORAL_TABLET | Freq: Every day | ORAL | Status: DC
  Administered 2020-01-18: 40 mg via ORAL
  Filled 2020-01-17: qty 2

## 2020-01-17 MED ORDER — POTASSIUM CHLORIDE 10 MEQ/100ML IV SOLN
10.0000 meq | INTRAVENOUS | Status: AC
  Administered 2020-01-17 (×2): 10 meq via INTRAVENOUS
  Filled 2020-01-17: qty 100

## 2020-01-17 MED ORDER — STROKE: EARLY STAGES OF RECOVERY BOOK: Freq: Once | Status: DC

## 2020-01-17 MED ORDER — POTASSIUM CHLORIDE 20 MEQ PO PACK
40.0000 meq | PACK | Freq: Once | ORAL | Status: AC
  Administered 2020-01-17: 40 meq via ORAL
  Filled 2020-01-17: qty 2

## 2020-01-17 MED ORDER — CLOPIDOGREL BISULFATE 75 MG PO TABS
75.0000 mg | ORAL_TABLET | Freq: Every day | ORAL | Status: DC
  Administered 2020-01-17 – 2020-01-18 (×2): 75 mg via ORAL
  Filled 2020-01-17 (×2): qty 1

## 2020-01-17 MED ORDER — ATORVASTATIN CALCIUM 20 MG PO TABS
20.0000 mg | ORAL_TABLET | Freq: Every day | ORAL | Status: DC
  Filled 2020-01-17: qty 1

## 2020-01-17 MED ORDER — ACETAMINOPHEN 325 MG PO TABS: 650.0000 mg | ORAL_TABLET | ORAL | Status: DC | PRN

## 2020-01-17 MED ORDER — ACETAMINOPHEN 650 MG RE SUPP: 650.0000 mg | RECTAL | Status: DC | PRN

## 2020-01-17 MED ORDER — POTASSIUM CHLORIDE CRYS ER 20 MEQ PO TBCR: 40.0000 meq | EXTENDED_RELEASE_TABLET | Freq: Once | ORAL | Status: DC

## 2020-01-17 MED ORDER — FOSFOMYCIN TROMETHAMINE 3 G PO PACK: 3.0000 g | PACK | Freq: Once | ORAL | Status: DC

## 2020-01-17 MED ORDER — SODIUM CHLORIDE 0.9 % IV BOLUS
500.0000 mL | Freq: Once | INTRAVENOUS | Status: AC
  Administered 2020-01-17: 500 mL via INTRAVENOUS

## 2020-01-17 MED ORDER — ACETAMINOPHEN 160 MG/5ML PO SOLN
650.0000 mg | ORAL | Status: DC | PRN
  Filled 2020-01-17: qty 20.3

## 2020-01-17 MED ORDER — ENOXAPARIN SODIUM 40 MG/0.4ML ~~LOC~~ SOLN
40.0000 mg | SUBCUTANEOUS | Status: DC
  Administered 2020-01-17: 40 mg via SUBCUTANEOUS
  Filled 2020-01-17 (×2): qty 0.4

## 2020-01-17 MED ORDER — ASPIRIN EC 81 MG PO TBEC
81.0000 mg | DELAYED_RELEASE_TABLET | Freq: Every day | ORAL | Status: DC
  Administered 2020-01-17 – 2020-01-18 (×2): 81 mg via ORAL
  Filled 2020-01-17 (×2): qty 1

## 2020-01-17 MED ORDER — ASPIRIN 81 MG PO CHEW
324.0000 mg | CHEWABLE_TABLET | Freq: Once | ORAL | Status: AC
  Administered 2020-01-17: 324 mg via ORAL
  Filled 2020-01-17: qty 4

## 2020-01-17 MED ORDER — POTASSIUM CHLORIDE 10 MEQ/100ML IV SOLN
10.0000 meq | INTRAVENOUS | Status: AC
  Administered 2020-01-17 (×2): 10 meq via INTRAVENOUS
  Filled 2020-01-17 (×3): qty 100

## 2020-01-17 NOTE — ED Notes (Signed)
ED Provider at bedside. 

## 2020-01-17 NOTE — ED Notes (Signed)
Patient transported to MRI 

## 2020-01-17 NOTE — Evaluation (Signed)
Occupational Therapy Evaluation Patient Details Name: Karen Krueger MRN: [id number] DOB: 12-28-1931 Today's Date: 01/17/2020    History of Present Illness presented to ER secondary to AMS, slurred speech; admitted for TIA/CVA work up.  Symptoms resolved upon arrival to ER; initial NIHSS 0. Head CT negative for acute infarct; MRI significant for subacute infarct bilat subcortical brain   Clinical Impression   Pt was seen for OT evaluation this date. Daughter present for duration of session. All PLOF provided by dtr. Prior to hospital admission, pt was a heavy assist for all stand pivot/quat pivot transfers to/from w/c, hospital bed, and BSC. Pt able to self feed with set up of finger foods/sandwiches 2/2 pt's blindness. Pt lives with her son, and son/dtr share caregiver responsibilities 24/7. Dtr reports pt not too far from baseline in terms of mobility assist levels. Currently pt demonstrates impairments as described below (See OT problem list) which functionally limit her ability to perform ADL/self-care tasks and ADL mobility. To maximize pt/caregiver safety, falls prevention, and participation in basic ADL and meaningful occupational engagement, pt would benefit from [facility] services with emphasis on caregiver training/education in dementia care strategies, ADL transfer training, and home/routines modifications to support pt/family in daily life.     Follow Up Recommendations  Home health OT;Supervision/Assistance - 24 hour    Equipment Recommendations  None recommended by OT    Recommendations for Other Services       Precautions / Restrictions Precautions Precautions: Fall Restrictions Weight Bearing Restrictions: No      Mobility Bed Mobility Overal bed mobility: Needs Assistance Bed Mobility: Supine to Sit;Sit to Supine     Supine to sit: Mod assist Sit to supine: Mod assist   General bed mobility comments: Max A +1-2 for repositioning in bed to improve joint and spinal  alignment and in preparation for RN giving oral medication  Transfers Overall transfer level: Needs assistance Equipment used: Rolling walker (2 wheeled) Transfers: Sit to/from [facility] Sit to Stand: Max assist Stand pivot transfers: Max assist       General transfer comment: dep assist for balance, walker position and management; difficulty coordinating steps for purposeful advancement.  Inconsistent closed-chain WBing, high risk for LE buckling, LOB.  Do recommend RW and +1-2 physical assist at all times    Balance Overall balance assessment: Needs assistance Sitting-balance support: No upper extremity supported;Feet supported Sitting balance-Leahy Scale: Fair     Standing balance support: Bilateral upper extremity supported Standing balance-Leahy Scale: Zero                             ADL either performed or assessed with clinical judgement   ADL Overall ADL's : Needs assistance/impaired;At baseline                                       General ADL Comments: Near baseline for ADL assist per daughter, requiring significant assist for ADL, set up/cues for self feeding of finger foods     Vision Baseline Vision/History: Legally blind Patient Visual Report: No change from baseline       Perception     Praxis      Pertinent Vitals/Pain Pain Assessment: Faces Faces Pain Scale: No hurt Pain Intervention(s): Limited activity within patient's tolerance;Monitored during session     Hand Dominance Right   Extremity/Trunk Assessment Upper Extremity Assessment  Upper Extremity Assessment: Generalized weakness (grossly at least 4-/5 throughout)   Lower Extremity Assessment Lower Extremity Assessment: Generalized weakness (grossly at least 4-/5 throughout; L knee lacking approx 20-25 degrees of extension)       Communication Communication Communication: No difficulties;Expressive difficulties;Receptive difficulties  (dementia)   Cognition Arousal/Alertness: Awake/alert Behavior During Therapy: WFL for tasks assessed/performed Overall Cognitive Status: History of cognitive impairments - at baseline                                 General Comments: Pt oriented to self, follows simple 1-step commands w/ verbal and sometimes tactile cues   General Comments       Exercises Other Exercises Other Exercises: Toilet transfer, SPT with RW, max assist +1 for lift off, standing balance, walker position/use.  Generally unable to sequence and complete task without extensive verbal/physical cuing Other Exercises: bed mobility for repositioning, daughter educated in dementia care strategies to support pt/caregiver safety during ADL/mobility, and strategies for connecting with pt in more positive ways through meaningful occupations for pt (i.e., Gospel music, old memories, not focusing on correcting facts, etc)   Shoulder Instructions      Home Living Family/patient expects to be discharged to:: Private residence Living Arrangements: Children (pt lives with son; son/dtr provide 100% of care) Available Help at Discharge: Family;Available 24 hours/day Type of Home: House                       Home Equipment: Wheelchair - manual;Hospital bed;Bedside commode   Additional Comments: Patient poor historian; unable to provide reliable history; dtr present and provides additional information      Prior Functioning/Environment Level of Independence: Needs assistance  Gait / Transfers Assistance Needed: Per dtr, family has hospital bed, w/c, BSC, and RW, although pt has been essentially a SPT to/from bed, w/c, and BSC for mobility ADL's / Homemaking Assistance Needed: Family provide transportation, meals, meds, housekeeping, and assist with all ADL tasks. Family provides pt with finger foods (e.g., sandwiches) for meals to improve pt's self feeding 2/2 legally blind   Comments: Patient poor  historian; unable to provide reliable history.  Will verify with family as availble. Per chart/previous documentation, mobility varies given baseline dementia, but family always tries to be with patient during mobility attempts.        OT Problem List: Decreased strength;Decreased range of motion;Decreased cognition;Decreased safety awareness;Impaired balance (sitting and/or standing);Decreased knowledge of use of DME or AE;Impaired vision/perception      OT Treatment/Interventions: Self-care/ADL training;Therapeutic activities;Cognitive remediation/compensation;Patient/family education    OT Goals(Current goals can be found in the care plan section) Acute Rehab OT Goals Patient Stated Goal: unable to state OT Goal Formulation: Patient unable to participate in goal setting Time For Goal Achievement: 01/31/20 Potential to Achieve Goals: Good ADL Goals Pt Will Transfer to Toilet: bedside commode;squat pivot transfer;stand pivot transfer (pivot to [facility] from w/c or hospital bed with caregiver independent in assisting) Additional ADL Goal #1: Pt will follow 1 step commands with verbal/tactile cues PRN to participate in daily ADL tasks with caregiver support.  OT Frequency: Min 1X/week   Barriers to D/C:            Co-evaluation              AM-PAC OT "6 Clicks" Daily Activity     Outcome Measure Help from another person eating meals?: A Little Help from  another person taking care of personal grooming?: A Little Help from another person toileting, which includes using toliet, bedpan, or urinal?: A Lot Help from another person bathing (including washing, rinsing, drying)?: A Lot Help from another person to put on and taking off regular upper body clothing?: A Little Help from another person to put on and taking off regular lower body clothing?: A Lot 6 Click Score: 15   End of Session    Activity Tolerance: Patient tolerated treatment well Patient left: in bed;with call  bell/phone within reach;with bed alarm set;with family/visitor present;with nursing/sitter in room  OT Visit Diagnosis: Other abnormalities of gait and mobility (R26.89);Other symptoms and signs involving cognitive function                Time: 0932-6712 OT Time Calculation (min): 32 min Charges:  OT General Charges $OT Visit: 1 Visit OT Evaluation $OT Eval Moderate Complexity: 1 Mod OT Treatments $Therapeutic Activity: 8-22 mins  Jeni Salles, MPH, MS, OTR/L ascom [phone] 01/17/20, 3:26 PM

## 2020-01-17 NOTE — Consult Note (Signed)
Requesting Physician: Kurtis Bushman    Chief Complaint: Confusion  I have been asked by Dr. Kurtis Bushman to see this patient in consultation for acute infarct.  HPI: Karen Krueger is an 86 y.o. female who due to mental status is unable to provide history therefore all history obtained from the chart.  Patient with medical history significant for dementia, blindness related to glaucoma and history of strokes who was brought in by her daughter and caregiver for an episode of confusion and slurred speech starting earlier in the day.  Initial NIHSS of 1.   Date last known well: 01/16/2020 Time last known well: Time: 11:00 tPA Given: No: Outside time window  Past Medical History:  Diagnosis Date  . Blind left eye   . Cancer ([place])    previous skin cancer   . Dementia ([place])   . Glaucoma    blind in Left eye   . Hypertension    controlled with medication   . Stroke [facility])    two previous strokes, 2005, 2011     Past Surgical History:  Procedure Laterality Date  . ABDOMINAL HYSTERECTOMY    . APPENDECTOMY    . AQUEOUS SHUNT Right 05/06/2016   Procedure: AQUEOUS SHUNT;  Surgeon: Ronnell Freshwater, MD;  Location: [place];  Service: Ophthalmology;  Laterality: Right;  SCLERAL PATCH GRAFT AHMED FP7  . EYE SURGERY     glucacoma numerous times     Family History  Problem Relation Age of Onset  . Heart disease Father   . Stroke Daughter    Social History:  reports that she has quit smoking. She has never used smokeless tobacco. She reports that she does not drink alcohol and does not use drugs.  Allergies:  Allergies  Allergen Reactions  . Codeine Nausea And Vomiting  . Sulfa Antibiotics Other (See Comments)    Unknown   . Penicillins Nausea Only    Medications:  I have reviewed the patient's current medications. Prior to Admission:  Medications Prior to Admission  Medication Sig Dispense Refill Last Dose  . latanoprost (XALATAN) 0.005 % ophthalmic solution Place 1 drop  into the left eye at bedtime.    Past Week at Unknown time  . timolol (TIMOPTIC) 0.5 % ophthalmic solution Place 1 drop into the right eye 2 (two) times daily.   01/16/2020 at Unknown time  . verapamil (CALAN-SR) 120 MG CR tablet Take 120 mg by mouth daily.    01/16/2020 at Unknown time  . naproxen (NAPROSYN) 500 MG tablet Take 1 tablet (500 mg total) by mouth 2 (two) times daily with a meal. (Patient not taking: Reported on 01/17/2020) 20 tablet 2 Completed Course at Unknown time  . sodium chloride 1 g tablet Take 1 tablet (1 g total) by mouth 2 (two) times daily with a meal. (Patient not taking: Reported on 01/17/2020) 10 tablet 0 Completed Course at Unknown time   Scheduled: .  stroke: mapping our early stages of recovery book   Does not apply Once  . aspirin EC  81 mg Oral Daily  . atorvastatin  20 mg Oral Daily  . enoxaparin (LOVENOX) injection  40 mg Subcutaneous Q24H  . fosfomycin  3 g Oral Once    ROS: History obtained from the patient  General ROS: negative for - chills, fatigue, fever, night sweats, weight gain or weight loss Psychological ROS: negative for - behavioral disorder, hallucinations, memory difficulties, mood swings or suicidal ideation Ophthalmic ROS: blind ENT ROS: negative for -  epistaxis, nasal discharge, oral lesions, sore throat, tinnitus or vertigo Allergy and Immunology ROS: negative for - hives or itchy/watery eyes Hematological and Lymphatic ROS: negative for - bleeding problems, bruising or swollen lymph nodes Endocrine ROS: negative for - galactorrhea, hair pattern changes, polydipsia/polyuria or temperature intolerance Respiratory ROS: negative for - cough, hemoptysis, shortness of breath or wheezing Cardiovascular ROS: negative for - chest pain, dyspnea on exertion, edema or irregular heartbeat Gastrointestinal ROS: negative for - abdominal pain, diarrhea, hematemesis, nausea/vomiting or stool incontinence Genito-Urinary ROS: negative for - dysuria,  hematuria, incontinence or urinary frequency/urgency Musculoskeletal ROS: joint pain Neurological ROS: as noted in HPI Dermatological ROS: negative for rash and skin lesion changes  Physical Examination: Blood pressure (!) 173/77, pulse 61, temperature 97.7 F (36.5 C), resp. rate 16, height 5\' 2"  (1.575 m), weight 47.6 kg, SpO2 99 %.  HEENT-  Normocephalic, no lesions, without obvious abnormality.  Normal external eye and conjunctiva.  Normal TM's bilaterally.  Normal auditory canals and external ears. Normal external nose, mucus membranes and septum.  Normal pharynx. Cardiovascular- S1, S2 normal, pulses palpable throughout   Lungs- chest clear, no wheezing, rales, normal symmetric air entry Abdomen- soft, non-tender; bowel sounds normal; no masses,  no organomegaly Extremities- no edema Lymph-no adenopathy palpable Musculoskeletal-no joint tenderness, deformity or swelling Skin-warm and dry, no hyperpigmentation, vitiligo, or suspicious lesions  Neurological Examination   Mental Status: Alert, oriented to name and that in the hospital.  Reported that it was the year 2000.  Did not know her age.   Speech fluent without evidence of aphasia.  Able to follow commands without difficulty. Cranial Nerves: II: Blind III,IV, VI: ptosis not present, extra-ocular motions intact bilaterally V,VII: smile symmetric, facial light touch sensation normal bilaterally VIII: hearing normal bilaterally IX,X: gag reflex present XI: bilateral shoulder shrug XII: midline tongue extension Motor: Right : Upper extremity   5/5    Left:     Upper extremity   5/5  Lower extremity   5/5     Lower extremity   1-2/5 Tone and bulk:normal tone throughout; no atrophy noted Sensory: Pinprick and light touch decreased in the LLE Deep Tendon Reflexes: Symmetric throughout Plantars: Right: upgoing   Left: upgoing Cerebellar: Not performed due to visual challenges Gait: not tested due to safety concerns     Laboratory Studies:  Basic Metabolic Panel: Recent Labs  Lab 01/16/20 2206 01/17/20 0747  NA 139  --   K 3.2*  --   CL 100  --   CO2 30  --   GLUCOSE 100*  --   BUN 21  --   CREATININE 0.56 0.48  CALCIUM 9.2  --     Liver Function Tests: Recent Labs  Lab 01/16/20 2206  AST 23  ALT 15  ALKPHOS 35*  BILITOT 1.6*  PROT 7.7  ALBUMIN 3.8   No results for input(s): LIPASE, AMYLASE in the last 168 hours. No results for input(s): AMMONIA in the last 168 hours.  CBC: Recent Labs  Lab 01/16/20 2206  WBC 5.0  HGB 12.2  HCT 37.6  MCV 92.4  PLT 162    Cardiac Enzymes: No results for input(s): CKTOTAL, CKMB, CKMBINDEX, TROPONINI in the last 168 hours.  BNP: Invalid input(s): POCBNP  CBG: No results for input(s): GLUCAP in the last 168 hours.  Microbiology: Results for orders placed or performed during the hospital encounter of 01/17/20  SARS Coronavirus 2 by RT PCR (hospital order, performed in [facility] hospital lab) Nasopharyngeal  Nasopharyngeal Swab     Status: None   Collection Time: 01/17/20  3:37 AM   Specimen: Nasopharyngeal Swab  Result Value Ref Range Status   SARS Coronavirus 2 NEGATIVE NEGATIVE Final    Comment: (NOTE) SARS-CoV-2 target nucleic acids are NOT DETECTED.  The SARS-CoV-2 RNA is generally detectable in upper and lower respiratory specimens during the acute phase of infection. The lowest concentration of SARS-CoV-2 viral copies this assay can detect is 250 copies / mL. A negative result does not preclude SARS-CoV-2 infection and should not be used as the sole basis for treatment or other patient management decisions.  A negative result may occur with improper specimen collection / handling, submission of specimen other than nasopharyngeal swab, presence of viral mutation(s) within the areas targeted by this assay, and inadequate number of viral copies (<250 copies / mL). A negative result must be combined with clinical observations,  patient history, and epidemiological information.  Fact Sheet for Patients:   StrictlyIdeas.no  Fact Sheet for Healthcare Providers: BankingDealers.co.za  This test is not yet approved or  cleared by the [place] FDA and has been authorized for detection and/or diagnosis of SARS-CoV-2 by FDA under an Emergency Use Authorization (EUA).  This EUA will remain in effect (meaning this test can be used) for the duration of the COVID-19 declaration under Section 564(b)(1) of the Act, 21 U.S.C. section 360bbb-3(b)(1), unless the authorization is terminated or revoked sooner.  Performed at [facility], [address] [place], [place] [address]     Coagulation Studies: No results for input(s): LABPROT, INR in the last 72 hours.  Urinalysis:  Recent Labs  Lab 01/17/20 0337  COLORURINE YELLOW*  LABSPEC 1.017  PHURINE 5.0  GLUCOSEU NEGATIVE  HGBUR NEGATIVE  BILIRUBINUR NEGATIVE  KETONESUR NEGATIVE  PROTEINUR NEGATIVE  NITRITE NEGATIVE  LEUKOCYTESUR SMALL*    Lipid Panel:    Component Value Date/Time   CHOL 158 07/14/2014 0446   TRIG 95 07/14/2014 0446   HDL 50 07/14/2014 0446   VLDL 19 07/14/2014 0446   LDLCALC 89 07/14/2014 0446    HgbA1C:  Lab Results  Component Value Date   HGBA1C 5.5 07/14/2014    Urine Drug Screen:  No results found for: LABOPIA, COCAINSCRNUR, LABBENZ, AMPHETMU, THCU, LABBARB  Alcohol Level: No results for input(s): ETH in the last 168 hours.  Other results: EKG: sinus bradycardia at 58 bpm  Imaging: CT Head Wo Contrast  Result Date: 01/16/2020 CLINICAL DATA:  Ataxia, stroke suspected EXAM: CT HEAD WITHOUT CONTRAST TECHNIQUE: Contiguous axial images were obtained from the base of the skull through the vertex without intravenous contrast. COMPARISON:  Head CT 07/04/2017 FINDINGS: Brain: Stable degree of atrophy and advanced chronic small vessel ischemia. Remote lacunar infarct in  the right basal ganglia. No intracranial hemorrhage, mass effect, or midline shift. No hydrocephalus. The basilar cisterns are patent. No evidence of territorial infarct or acute ischemia. No extra-axial or intracranial fluid collection. Vascular: Atherosclerosis of skullbase vasculature without hyperdense vessel or abnormal calcification. Skull: No fracture or focal lesion. Sinuses/Orbits: No acute findings.  Bilateral ocular surgery. Other: None. IMPRESSION: 1. No acute intracranial abnormality. 2. Stable atrophy and chronic small vessel ischemia. Remote lacunar infarct in the right basal ganglia. Electronically Signed   By: Keith Rake M.D.   On: 01/16/2020 22:35   MR BRAIN WO CONTRAST  Result Date: 01/17/2020 CLINICAL DATA:  Confusion and slurred speech which is now resolved EXAM: MRI HEAD WITHOUT CONTRAST TECHNIQUE: Multiplanar, multiecho pulse sequences  of the brain and surrounding structures were obtained without intravenous contrast. COMPARISON:  Head CT from yesterday FINDINGS: Brain: Subcentimeter areas of weakly restricted diffusion in the right internal capsule, lateral left putamen, and bilateral centrum semiovale. Brain volume is normal. Background of extensive chronic small vessel ischemia with confluent FLAIR hyperintensity. No hydrocephalus, masslike finding, or extra-axial collection. Few remote microhemorrhages. Remote lacunar infarcts in the bilateral cerebral white matter, right pons, and right thalamus. Vascular: Preserved flow voids. Skull and upper cervical spine: Normal marrow signal Sinuses/Orbits: Bilateral cataract resection and left intra-ocular collection with possible oil tamponade. There is history of left eye blindness. IMPRESSION: 1. Subacute small vessel infarcts in the bilateral subcortical brain. 2. Extensive chronic small vessel ischemia. Electronically Signed   By: Monte Fantasia M.D.   On: 01/17/2020 07:33   US Carotid Bilateral (at [facility] and [facility] only)  Result  Date: 01/17/2020 CLINICAL DATA:  86 year old female with a history of stroke EXAM: BILATERAL CAROTID DUPLEX ULTRASOUND TECHNIQUE: Pearline Cables scale imaging, color Doppler and duplex ultrasound were performed of bilateral carotid and vertebral arteries in the neck. COMPARISON:  None. FINDINGS: Criteria: Quantification of carotid stenosis is based on velocity parameters that correlate the residual internal carotid diameter with NASCET-based stenosis levels, using the diameter of the distal internal carotid lumen as the denominator for stenosis measurement. The following velocity measurements were obtained: RIGHT ICA:  Systolic 61 cm/sec, Diastolic 11 cm/sec CCA:  60 cm/sec SYSTOLIC ICA/CCA RATIO:  1.0 ECA:  64 cm/sec LEFT ICA:  Systolic 82 cm/sec, Diastolic 17 cm/sec CCA:  65 cm/sec SYSTOLIC ICA/CCA RATIO:  1.3 ECA:  50 cm/sec Right Brachial SBP: Not acquired Left Brachial SBP: Not acquired RIGHT CAROTID ARTERY: No significant calcifications of the right common carotid artery. Intermediate waveform maintained. Heterogeneous and partially calcified plaque at the right carotid bifurcation. No significant lumen shadowing. Low resistance waveform of the right ICA. Tortuosity RIGHT VERTEBRAL ARTERY: Antegrade flow with low resistance waveform. LEFT CAROTID ARTERY: No significant calcifications of the left common carotid artery. Intermediate waveform maintained. Heterogeneous and partially calcified plaque at the left carotid bifurcation without significant lumen shadowing. Low resistance waveform of the left ICA. Tortuosity LEFT VERTEBRAL ARTERY:  Antegrade flow with low resistance waveform. IMPRESSION: Color duplex indicates minimal heterogeneous and calcified plaque, with no hemodynamically significant stenosis by duplex criteria in the extracranial cerebrovascular circulation. Signed, Dulcy Fanny. Dellia Nims, RPVI [facility] Electronically Signed   By: Corrie Mckusick D.O.    On: 01/17/2020 11:16    Assessment: 86 y.o. female with medical history significant for dementia, blindness related to glaucoma and history of strokes who was brought in by her daughter and caregiver for an episode of confusion and slurred speech.  MRI of the brain personally reviewed and shows subacute infarcts bilaterally in the subcortical regions.  Extensive evidence of chronic small vessel disease.  Concern is for embolic etiology.  Patient on no antiplatelet therapy prior to admission.   Carotid dopplers show no evidence of hemodynamically significant stenosis.  Echocardiogram pending.  A1c pending, LDL pending.  Stroke Risk Factors - hypertension  Plan: 1. HgbA1c, fasting lipid panel pending.  Goal A1c<7.0, goal LDL<70.   2. High potency statin. 3. PT consult, OT consult, Speech consult 4. Echocardiogram pending 5. Prophylactic therapy-Dual antiplatelet therapy with ASA 81mg  and Plavix 75mg  for three weeks with change to ASA 81mg  daily alone as monotherapy after that time. 6. NPO until RN stroke swallow screen 7. Telemetry monitoring.  If  unremarkable during hospitalization wold consider referral for prolonged cardiac monitoring on an outpatient basis.   8. Frequent neuro checks   Alexis Goodell, MD Neurology [phone] 01/17/2020, 1:44 PM

## 2020-01-17 NOTE — Evaluation (Signed)
Clinical/Bedside Swallow Evaluation Patient Details  Name: Karen Krueger MRN: [id number] Date of Birth: 11-22-1931  Today's Date: 01/17/2020 Time: SLP Start Time (ACUTE ONLY): 1225 SLP Stop Time (ACUTE ONLY): 1315 SLP Time Calculation (min) (ACUTE ONLY): 50 min  Past Medical History:  Past Medical History:  Diagnosis Date  . Blind left eye   . Cancer ([place])    previous skin cancer   . Dementia ([place])   . Glaucoma    blind in Left eye   . Hypertension    controlled with medication   . Stroke [facility])    two previous strokes, 2005, 2011    Past Surgical History:  Past Surgical History:  Procedure Laterality Date  . ABDOMINAL HYSTERECTOMY    . APPENDECTOMY    . AQUEOUS SHUNT Right 05/06/2016   Procedure: AQUEOUS SHUNT;  Surgeon: Ronnell Freshwater, MD;  Location: [place];  Service: Ophthalmology;  Laterality: Right;  SCLERAL PATCH GRAFT AHMED FP7  . EYE SURGERY     glucacoma numerous times    HPI:  Pt is a 86 y.o. female with a history of blindness, Dementia due to Alzheimer's disease, hypertension, UTI, glaucoma, acute encephalopathy, shingles, and CVA brought to the ED from home by her daughter with a chief complaint of confusion, slurred speech and left hand weakness.  Daughter noted it around 57 AM.  States at the time patient speech was very garbled and she seemed very confused.  Normally patient is able to assist to transfer from wheelchair to bed but today patient was too weak to do so so daughter had to carry her.  Denies recent fever, cough, chest pain, shortness of breath, abdominal pain, nausea or vomiting.  Patient's daughter is Karen Krueger and she reports that she and her brother are the patient's caregivers.  Patient lives with her son and Karen Krueger comes over and helps with her care.  MRI revealed: Subacute small vessel infarcts; Extensive chronic small vessel ischemia.    Assessment / Plan / Recommendation Clinical Impression  Pt appears to present w/ adequate  oropharyngeal phase swallowing function w/ no oropharyngeal phase dysphagia appreciated; no neuromuscular swallowing deficits. Pt is at reduced risk for aspiration when following general aspiration precautions w/ oral intake. Pt does have a Baseline of Dementia; Cognitive decline can impact follow through w/ general aspiration precautions for safety -- pt's family assist w/ all of her ADLs per chart notes. Pt also has significant Vision decline/deficit for self-feeding and requires full support/setup w/ this at all meals. Pt supported and given cues on positioning fully upright for safer oral intake. She then consumed trials of thin liquids, purees, and solids w/ no overt clinical s/s of aspiration noted; clear vocal quality b/t trials. Respiratory effort remained calm during/post trials. Oral phase appeared [facility] for bolus management and timely swallowing except for min increased time for full mastication/clearing of soft solids -- applesauce was used to soften the cookie. Oral clearing achieved b/t trials given Time -- unsure if not min impact from Cognitive decline on attetnion during task. Recommend reducing distractions during meals to allow pt to focus on oral intake, meal. OM exam was [facility] for oral clearing; lingual/labial movements. No unilateral weakness. Pt often needed cues for follow through w/ feeding and needed support w/ feeding d/t Vision deficits -- pt Held her own Cup when drinking w/ setup support (increases safety w/ swallowing). Recommend a more mech soft diet(w/ meats cut, moistened foods) w/ thin liquids; general aspiration precautions.Recommend positioning and less talking/distraction  during meals to allow pt to focus on oral intake d/t Cognitive decline. Recommend Pills Whole in Puree IF any difficulty swallowing w/ liquids. These precautions were discussed w/ pt/NSG; posted in chart for NSG. No further skilled ST services indicated at this time. NSG to reconsult if any new needs while  admitted. Re: pt's cognitive-linguistic presentation: pt verbalized easily w/ SLP recalling basic information re: self and she followed directions w/ general cues. If any new changes are apparent in ADLs once pt discharges home, then PCP can be consulted for referral. Pt agreed. CM updated re: evaluation. SLP Visit Diagnosis: Dysphagia, unspecified (R13.10)    Aspiration Risk   (reduced following general precautions)    Diet Recommendation  Mech Soft diet w/ cut meats, moist foods; Thin liquids. General aspiration precautions. Support at meals w/ feeding d/t Vision decline -- pt can hold Cup to drink  Medication Administration: Whole meds with liquid (or Whole in Puree if needed for ease of swallowing)    Other  Recommendations Recommended Consults:  (dietician f/u if needed) Oral Care Recommendations: Oral care BID;Oral care before and after PO;Staff/trained caregiver to provide oral care Other Recommendations:  (n/a)   Follow up Recommendations None      Frequency and Duration  (n/a)   (n/a)       Prognosis Prognosis for Safe Diet Advancement: Good Barriers to Reach Goals: Cognitive deficits;Time post onset;Severity of deficits Barriers/Prognosis Comment: baseline Dementia      Swallow Study   General Date of Onset: 01/16/20 HPI: Pt is a 86 y.o. female with a history of blindness, Dementia due to Alzheimer's disease, hypertension, UTI, glaucoma, acute encephalopathy, shingles, and CVA brought to the ED from home by her daughter with a chief complaint of confusion, slurred speech and left hand weakness.  Daughter noted it around 45 AM.  States at the time patient speech was very garbled and she seemed very confused.  Normally patient is able to assist to transfer from wheelchair to bed but today patient was too weak to do so so daughter had to carry her.  Denies recent fever, cough, chest pain, shortness of breath, abdominal pain, nausea or vomiting.  Patient's daughter is Karen Krueger and  she reports that she and her brother are the patient's caregivers.  Patient lives with her son and Karen Krueger comes over and helps with her care.  MRI revealed: Subacute small vessel infarcts; Extensive chronic small vessel ischemia.  Type of Study: Bedside Swallow Evaluation Previous Swallow Assessment: none Diet Prior to this Study: NPO Temperature Spikes Noted: No (wbc 5.0) Respiratory Status: Room air History of Recent Intubation: No Behavior/Cognition: Alert;Cooperative;Pleasant mood;Confused;Distractible;Requires cueing (baseline of Dementia) Oral Cavity Assessment: Dry (min) Oral Care Completed by SLP: Yes Oral Cavity - Dentition: Adequate natural dentition Vision: Impaired for self-feeding Self-Feeding Abilities: Able to feed self;Needs assist;Needs set up;Total assist (pt can hold cup to drink) Patient Positioning: Upright in bed (needed positioning) Baseline Vocal Quality: Normal (adequate) Volitional Cough: Strong Volitional Swallow: Able to elicit    Oral/Motor/Sensory Function Overall Oral Motor/Sensory Function: Within functional limits   Ice Chips Ice chips: Within functional limits Presentation: Spoon (fed; 3 trials)   Thin Liquid Thin Liquid: Within functional limits Presentation: Cup;Self Fed;Straw (supported; ~4+ ozs)    Nectar Thick Nectar Thick Liquid: Not tested   Honey Thick Honey Thick Liquid: Not tested   Puree Puree: Within functional limits Presentation: Spoon;Self Fed (supported fully; 8 trials)   Solid     Solid: Within functional limits Presentation:  Spoon (fed; 6 trials)       Orinda Kenner, MS, CCC-SLP Noni Stonesifer 01/17/2020,1:46 PM

## 2020-01-17 NOTE — ED Notes (Addendum)
Pt visualized resting, daughter left for home. NAD noted.

## 2020-01-17 NOTE — H&P (Signed)
History and Physical    Karen Krueger DOB: 1931/11/19 DOA: 01/17/2020  PCP: Idelle Crouch, MD   Patient coming from: Home  I have personally briefly reviewed patient's old medical records in [place]  Chief Complaint: Confusion and slurred speech  HPI: Karen Krueger is a 86 y.o. female with medical history significant for dementia, blindness related to glaucoma and history of strokes who was brought in by her daughter and caregiver for an episode of confusion and slurred speech starting about 5 hours prior to coming into the emergency room with symptoms resolving by arrival. ED Course: CT head negative.  Blood work unremarkable.  EKG with sinus tachycardia  Review of Systems: Unable to obtain due to baseline of dementia   Past Medical History:  Diagnosis Date  . Blind left eye   . Cancer ([place])    previous skin cancer   . Dementia ([place])   . Glaucoma    blind in Left eye   . Hypertension    controlled with medication   . Stroke [facility])    two previous strokes, 2005, 2011     Past Surgical History:  Procedure Laterality Date  . ABDOMINAL HYSTERECTOMY    . APPENDECTOMY    . AQUEOUS SHUNT Right 05/06/2016   Procedure: AQUEOUS SHUNT;  Surgeon: Ronnell Freshwater, MD;  Location: [place];  Service: Ophthalmology;  Laterality: Right;  SCLERAL PATCH GRAFT AHMED FP7  . EYE SURGERY     glucacoma numerous times      reports that she has quit smoking. She has never used smokeless tobacco. She reports that she does not drink alcohol and does not use drugs.  Allergies  Allergen Reactions  . Codeine Nausea And Vomiting  . Sulfa Antibiotics Other (See Comments)    Unknown   . Penicillins Nausea Only    Family History  Problem Relation Age of Onset  . Heart disease Father   . Stroke Daughter       Prior to Admission medications   Medication Sig Start Date End Date Taking? Authorizing Provider  latanoprost (XALATAN) 0.005 % ophthalmic  solution Place 1 drop into the left eye at bedtime.    Yes [provider]  timolol (TIMOPTIC) 0.5 % ophthalmic solution Place 1 drop into the right eye 2 (two) times daily.   Yes [provider]  verapamil (CALAN-SR) 120 MG CR tablet Take 120 mg by mouth daily.    Yes [provider]  naproxen (NAPROSYN) 500 MG tablet Take 1 tablet (500 mg total) by mouth 2 (two) times daily with a meal. Patient not taking: Reported on 01/17/2020 08/12/16   Lavonia Drafts, MD  sodium chloride 1 g tablet Take 1 tablet (1 g total) by mouth 2 (two) times daily with a meal. Patient not taking: Reported on 01/17/2020 08/06/17   Gorden Harms, MD    Physical Exam: Vitals:   01/16/20 2158 01/16/20 2159 01/17/20 0017 01/17/20 0300  BP: 120/70  116/71 (!) 141/68  Pulse: (!) 55  (!) 57 60  Resp: 18   16  Temp: (!) 97.5 F (36.4 C)     TempSrc: Oral     SpO2: 100%  98% 99%  Weight:  47.6 kg    Height:  5\' 2"  (1.575 m)       Vitals:   01/16/20 2158 01/16/20 2159 01/17/20 0017 01/17/20 0300  BP: 120/70  116/71 (!) 141/68  Pulse: (!) 55  (!) 57 60  Resp: 18   16  Temp: (!) 97.5 F (36.4 C)     TempSrc: Oral     SpO2: 100%  98% 99%  Weight:  47.6 kg    Height:  5\' 2"  (1.575 m)        Constitutional: Alert and in no any apparent distress.  Oriented to person HEENT:      Head: Normocephalic and atraumatic.         Eyes: Right eye with cloudy conjunctiva.  Left eye without abnormality      Mouth/Throat: Mucous membranes are moist.       Neck: Supple with no signs of meningismus. Cardiovascular: Regular rate and rhythm. No murmurs, gallops, or rubs. 2+ symmetrical distal pulses are present . No JVD. No LE edema Respiratory: Respiratory effort normal .Lungs sounds clear bilaterally. No wheezes, crackles, or rhonchi.  Gastrointestinal: Soft, non tender, and non distended with positive bowel sounds. No rebound or guarding. Genitourinary: No CVA tenderness. Musculoskeletal:  Nontender with normal range of motion in all extremities. No cyanosis, or erythema of extremities. Neurologic: Normal speech and language. Face is symmetric. Moving all extremities. No gross focal neurologic deficits . Skin: Skin is warm, dry.  No rash or ulcers Psychiatric: Mood and affect are normal.  No abnormal behavior    Labs on Admission: I have personally reviewed following labs and imaging studies  CBC: Recent Labs  Lab 01/16/20 2206  WBC 5.0  HGB 12.2  HCT 37.6  MCV 92.4  PLT 081   Basic Metabolic Panel: Recent Labs  Lab 01/16/20 2206  NA 139  K 3.2*  CL 100  CO2 30  GLUCOSE 100*  BUN 21  CREATININE 0.56  CALCIUM 9.2   GFR: Estimated Creatinine Clearance: 38.6 mL/min (by C-G formula based on SCr of 0.56 mg/dL). Liver Function Tests: Recent Labs  Lab 01/16/20 2206  AST 23  ALT 15  ALKPHOS 35*  BILITOT 1.6*  PROT 7.7  ALBUMIN 3.8   No results for input(s): LIPASE, AMYLASE in the last 168 hours. No results for input(s): AMMONIA in the last 168 hours. Coagulation Profile: No results for input(s): INR, PROTIME in the last 168 hours. Cardiac Enzymes: No results for input(s): CKTOTAL, CKMB, CKMBINDEX, TROPONINI in the last 168 hours. BNP (last 3 results) No results for input(s): PROBNP in the last 8760 hours. HbA1C: No results for input(s): HGBA1C in the last 72 hours. CBG: No results for input(s): GLUCAP in the last 168 hours. Lipid Profile: No results for input(s): CHOL, HDL, LDLCALC, TRIG, CHOLHDL, LDLDIRECT in the last 72 hours. Thyroid Function Tests: No results for input(s): TSH, T4TOTAL, FREET4, T3FREE, THYROIDAB in the last 72 hours. Anemia Panel: No results for input(s): VITAMINB12, FOLATE, FERRITIN, TIBC, IRON, RETICCTPCT in the last 72 hours. Urine analysis:    Component Value Date/Time   COLORURINE YELLOW (A) 01/17/2020 0337   APPEARANCEUR HAZY (A) 01/17/2020 0337   APPEARANCEUR Clear 07/13/2014 1205   LABSPEC 1.017 01/17/2020 0337    LABSPEC 1.005 07/13/2014 1205   PHURINE 5.0 01/17/2020 0337   GLUCOSEU NEGATIVE 01/17/2020 0337   GLUCOSEU Negative 07/13/2014 1205   HGBUR NEGATIVE 01/17/2020 0337   BILIRUBINUR NEGATIVE 01/17/2020 0337   BILIRUBINUR Negative 07/13/2014 1205   KETONESUR NEGATIVE 01/17/2020 0337   PROTEINUR NEGATIVE 01/17/2020 0337   NITRITE NEGATIVE 01/17/2020 0337   LEUKOCYTESUR SMALL (A) 01/17/2020 0337   LEUKOCYTESUR Negative 07/13/2014 1205    Radiological Exams on Admission: CT Head Wo Contrast  Result Date: 01/16/2020 CLINICAL  DATA:  Ataxia, stroke suspected EXAM: CT HEAD WITHOUT CONTRAST TECHNIQUE: Contiguous axial images were obtained from the base of the skull through the vertex without intravenous contrast. COMPARISON:  Head CT 07/04/2017 FINDINGS: Brain: Stable degree of atrophy and advanced chronic small vessel ischemia. Remote lacunar infarct in the right basal ganglia. No intracranial hemorrhage, mass effect, or midline shift. No hydrocephalus. The basilar cisterns are patent. No evidence of territorial infarct or acute ischemia. No extra-axial or intracranial fluid collection. Vascular: Atherosclerosis of skullbase vasculature without hyperdense vessel or abnormal calcification. Skull: No fracture or focal lesion. Sinuses/Orbits: No acute findings.  Bilateral ocular surgery. Other: None. IMPRESSION: 1. No acute intracranial abnormality. 2. Stable atrophy and chronic small vessel ischemia. Remote lacunar infarct in the right basal ganglia. Electronically Signed   By: Keith Rake M.D.   On: 01/16/2020 22:35    EKG: Independently reviewed. Interpretation : Sinus tachycardia  Assessment/Plan Principal Problem:  86 year old female with history of CVA, dementia and blindness, nonambulatory at baseline presenting with a transient episode of confusion and slurred speech  TIA (transient ischemic attack)   History of CVA (cerebrovascular accident) -Confusion and slurred speech resolved by  arrival with NIH SS of 0. -Head CT negative -Given 324 mg of aspirin in the ER -Continue aspirin and statins -Stroke work-up to include cardiac monitoring, MRI, carotid Doppler -Consult neuro in the a.m. if necessary    Dementia due to Alzheimer's disease ([place]) -No acute concerns    Blindness -Increase nursing assistance for transfers, fall prevention  Nonambulatory status -Increase nursing assistance    DVT prophylaxis: Lovenox  Code Status: DNR Family Communication: Daughter at bedside.  Patient is DNR Disposition Plan: Back to previous home environment Consults called: none  Status:obs    Athena Masse MD [facility]     01/17/2020, 5:23 AM

## 2020-01-17 NOTE — TOC Initial Note (Signed)
Transition of Care [facility]) - Initial/Assessment Note    Patient Details  Name: Karen Krueger MRN: [id number] Date of Birth: 01/11/1932  Transition of [facility]) CM/SW Contact:    Shelbie Hutching, RN Phone Number: 01/17/2020, 12:22 PM  Clinical Narrative:                 Patient placed under observation for stroke.  This RNCM introduced self to patient and patient's daughter at the bedside and explained role in discharge planning.  Patient's daughter is [name] and she reports that she and her brother are the patient's caregivers.  Patient lives with her son and [name] comes over and helps with her care.  Patient has all needed equipment at home, wheelchair and hospital bed.  At last patient's PCP appointment with Dr. Doy Hutching she was referred to [facility] for [facility] health, this RNCM reached out to [facility] with [facility] but they were unable to accept the patient.  Patient's daughter would like to have home health services at discharge, this RNCM will search for home health agency to accept at discharge.  Patient is blind and has dementia, family has been caring for her at home going on 6 years now.   Expected Discharge Plan: [place] Barriers to Discharge: Continued Medical Work up   Patient Goals and CMS Choice Patient states their goals for this hospitalization and ongoing recovery are:: Daughter has questions about the patient's stroke CMS Medicare.gov Compare Post Acute Care list provided to:: Patient Represenative (must comment) Choice offered to / list presented to : [facility] POA / Guardian (daughter [name])  Expected Discharge Plan and Services Expected Discharge Plan: [place]   Discharge Planning Services: CM Consult Post Acute Care Choice: [place] arrangements for the past 2 months: Single Family Home                           HH Arranged: RN, PT, OT [facility] Agency: [place]        Prior Living  Arrangements/Services Living arrangements for the past 2 months: Single Family Home Lives with:: Adult Children Patient language and need for interpreter reviewed:: Yes Do you feel safe going back to the place where you live?: Yes      Need for Family Participation in Patient Care: Yes (Comment) (dementia, blind) Care giver support system in place?: Yes (comment) (son and daughter) Current home services: DME (wheelchair, hospital bed) Criminal Activity/Legal Involvement Pertinent to Current Situation/Hospitalization: No - Comment as needed  Activities of Daily Living      Permission Sought/Granted Permission sought to share information with : Case Manager, Family Supports Permission granted to share information with : Yes, Verbal Permission Granted  Share Information with NAME: [name]  Permission granted to share info w AGENCY: [facility]  Permission granted to share info w Relationship: daughter     Emotional Assessment Appearance:: Appears stated age Attitude/Demeanor/Rapport: Unable to Assess Affect (typically observed): Unable to Assess Orientation: : Oriented to Self Alcohol / Substance Use: Not Applicable Psych Involvement: No (comment)  Admission diagnosis:  TIA (transient ischemic attack) [G45.9] Hypokalemia [E87.6] Lower urinary tract infectious disease [N39.0] Stroke (cerebrum) (HCC) [I63.9] Altered mental status, unspecified altered mental status type [R41.82] Dementia without behavioral disturbance, unspecified dementia type ([place]) [F03.90] Patient Active Problem List   Diagnosis Date Noted  . TIA (transient ischemic attack) 01/17/2020  . Blindness 01/17/2020  . History of CVA (  cerebrovascular accident) 01/17/2020  . Hyponatremia 08/04/2017  . UTI (urinary tract infection) 08/04/2017  . Shingles outbreak 08/04/2017  . Altered mental status 01/05/2017  . Dementia due to Alzheimer's disease ([place]) 08/03/2016  . Fall 08/02/2016  . Acute encephalopathy 08/02/2016   . HTN (hypertension) 08/02/2016  . Glaucoma 08/02/2016  . Elevated troponin 08/02/2016   PCP:  Idelle Crouch, MD Pharmacy:   [place], [place] - [address] [place] [address] Phone: [phone] Fax: [id number] [place], [place] - [place] [address] Phone: [phone] Fax: [phone]     Social Determinants of Health (SDOH) Interventions    Readmission Risk Interventions No flowsheet data found.

## 2020-01-17 NOTE — ED Provider Notes (Signed)
[facility] Emergency Department Provider Note   ____________________________________________   First MD Initiated Contact with Patient 01/17/20 0300     (approximate)  I have reviewed the triage vital signs and the nursing notes.   HISTORY  Chief Complaint Altered Mental Status  Level V caveat: Limited by dementia  HPI Karen Krueger is a 86 y.o. female with a history of blindness, dementia, hypertension and CVA brought to the ED from home by her daughter with a chief complaint of confusion, slurred speech and left hand weakness.  Daughter noted it around 62 AM.  States at the time patient speech was very garbled and she seemed very confused.  Normally patient is able to assist to transfer from wheelchair to bed but today patient was too weak to do so so daughter had to carry her.  Denies recent fever, cough, chest pain, shortness of breath, abdominal pain, nausea or vomiting.  Denies recent travel or trauma.       Past Medical History:  Diagnosis Date  . Blind left eye   . Cancer ([place])    previous skin cancer   . Dementia ([place])   . Glaucoma    blind in Left eye   . Hypertension    controlled with medication   . Stroke [facility])    two previous strokes, 2005, 2011     Patient Active Problem List   Diagnosis Date Noted  . Hyponatremia 08/04/2017  . UTI (urinary tract infection) 08/04/2017  . Shingles outbreak 08/04/2017  . Altered mental status 01/05/2017  . Dementia due to Alzheimer's disease ([place]) 08/03/2016  . Fall 08/02/2016  . Acute encephalopathy 08/02/2016  . HTN (hypertension) 08/02/2016  . Glaucoma 08/02/2016  . Elevated troponin 08/02/2016    Past Surgical History:  Procedure Laterality Date  . ABDOMINAL HYSTERECTOMY    . APPENDECTOMY    . AQUEOUS SHUNT Right 05/06/2016   Procedure: AQUEOUS SHUNT;  Surgeon: Ronnell Freshwater, MD;  Location: [place];  Service: Ophthalmology;  Laterality: Right;  SCLERAL PATCH  GRAFT AHMED FP7  . EYE SURGERY     glucacoma numerous times     Prior to Admission medications   Medication Sig Start Date End Date Taking? Authorizing Provider  latanoprost (XALATAN) 0.005 % ophthalmic solution Place 1 drop into the left eye at bedtime.    Yes [provider]  timolol (TIMOPTIC) 0.5 % ophthalmic solution Place 1 drop into the right eye 2 (two) times daily.   Yes [provider]  verapamil (CALAN-SR) 120 MG CR tablet Take 120 mg by mouth daily.    Yes [provider]  naproxen (NAPROSYN) 500 MG tablet Take 1 tablet (500 mg total) by mouth 2 (two) times daily with a meal. Patient not taking: Reported on 01/17/2020 08/12/16   Lavonia Drafts, MD  sodium chloride 1 g tablet Take 1 tablet (1 g total) by mouth 2 (two) times daily with a meal. Patient not taking: Reported on 01/17/2020 08/06/17   Salary, Avel Peace, MD    Allergies Codeine, Sulfa antibiotics, and Penicillins  Family History  Problem Relation Age of Onset  . Heart disease Father   . Stroke Daughter     Social History Social History   Tobacco Use  . Smoking status: Former Research scientist (life sciences)  . Smokeless tobacco: Never Used  . Tobacco comment: quit over 45 yrs ago  Substance Use Topics  . Alcohol use: No  . Drug use: No    Review of  Systems  Constitutional: No fever/chills Eyes: No visual changes. ENT: No sore throat. Cardiovascular: Denies chest pain. Respiratory: Denies shortness of breath. Gastrointestinal: No abdominal pain.  No nausea, no vomiting.  No diarrhea.  No constipation. Genitourinary: Negative for dysuria. Musculoskeletal: Negative for back pain. Skin: Negative for rash. Neurological: Positive for confusion, garbled speech and focal weakness.  Negative for headaches or numbness.   ____________________________________________   PHYSICAL EXAM:  VITAL SIGNS: ED Triage Vitals  Enc Vitals Group     BP 01/16/20 2158 120/70     Pulse Rate 01/16/20 2158 (!) 55      Resp 01/16/20 2158 18     Temp 01/16/20 2158 (!) 97.5 F (36.4 C)     Temp Source 01/16/20 2158 Oral     SpO2 01/16/20 2158 100 %     Weight 01/16/20 2159 105 lb (47.6 kg)     Height 01/16/20 2159 5\' 2"  (1.575 m)     Head Circumference --      Peak Flow --      Pain Score 01/16/20 2159 0     Pain Loc --      Pain Edu? --      Excl. in [place]? --     Constitutional: Alert and oriented.  Elderly appearing and in no acute distress. Eyes: Conjunctivae are normal. Blind. EOMI. Head: Atraumatic. Nose: No congestion/rhinnorhea. Mouth/Throat: Mucous membranes are moist.   Neck: No stridor.   Cardiovascular: Normal rate, regular rhythm. Grossly normal heart sounds.  Good peripheral circulation. Respiratory: Normal respiratory effort.  No retractions. Lungs CTAB. Gastrointestinal: Soft and nontender. No distention. No abdominal bruits. No CVA tenderness. Musculoskeletal: No lower extremity tenderness nor edema.  No joint effusions. Neurologic: Alert and oriented to person and place.  Normal speech and language. No gross focal neurologic deficits are appreciated.  Skin:  Skin is warm, dry and intact. No rash noted. Psychiatric: Mood and affect are normal. Speech and behavior are normal.  ____________________________________________   LABS (all labs ordered are listed, but only abnormal results are displayed)  Labs Reviewed  COMPREHENSIVE METABOLIC PANEL - Abnormal; Notable for the following components:      Result Value   Potassium 3.2 (*)    Glucose, Bld 100 (*)    Alkaline Phosphatase 35 (*)    Total Bilirubin 1.6 (*)    All other components within normal limits  URINALYSIS, COMPLETE (UACMP) WITH MICROSCOPIC - Abnormal; Notable for the following components:   Color, Urine YELLOW (*)    APPearance HAZY (*)    Leukocytes,Ua SMALL (*)    All other components within normal limits  SARS CORONAVIRUS 2 BY RT PCR (HOSPITAL ORDER, [place] [facility])  URINE CULTURE  CBC   TROPONIN I (HIGH SENSITIVITY)   ____________________________________________  EKG  ED ECG REPORT I, Maiya Kates J, the attending physician, personally viewed and interpreted this ECG.   Date: 01/17/2020  EKG Time: 2153  Rate: 58  Rhythm: sinus bradycardia  Axis: Normal  Intervals:none  ST&T Change: Nonspecific  ____________________________________________  RADIOLOGY  ED MD interpretation: No ICH  Official radiology report(s): CT Head Wo Contrast  Result Date: 01/16/2020 CLINICAL DATA:  Ataxia, stroke suspected EXAM: CT HEAD WITHOUT CONTRAST TECHNIQUE: Contiguous axial images were obtained from the base of the skull through the vertex without intravenous contrast. COMPARISON:  Head CT 07/04/2017 FINDINGS: Brain: Stable degree of atrophy and advanced chronic small vessel ischemia. Remote lacunar infarct in the right basal ganglia. No intracranial hemorrhage, mass  effect, or midline shift. No hydrocephalus. The basilar cisterns are patent. No evidence of territorial infarct or acute ischemia. No extra-axial or intracranial fluid collection. Vascular: Atherosclerosis of skullbase vasculature without hyperdense vessel or abnormal calcification. Skull: No fracture or focal lesion. Sinuses/Orbits: No acute findings.  Bilateral ocular surgery. Other: None. IMPRESSION: 1. No acute intracranial abnormality. 2. Stable atrophy and chronic small vessel ischemia. Remote lacunar infarct in the right basal ganglia. Electronically Signed   By: Keith Rake M.D.   On: 01/16/2020 22:35    ____________________________________________   PROCEDURES  Procedure(s) performed (including Critical Care):  .1-3 Lead EKG Interpretation Performed by: Paulette Blanch, MD Authorized by: Paulette Blanch, MD     Interpretation: normal     ECG rate:  60   ECG rate assessment: normal     Rhythm: sinus rhythm     Ectopy: none     Conduction: normal   Comments:     Patient placed on cardiac monitor to evaluate  for arrhythmias   NIH Stroke Scale  Interval: Baseline Time: 5:13 AM Person Administering Scale: Lashante Fryberger J  Administer stroke scale items in the order listed. Record performance in each category after each subscale exam. Do not go back and change scores. Follow directions provided for each exam technique. Scores should reflect what the patient does, not what the clinician thinks the patient can do. The clinician should record answers while administering the exam and work quickly. Except where indicated, the patient should not be coached (i.e., repeated requests to patient to make a special effort).   1a  Level of consciousness: 0=alert; keenly responsive  1b. LOC questions:  0=Performs both tasks correctly  1c. LOC commands: 0=Performs both tasks correctly  2.  Best Gaze: 0=normal  3.  Visual: 0=No visual loss  4. Facial Palsy: 0=Normal symmetric movement  5a.  Motor left arm: 0=No drift, limb holds 90 (or 45) degrees for full 10 seconds  5b.  Motor right arm: 0=No drift, limb holds 90 (or 45) degrees for full 10 seconds  6a. motor left leg: 0=No drift, limb holds 90 (or 45) degrees for full 10 seconds  6b  Motor right leg:  0=No drift, limb holds 90 (or 45) degrees for full 10 seconds  7. Limb Ataxia: 0=Absent  8.  Sensory: 0=Normal; no sensory loss  9. Best Language:  0=No aphasia, normal  10. Dysarthria: 0=Normal  11. Extinction and Inattention: 0=No abnormality  12. Distal motor function: 0=Normal   Total:   0  Best Gaze/Visual: patient is blind  ____________________________________________   INITIAL IMPRESSION / ASSESSMENT AND PLAN / ED COURSE  As part of my medical decision making, I reviewed the following data within the [place] History obtained from family, Nursing notes reviewed and incorporated, Labs reviewed, EKG interpreted, Old chart reviewed, Radiograph reviewed, Discussed with admitting physician and Notes from prior ED visits     Kymberley ADIE VILAR was evaluated in Emergency Department on 01/17/2020 for the symptoms described in the history of present illness. She was evaluated in the context of the global COVID-19 pandemic, which necessitated consideration that the patient might be at risk for infection with the SARS-CoV-2 virus that causes COVID-19. Institutional protocols and algorithms that pertain to the evaluation of patients at risk for COVID-19 are in a state of rapid change based on information released by regulatory bodies including the CDC and federal and state organizations. These policies and algorithms were followed during the patient's care  in the ED.    86 year old female presenting with garbled speech and confusion at 67 AM; history of CVA. Differential diagnosis includes, but is not limited to, alcohol, illicit or prescription medications, or other toxic ingestion; intracranial pathology such as stroke or intracerebral hemorrhage; fever or infectious causes including sepsis; hypoxemia and/or hypercarbia; uremia; trauma; endocrine related disorders such as diabetes, hypoglycemia, and thyroid-related diseases; hypertensive encephalopathy; etc.  Laboratory results remarkable for mild hypokalemia.  CT negative for ICH.  Concern for TIA, evolving CVA.  Will check UA as well.  Administer aspirin.  Will discuss with hospitalist services for admission.      ____________________________________________   FINAL CLINICAL IMPRESSION(S) / ED DIAGNOSES  Final diagnoses:  Hypokalemia  Altered mental status, unspecified altered mental status type  Dementia without behavioral disturbance, unspecified dementia type ([place])  TIA (transient ischemic attack)  Lower urinary tract infectious disease     ED Discharge Orders    None       Note:  This document was prepared using Dragon voice recognition software and may include unintentional dictation errors.   Paulette Blanch, MD 01/17/20 [phone]

## 2020-01-17 NOTE — Progress Notes (Signed)
PROGRESS NOTE    Karen Krueger  ZOX:[id number] DOB: [birth year] DOA: 01/17/2020 PCP: Idelle Crouch, MD    Brief Narrative:  Karen Krueger is an 86 y.o. female who due to mental status is unable to provide history therefore all history obtained from the chart.  Patientwith medical history significant fordementia, blindness related to glaucoma and history of strokes who was brought in by her daughter and caregiver for an episode of confusion and slurred speech starting earlier in the day    Consultants:   What neurology  Procedures:  Antimicrobials:      Subjective: daughter at bedside.  Patient is blind in both eyes.  Per daughter her speech has improved.  And more interactive.  Patient has no complaints.  Objective: Vitals:   01/17/20 0300 01/17/20 0634 01/17/20 0902 01/17/20 1207  BP: (!) 141/68 (!) 144/73 (!) 157/90 (!) 173/77  Pulse: 60 67 (!) 57 61  Resp: 16 16    Temp:  97.8 F (36.6 C)  97.7 F (36.5 C)  TempSrc:  Oral    SpO2: 99% 99% 100% 99%  Weight:      Height:        Intake/Output Summary (Last 24 hours) at 01/17/2020 1427 Last data filed at 01/17/2020 0510 Gross per 24 hour  Intake 500 ml  Output --  Net 500 ml   Filed Weights   01/16/20 2159  Weight: 47.6 kg    Examination:  General exam: Appears calm and comfortable, interactive, NAD bilaterally blind Respiratory system: Clear to auscultation. Respiratory effort normal. Cardiovascular system: S1 & S2 heard, RRR. No JVD, murmurs, rubs, gallops or clicks. No pedal edema. Gastrointestinal system: Abdomen is nondistended, soft and nontender. No organomegaly or masses felt. Normal bowel sounds heard. Central nervous system: Alert and oriented x3.  Grossly intact.  5-5 strength x4 Extremities: No edema Skin: Warm dry Psychiatry: Judgement and insight appear normal. Mood & affect appropriate.     Data Reviewed: I have personally reviewed following labs and imaging studies  CBC: Recent Labs   Lab 01/16/20 2206  WBC 5.0  HGB 12.2  HCT 37.6  MCV 92.4  PLT 811   Basic Metabolic Panel: Recent Labs  Lab 01/16/20 2206 01/17/20 0747  NA 139  --   K 3.2*  --   CL 100  --   CO2 30  --   GLUCOSE 100*  --   BUN 21  --   CREATININE 0.56 0.48  CALCIUM 9.2  --    GFR: Estimated Creatinine Clearance: 38.6 mL/min (by C-G formula based on SCr of 0.48 mg/dL). Liver Function Tests: Recent Labs  Lab 01/16/20 2206  AST 23  ALT 15  ALKPHOS 35*  BILITOT 1.6*  PROT 7.7  ALBUMIN 3.8   No results for input(s): LIPASE, AMYLASE in the last 168 hours. No results for input(s): AMMONIA in the last 168 hours. Coagulation Profile: No results for input(s): INR, PROTIME in the last 168 hours. Cardiac Enzymes: No results for input(s): CKTOTAL, CKMB, CKMBINDEX, TROPONINI in the last 168 hours. BNP (last 3 results) No results for input(s): PROBNP in the last 8760 hours. HbA1C: No results for input(s): HGBA1C in the last 72 hours. CBG: No results for input(s): GLUCAP in the last 168 hours. Lipid Profile: No results for input(s): CHOL, HDL, LDLCALC, TRIG, CHOLHDL, LDLDIRECT in the last 72 hours. Thyroid Function Tests: No results for input(s): TSH, T4TOTAL, FREET4, T3FREE, THYROIDAB in the last 72 hours. Anemia Panel: No results  for input(s): VITAMINB12, FOLATE, FERRITIN, TIBC, IRON, RETICCTPCT in the last 72 hours. Sepsis Labs: No results for input(s): PROCALCITON, LATICACIDVEN in the last 168 hours.  Recent Results (from the past 240 hour(s))  SARS Coronavirus 2 by RT PCR (hospital order, performed in [facility] hospital lab) Nasopharyngeal Nasopharyngeal Swab     Status: None   Collection Time: 01/17/20  3:37 AM   Specimen: Nasopharyngeal Swab  Result Value Ref Range Status   SARS Coronavirus 2 NEGATIVE NEGATIVE Final    Comment: (NOTE) SARS-CoV-2 target nucleic acids are NOT DETECTED.  The SARS-CoV-2 RNA is generally detectable in upper and lower respiratory specimens  during the acute phase of infection. The lowest concentration of SARS-CoV-2 viral copies this assay can detect is 250 copies / mL. A negative result does not preclude SARS-CoV-2 infection and should not be used as the sole basis for treatment or other patient management decisions.  A negative result may occur with improper specimen collection / handling, submission of specimen other than nasopharyngeal swab, presence of viral mutation(s) within the areas targeted by this assay, and inadequate number of viral copies (<250 copies / mL). A negative result must be combined with clinical observations, patient history, and epidemiological information.  Fact Sheet for Patients:   StrictlyIdeas.no  Fact Sheet for Healthcare Providers: BankingDealers.co.za  This test is not yet approved or  cleared by the [place] FDA and has been authorized for detection and/or diagnosis of SARS-CoV-2 by FDA under an Emergency Use Authorization (EUA).  This EUA will remain in effect (meaning this test can be used) for the duration of the COVID-19 declaration under Section 564(b)(1) of the Act, 21 U.S.C. section 360bbb-3(b)(1), unless the authorization is terminated or revoked sooner.  Performed at [facility], [address] [place], [place] [address]          Radiology Studies: CT Head Wo Contrast  Result Date: 01/16/2020 CLINICAL DATA:  Ataxia, stroke suspected EXAM: CT HEAD WITHOUT CONTRAST TECHNIQUE: Contiguous axial images were obtained from the base of the skull through the vertex without intravenous contrast. COMPARISON:  Head CT 07/04/2017 FINDINGS: Brain: Stable degree of atrophy and advanced chronic small vessel ischemia. Remote lacunar infarct in the right basal ganglia. No intracranial hemorrhage, mass effect, or midline shift. No hydrocephalus. The basilar cisterns are patent. No evidence of territorial infarct or acute ischemia.  No extra-axial or intracranial fluid collection. Vascular: Atherosclerosis of skullbase vasculature without hyperdense vessel or abnormal calcification. Skull: No fracture or focal lesion. Sinuses/Orbits: No acute findings.  Bilateral ocular surgery. Other: None. IMPRESSION: 1. No acute intracranial abnormality. 2. Stable atrophy and chronic small vessel ischemia. Remote lacunar infarct in the right basal ganglia. Electronically Signed   By: Keith Rake M.D.   On: 01/16/2020 22:35   MR BRAIN WO CONTRAST  Result Date: 01/17/2020 CLINICAL DATA:  Confusion and slurred speech which is now resolved EXAM: MRI HEAD WITHOUT CONTRAST TECHNIQUE: Multiplanar, multiecho pulse sequences of the brain and surrounding structures were obtained without intravenous contrast. COMPARISON:  Head CT from yesterday FINDINGS: Brain: Subcentimeter areas of weakly restricted diffusion in the right internal capsule, lateral left putamen, and bilateral centrum semiovale. Brain volume is normal. Background of extensive chronic small vessel ischemia with confluent FLAIR hyperintensity. No hydrocephalus, masslike finding, or extra-axial collection. Few remote microhemorrhages. Remote lacunar infarcts in the bilateral cerebral white matter, right pons, and right thalamus. Vascular: Preserved flow voids. Skull and upper cervical spine: Normal marrow signal Sinuses/Orbits: Bilateral cataract resection and  left intra-ocular collection with possible oil tamponade. There is history of left eye blindness. IMPRESSION: 1. Subacute small vessel infarcts in the bilateral subcortical brain. 2. Extensive chronic small vessel ischemia. Electronically Signed   By: Monte Fantasia M.D.   On: 01/17/2020 07:33   US Carotid Bilateral (at [facility] and [facility] only)  Result Date: 01/17/2020 CLINICAL DATA:  86 year old female with a history of stroke EXAM: BILATERAL CAROTID DUPLEX ULTRASOUND TECHNIQUE: Pearline Cables scale imaging, color Doppler and duplex ultrasound were  performed of bilateral carotid and vertebral arteries in the neck. COMPARISON:  None. FINDINGS: Criteria: Quantification of carotid stenosis is based on velocity parameters that correlate the residual internal carotid diameter with NASCET-based stenosis levels, using the diameter of the distal internal carotid lumen as the denominator for stenosis measurement. The following velocity measurements were obtained: RIGHT ICA:  Systolic 61 cm/sec, Diastolic 11 cm/sec CCA:  60 cm/sec SYSTOLIC ICA/CCA RATIO:  1.0 ECA:  64 cm/sec LEFT ICA:  Systolic 82 cm/sec, Diastolic 17 cm/sec CCA:  65 cm/sec SYSTOLIC ICA/CCA RATIO:  1.3 ECA:  50 cm/sec Right Brachial SBP: Not acquired Left Brachial SBP: Not acquired RIGHT CAROTID ARTERY: No significant calcifications of the right common carotid artery. Intermediate waveform maintained. Heterogeneous and partially calcified plaque at the right carotid bifurcation. No significant lumen shadowing. Low resistance waveform of the right ICA. Tortuosity RIGHT VERTEBRAL ARTERY: Antegrade flow with low resistance waveform. LEFT CAROTID ARTERY: No significant calcifications of the left common carotid artery. Intermediate waveform maintained. Heterogeneous and partially calcified plaque at the left carotid bifurcation without significant lumen shadowing. Low resistance waveform of the left ICA. Tortuosity LEFT VERTEBRAL ARTERY:  Antegrade flow with low resistance waveform. IMPRESSION: Color duplex indicates minimal heterogeneous and calcified plaque, with no hemodynamically significant stenosis by duplex criteria in the extracranial cerebrovascular circulation. Signed, Dulcy Fanny. Dellia Nims, RPVI [facility] Electronically Signed   By: Corrie Mckusick D.O.   On: 01/17/2020 11:16        Scheduled Meds: .  stroke: mapping our early stages of recovery book   Does not apply Once  . aspirin EC  81 mg Oral Daily  . atorvastatin  20 mg Oral  Daily  . enoxaparin (LOVENOX) injection  40 mg Subcutaneous Q24H  . fosfomycin  3 g Oral Once   Continuous Infusions:  Assessment & Plan:   Principal Problem:   TIA (transient ischemic attack) Active Problems:   Dementia due to Alzheimer's disease ([place])   Blindness   History of CVA (cerebrovascular accident)   Slurred speech/subacute small vessel infarct- Speech has improved to baseline. MRI findings with Subacute small vessel infarcts in the bilateral subcortical   With history of CVA (cerebrovascular accident) -Confusion and slurred speech resolved by arrival with NIH SS of 0. -Head CT negative Echo pending Ultrasound of carotids with no hemodynamic stenosis Neurology was consulted input was appreciated-Prophylactic therapy-Dual antiplatelet therapy with ASA 81mg  and Plavix 75mg  for three weeks with change to ASA 81mg  daily alone as monotherapy after that time. Will start high potency statin Check fasting lipid panel with goal LDL less than 70 Goal A1c less than 7 PT with home PT Concern for embolic etiology will ask cardiology to send patient 30-day monitor with follow-up Neurochecks     Dementia due to Alzheimer's disease ([place]) -No acute concerns    Blindness -Increase nursing assistance for transfers, fall prevention She has her children taking care of her 24/7 at home  Nonambulatory status -Increase nursing  assistance    DVT prophylaxis: Lovenox  Code Status: DNR Family Communication: Daughter at bedside.  Patient is DNR Disposition Plan: Back to previous home environment Consults called: none  Status:obs   DVT prophylaxis: Lovenox Code Status: DNR Family Communication: Daughter at bedside updated Disposition Plan: Back home with PT Status is: Observation     Dispo: The patient is from: Home              Anticipated d/c is to: Home              Anticipated d/c date is:6/29              Patient currently is not medically stable to d/c.             LOS: 0 days   Time spent: 45 minutes with more than 50% on Media, MD [facility] Pager [phone]  If 7PM-7AM, please contact night-coverage [email or web address] Password [email or web address] 01/17/2020, 2:27 PM

## 2020-01-17 NOTE — Telephone Encounter (Signed)
-----   Message from Rise Mu, [place] sent at 01/17/2020  3:23 PM EDT ----- Thank you! ----- Message ----- From: Alroy Dust.Daleen Bo, RN Sent: 01/17/2020   3:08 PM EDT To: Rise Mu, PA-C  Yes, I can do this tomorrow! Ayriel Texidor  ----- Message ----- From: Sindy Messing Sent: 01/17/2020   2:52 PM EDT To: Valora Corporal, RN, Emily Filbert, RN, #  Hello all!  The hospitalist asked Dr. Fletcher Anon and I if someone could place a real-time Zio on this patient Tuesday, 6/29 prior to her going home. She will need office follow up in 1 month with an MD, as she will be a new patient, to discuss the above monitor. She is in room 116.  Thanks!  Ryan

## 2020-01-17 NOTE — ED Notes (Addendum)
Pt's daughter is bedside. Daughter reports that pt is "doing better than she was" regarding her speech and mental status but still not back to baseline.  Pt is oriented to self and time. NAD noted, VSS.

## 2020-01-17 NOTE — TOC Progression Note (Signed)
Transition of Care [facility]) - Progression Note    Patient Details  Name: Karen Krueger MRN: [id number] Date of Birth: 08/21/1931  Transition of Care [facility]) CM/SW Contact  Shelbie Hutching, RN Phone Number: 01/17/2020, 3:36 PM  Clinical Narrative:    [facility] is unable to accept patient.  [facility] has accepted home health referral for RN, PT, and OT.  Anticipated discharge is tomorrow.     Expected Discharge Plan: [place] Barriers to Discharge: Continued Medical Work up  Expected Discharge Plan and Services Expected Discharge Plan: [place]   Discharge Planning Services: CM Consult Post Acute Care Choice: [place] arrangements for the past 2 months: [place]: PT, OT [place] Agency: [place] ([place]) Date [place]: 01/17/20 Time [place]: [place] Representative spoke with at [place]: [place] (SDOH) Interventions    Readmission Risk Interventions No flowsheet data found.

## 2020-01-17 NOTE — Evaluation (Signed)
Physical Therapy Evaluation Patient Details Name: Karen Krueger MRN: [id number] DOB: [birth year] Today's Date: 01/17/2020   History of Present Illness  presented to ER secondary to AMS, slurred speech; admitted for TIA/CVA work up.  Symptoms resolved upon arrival to ER; initial NIHSS 0. Head CT negative for acute infarct; MRI significant for subacute infarct bilat subcortical brain  Clinical Impression  Patient awake in bed, fidgeting with purewick upon arrival to room; voices need to "go to the bathroom".  Oriented to self only; however, pleasant and cooperative within abilities throughout session.  Difficulty following simple verbal commands (due to cognition rather than language), often requiring hand-over-hand to initiate and complete functional activities.  Generally weak and deconditioned throughout all extremities; L knee lacking 20-25 degrees extension.  Currently requiring max/dep for bed mobility, sit/stand and basic transfers with RW.  Inconsistent LE strength, balance and overall performance due to cognitive deficits.  Generally poor standing balance, absent self-righting reactions; very high risk for buckling and requiring dep assist for walker placement/management with transfer.  Unsafe to attempt without extensive physical assist. Would benefit from skilled PT to address above deficits and promote optimal return to PLOF.; Recommend transition to HHPT upon discharge from acute hospitalization, pending ability to participate/progress with skilled interventions and confirmation of baseline ability with family/caregiver.     Follow Up Recommendations Home health PT (pending ability to participate/progress and confirmation of baseline ability)    Equipment Recommendations       Recommendations for Other Services       Precautions / Restrictions Precautions Precautions: Fall Restrictions Weight Bearing Restrictions: No      Mobility  Bed Mobility Overal bed mobility: Needs  Assistance Bed Mobility: Supine to Sit;Sit to Supine     Supine to sit: Mod assist Sit to supine: Mod assist   General bed mobility comments: for trunk control, LE elevation  Transfers Overall transfer level: Needs assistance Equipment used: Rolling walker (2 wheeled) Transfers: Sit to/from [facility] Sit to Stand: Max assist Stand pivot transfers: Max assist       General transfer comment: dep assist for balance, walker position and management; difficulty coordinating steps for purposeful advancement.  Inconsistent closed-chain WBing, high risk for LE buckling, LOB.  Do recommend RW and +1-2 physical assist at all times  Ambulation/Gait             General Gait Details: unsafe/unable  Stairs            Wheelchair Mobility    Modified Rankin (Stroke Patients Only)       Balance Overall balance assessment: Needs assistance Sitting-balance support: No upper extremity supported;Feet supported Sitting balance-Leahy Scale: Fair     Standing balance support: Bilateral upper extremity supported Standing balance-Leahy Scale: Zero                               Pertinent Vitals/Pain Pain Assessment: Faces Faces Pain Scale: No hurt    Home Living                   Additional Comments: Patient poor historian; unable to provide reliable history.  Will verify with family as availble. Per chart/previous documentation, lives with daughter (available 24/7)    Prior Function Level of Independence: Needs assistance         Comments: Patient poor historian; unable to provide reliable history.  Will verify with family as availble. Per chart/previous documentation, mobility varies  given baseline dementia, but family always tries to be with patient during mobility attempts.     Hand Dominance   Dominant Hand: Right    Extremity/Trunk Assessment   Upper Extremity Assessment Upper Extremity Assessment: Generalized weakness  (grossly at least 4-/5 throughout)    Lower Extremity Assessment Lower Extremity Assessment: Generalized weakness (grossly at least 4-/5 throughout; L knee lacking approx 20-25 degrees of extension)       Communication   Communication: No difficulties  Cognition Arousal/Alertness: Awake/alert Behavior During Therapy: Restless                                   General Comments: oriented to self only; inconsistently follows simple, one-step commands, generally requiring hand-over-hand for task comprehension (due to both cognitive status and visual deficits)      General Comments      Exercises Other Exercises Other Exercises: Toilet transfer, SPT with RW, max assist +1 for lift off, standing balance, walker position/use.  Generally unable to sequence and complete task without extensive verbal/physical cuing   Assessment/Plan    PT Assessment Patient needs continued PT services  PT Problem List Decreased strength;Decreased activity tolerance;Decreased range of motion;Decreased balance;Decreased mobility;Decreased coordination;Decreased cognition;Decreased knowledge of use of DME;Decreased safety awareness;Decreased knowledge of precautions;Cardiopulmonary status limiting activity       PT Treatment Interventions DME instruction;Functional mobility training;Therapeutic exercise;Balance training;Gait training;Therapeutic activities;Patient/family education;Cognitive remediation    PT Goals (Current goals can be found in the Care Plan section)  Acute Rehab PT Goals PT Goal Formulation: Patient unable to participate in goal setting Time For Goal Achievement: 01/31/20 Potential to Achieve Goals: Fair    Frequency Min 2X/week   Barriers to discharge        Co-evaluation               AM-PAC PT "6 Clicks" Mobility  Outcome Measure Help needed turning from your back to your side while in a flat bed without using bedrails?: A Lot Help needed moving from  lying on your back to sitting on the side of a flat bed without using bedrails?: A Lot Help needed moving to and from a bed to a chair (including a wheelchair)?: Total Help needed standing up from a chair using your arms (e.g., wheelchair or bedside chair)?: Total Help needed to walk in hospital room?: Total Help needed climbing 3-5 steps with a railing? : Total 6 Click Score: 8    End of Session Equipment Utilized During Treatment: Gait belt Activity Tolerance:  (limited by cognition/visual deficits) Patient left: in bed;with call bell/phone within reach;with bed alarm set Nurse Communication: Mobility status PT Visit Diagnosis: Muscle weakness (generalized) (M62.81);Difficulty in walking, not elsewhere classified (R26.2)    Time: 1125-1201 PT Time Calculation (min) (ACUTE ONLY): 36 min   Charges:   PT Evaluation $PT Eval Moderate Complexity: 1 Mod PT Treatments $Therapeutic Exercise: 8-22 mins        Kryslyn Helbig H. Owens Shark, PT, DPT, NCS 01/17/20, 2:00 PM [phone]

## 2020-01-17 NOTE — Care Management Obs Status (Signed)
[place] NOTIFICATION   Patient Details  Name: Karen Krueger MRN: [id number] Date of Birth: 1931-12-12   Medicare Observation Status Notification Given:  Yes    Shelbie Hutching, RN 01/17/2020, 4:13 PM

## 2020-01-18 ENCOUNTER — Other Ambulatory Visit: Payer: Self-pay | Admitting: *Deleted

## 2020-01-18 ENCOUNTER — Observation Stay (INDEPENDENT_AMBULATORY_CARE_PROVIDER_SITE_OTHER): Payer: Medicare Other

## 2020-01-18 ENCOUNTER — Observation Stay (HOSPITAL_BASED_OUTPATIENT_CLINIC_OR_DEPARTMENT_OTHER)
Admit: 2020-01-18 | Discharge: 2020-01-18 | Disposition: A | Discharge status: Home / Self Care | Payer: Medicare Other | Attending: Internal Medicine | Admitting: Internal Medicine

## 2020-01-18 DIAGNOSIS — G459 Transient cerebral ischemic attack, unspecified: Secondary | ICD-10-CM | POA: Diagnosis not present

## 2020-01-18 DIAGNOSIS — G309 Alzheimer's disease, unspecified: Secondary | ICD-10-CM | POA: Diagnosis not present

## 2020-01-18 DIAGNOSIS — I351 Nonrheumatic aortic (valve) insufficiency: Secondary | ICD-10-CM | POA: Diagnosis not present

## 2020-01-18 DIAGNOSIS — I639 Cerebral infarction, unspecified: Secondary | ICD-10-CM | POA: Diagnosis not present

## 2020-01-18 LAB — LIPID PANEL
Cholesterol: 210 mg/dL — ABNORMAL HIGH (ref 0–200)
HDL: 45 mg/dL (ref >40)
LDL Cholesterol: 151 mg/dL — ABNORMAL HIGH (ref 0–99)
Total CHOL/HDL Ratio: 4.7 RATIO
Triglycerides: 72 mg/dL (ref <150)
VLDL: 14 mg/dL (ref 0–40)

## 2020-01-18 LAB — URINE CULTURE

## 2020-01-18 LAB — CBC
HCT: 35 % — ABNORMAL LOW (ref 36.0–46.0)
Hemoglobin: 11.6 g/dL — ABNORMAL LOW (ref 12.0–15.0)
MCH: 30.3 pg (ref 26.0–34.0)
MCHC: 33.1 g/dL (ref 30.0–36.0)
MCV: 91.4 fL (ref 80.0–100.0)
Platelets: 150 10*3/uL (ref 150–400)
RBC: 3.83 MIL/uL — ABNORMAL LOW (ref 3.87–5.11)
RDW: 14.9 % (ref 11.5–15.5)
WBC: 4 10*3/uL (ref 4.0–10.5)
nRBC: 0 % (ref 0.0–0.2)

## 2020-01-18 LAB — ECHOCARDIOGRAM COMPLETE

## 2020-01-18 LAB — POTASSIUM: Potassium: 4.6 mmol/L (ref 3.5–5.1)

## 2020-01-18 MED ORDER — ATORVASTATIN CALCIUM 40 MG PO TABS: 40.0000 mg | ORAL_TABLET | Freq: Every day | ORAL | 0 refills | Status: AC

## 2020-01-18 MED ORDER — ASPIRIN 81 MG PO TBEC: 81.0000 mg | DELAYED_RELEASE_TABLET | Freq: Every day | ORAL | 0 refills | Status: DC

## 2020-01-18 MED ORDER — CLOPIDOGREL BISULFATE 75 MG PO TABS: 75.0000 mg | ORAL_TABLET | Freq: Every day | ORAL | 0 refills | Status: AC

## 2020-01-18 MED ORDER — STROKE: EARLY STAGES OF RECOVERY BOOK: 1.0000 | Freq: Once | 0 refills | Status: AC

## 2020-01-18 NOTE — Telephone Encounter (Addendum)
ZIO AT placed on the patient today prior to discharge. Her daughter was present in the room when the monitor was placed. This was explained to the patient's daughter due to the patient's dementia status.   The patient's daughter, [name] was advised to call us with any questions/ concerns.  ZIO AT ID#: [id number]  Registered in [place].

## 2020-01-18 NOTE — Progress Notes (Signed)
Subjective: Patient remains stable.  No new neurological complaints.    Objective: Current vital signs: BP (!) 155/78 (BP Location: Right Arm)   Pulse 60   Temp 97.6 F (36.4 C)   Resp 16   Ht 5\' 2"  (1.575 m)   Wt 47.6 kg   SpO2 100%   BMI 19.20 kg/m  Vital signs in last 24 hours: Temp:  [97.4 F (36.3 C)-98.2 F (36.8 C)] 97.6 F (36.4 C) (06/29 0856) Pulse Rate:  [60-75] 60 (06/29 0856) Resp:  [16-18] 16 (06/29 0856) BP: (121-173)/(67-81) 155/78 (06/29 0856) SpO2:  [97 %-100 %] 100 % (06/29 0856)  Intake/Output from previous day: 06/28 0701 - 06/29 0700 In: -  Out: 400 [Urine:400] Intake/Output this shift: Total I/O In: 120 [P.O.:120] Out: 725 [Urine:725] Nutritional status:  Diet Order            DIET DYS 3 Room service appropriate? Yes with Assist; Fluid consistency: Thin  Diet effective now                 Neurologic Exam: Mental Status: Alert, oriented to name and that in the hospital.  Speech fluent without evidence of aphasia.  Able to follow commands without difficulty. Cranial Nerves: II: Blind III,IV, VI: ptosis not present, extra-ocular motions intact bilaterally V,VII: smile symmetric, facial light touch sensation normal bilaterally VIII: hearing normal bilaterally IX,X: gag reflex present XI: bilateral shoulder shrug XII: midline tongue extension Motor: Right :  Upper extremity   5/5                                      Left:     Upper extremity   5/5             Lower extremity   5/5                                                  Lower extremity   1-2/5 Tone and bulk:normal tone throughout; no atrophy noted Sensory: Pinprick and light touch decreased in the LLE   Lab Results: Basic Metabolic Panel: Recent Labs  Lab 01/16/20 2206 01/17/20 0747 01/18/20 0432  NA 139  --   --   K 3.2*  --  4.6  CL 100  --   --   CO2 30  --   --   GLUCOSE 100*  --   --   BUN 21  --   --   CREATININE 0.56 0.48  --   CALCIUM 9.2  --   --      Liver Function Tests: Recent Labs  Lab 01/16/20 2206  AST 23  ALT 15  ALKPHOS 35*  BILITOT 1.6*  PROT 7.7  ALBUMIN 3.8   No results for input(s): LIPASE, AMYLASE in the last 168 hours. No results for input(s): AMMONIA in the last 168 hours.  CBC: Recent Labs  Lab 01/16/20 2206 01/18/20 0432  WBC 5.0 4.0  HGB 12.2 11.6*  HCT 37.6 35.0*  MCV 92.4 91.4  PLT 162 150    Cardiac Enzymes: No results for input(s): CKTOTAL, CKMB, CKMBINDEX, TROPONINI in the last 168 hours.  Lipid Panel: Recent Labs  Lab 01/18/20 0432  CHOL 210*  TRIG 72  HDL 45  CHOLHDL 4.7  VLDL 14  LDLCALC 151*    CBG: No results for input(s): GLUCAP in the last 168 hours.  Microbiology: Results for orders placed or performed during the hospital encounter of 01/17/20  SARS Coronavirus 2 by RT PCR (hospital order, performed in [facility] hospital lab) Nasopharyngeal Nasopharyngeal Swab     Status: None   Collection Time: 01/17/20  3:37 AM   Specimen: Nasopharyngeal Swab  Result Value Ref Range Status   SARS Coronavirus 2 NEGATIVE NEGATIVE Final    Comment: (NOTE) SARS-CoV-2 target nucleic acids are NOT DETECTED.  The SARS-CoV-2 RNA is generally detectable in upper and lower respiratory specimens during the acute phase of infection. The lowest concentration of SARS-CoV-2 viral copies this assay can detect is 250 copies / mL. A negative result does not preclude SARS-CoV-2 infection and should not be used as the sole basis for treatment or other patient management decisions.  A negative result may occur with improper specimen collection / handling, submission of specimen other than nasopharyngeal swab, presence of viral mutation(s) within the areas targeted by this assay, and inadequate number of viral copies (<250 copies / mL). A negative result must be combined with clinical observations, patient history, and epidemiological information.  Fact Sheet for Patients:    StrictlyIdeas.no  Fact Sheet for Healthcare Providers: BankingDealers.co.za  This test is not yet approved or  cleared by the [place] FDA and has been authorized for detection and/or diagnosis of SARS-CoV-2 by FDA under an Emergency Use Authorization (EUA).  This EUA will remain in effect (meaning this test can be used) for the duration of the COVID-19 declaration under Section 564(b)(1) of the Act, 21 U.S.C. section 360bbb-3(b)(1), unless the authorization is terminated or revoked sooner.  Performed at [facility], [address] [place], [place] [address]     Coagulation Studies: No results for input(s): LABPROT, INR in the last 72 hours.  Imaging: CT Head Wo Contrast  Result Date: 01/16/2020 CLINICAL DATA:  Ataxia, stroke suspected EXAM: CT HEAD WITHOUT CONTRAST TECHNIQUE: Contiguous axial images were obtained from the base of the skull through the vertex without intravenous contrast. COMPARISON:  Head CT 07/04/2017 FINDINGS: Brain: Stable degree of atrophy and advanced chronic small vessel ischemia. Remote lacunar infarct in the right basal ganglia. No intracranial hemorrhage, mass effect, or midline shift. No hydrocephalus. The basilar cisterns are patent. No evidence of territorial infarct or acute ischemia. No extra-axial or intracranial fluid collection. Vascular: Atherosclerosis of skullbase vasculature without hyperdense vessel or abnormal calcification. Skull: No fracture or focal lesion. Sinuses/Orbits: No acute findings.  Bilateral ocular surgery. Other: None. IMPRESSION: 1. No acute intracranial abnormality. 2. Stable atrophy and chronic small vessel ischemia. Remote lacunar infarct in the right basal ganglia. Electronically Signed   By: Keith Rake M.D.   On: 01/16/2020 22:35   MR BRAIN WO CONTRAST  Result Date: 01/17/2020 CLINICAL DATA:  Confusion and slurred speech which is now resolved EXAM: MRI HEAD  WITHOUT CONTRAST TECHNIQUE: Multiplanar, multiecho pulse sequences of the brain and surrounding structures were obtained without intravenous contrast. COMPARISON:  Head CT from yesterday FINDINGS: Brain: Subcentimeter areas of weakly restricted diffusion in the right internal capsule, lateral left putamen, and bilateral centrum semiovale. Brain volume is normal. Background of extensive chronic small vessel ischemia with confluent FLAIR hyperintensity. No hydrocephalus, masslike finding, or extra-axial collection. Few remote microhemorrhages. Remote lacunar infarcts in the bilateral cerebral white matter, right pons, and right thalamus. Vascular: Preserved flow voids. Skull and upper cervical spine: Normal  marrow signal Sinuses/Orbits: Bilateral cataract resection and left intra-ocular collection with possible oil tamponade. There is history of left eye blindness. IMPRESSION: 1. Subacute small vessel infarcts in the bilateral subcortical brain. 2. Extensive chronic small vessel ischemia. Electronically Signed   By: Monte Fantasia M.D.   On: 01/17/2020 07:33   US Carotid Bilateral (at [facility] and [facility] only)  Result Date: 01/17/2020 CLINICAL DATA:  86 year old female with a history of stroke EXAM: BILATERAL CAROTID DUPLEX ULTRASOUND TECHNIQUE: Pearline Cables scale imaging, color Doppler and duplex ultrasound were performed of bilateral carotid and vertebral arteries in the neck. COMPARISON:  None. FINDINGS: Criteria: Quantification of carotid stenosis is based on velocity parameters that correlate the residual internal carotid diameter with NASCET-based stenosis levels, using the diameter of the distal internal carotid lumen as the denominator for stenosis measurement. The following velocity measurements were obtained: RIGHT ICA:  Systolic 61 cm/sec, Diastolic 11 cm/sec CCA:  60 cm/sec SYSTOLIC ICA/CCA RATIO:  1.0 ECA:  64 cm/sec LEFT ICA:  Systolic 82 cm/sec, Diastolic 17 cm/sec CCA:  65 cm/sec SYSTOLIC ICA/CCA RATIO:  1.3 ECA:   50 cm/sec Right Brachial SBP: Not acquired Left Brachial SBP: Not acquired RIGHT CAROTID ARTERY: No significant calcifications of the right common carotid artery. Intermediate waveform maintained. Heterogeneous and partially calcified plaque at the right carotid bifurcation. No significant lumen shadowing. Low resistance waveform of the right ICA. Tortuosity RIGHT VERTEBRAL ARTERY: Antegrade flow with low resistance waveform. LEFT CAROTID ARTERY: No significant calcifications of the left common carotid artery. Intermediate waveform maintained. Heterogeneous and partially calcified plaque at the left carotid bifurcation without significant lumen shadowing. Low resistance waveform of the left ICA. Tortuosity LEFT VERTEBRAL ARTERY:  Antegrade flow with low resistance waveform. IMPRESSION: Color duplex indicates minimal heterogeneous and calcified plaque, with no hemodynamically significant stenosis by duplex criteria in the extracranial cerebrovascular circulation. Signed, Dulcy Fanny. Dellia Nims, RPVI [facility] Electronically Signed   By: Corrie Mckusick D.O.   On: 01/17/2020 11:16   ECHOCARDIOGRAM COMPLETE  Result Date: 01/18/2020    ECHOCARDIOGRAM REPORT   Patient Name:   Karen Krueger Date of Exam: 01/18/2020 Medical Rec #:  [id number]    Height:       62.0 in Accession #:    [id number]   Weight:       105.0 lb Date of Birth:  20-Jul-1932     BSA:          1.454 m Patient Age:    86 years     BP:           121/74 mmHg Patient Gender: F            HR:           74 bpm. Exam Location:  [facility] Procedure: 2D Echo, Color Doppler and Cardiac Doppler Indications:     Stroke 434.91  History:         Patient has prior history of Echocardiogram examinations, most                  recent 08/03/2016. Stroke; Risk Factors:Hypertension.  Sonographer:     Sherrie Sport RDCS (AE) Referring Phys:  [id number] [facility] AMERY Diagnosing Phys: Nelva Bush MD IMPRESSIONS  1. Left  ventricular ejection fraction, by estimation, is 65 to 70%. The left ventricle has normal function. The left ventricle has no regional wall motion abnormalities. There is severe asymmetric left ventricular hypertrophy of the septal segment. Left  ventricular diastolic  parameters are consistent with Grade I diastolic dysfunction (impaired relaxation).  2. Right ventricular systolic function is normal. The right ventricular size is normal. Moderately increased right ventricular wall thickness.  3. The mitral valve is grossly normal. Trivial mitral valve regurgitation. No evidence of mitral stenosis.  4. The aortic valve is tricuspid. Aortic valve regurgitation is mild. No aortic stenosis is present.  5. Mildly dilated pulmonary artery. FINDINGS  Left Ventricle: Left ventricular ejection fraction, by estimation, is 65 to 70%. The left ventricle has normal function. The left ventricle has no regional wall motion abnormalities. The left ventricular internal cavity size was normal in size. There is  severe asymmetric left ventricular hypertrophy of the septal segment. Left ventricular diastolic parameters are consistent with Grade I diastolic dysfunction (impaired relaxation). Right Ventricle: The right ventricular size is normal. Moderately increased right ventricular wall thickness. Right ventricular systolic function is normal. Left Atrium: Left atrial size was normal in size. Right Atrium: Right atrial size was normal in size. Pericardium: A small pericardial effusion is present. The pericardial effusion is posterior and lateral to the left ventricle. Mitral Valve: The mitral valve is grossly normal. There is mild thickening of the mitral valve leaflet(s). Trivial mitral valve regurgitation. No evidence of mitral valve stenosis. Tricuspid Valve: The tricuspid valve is grossly normal. Tricuspid valve regurgitation is trivial. Aortic Valve: The aortic valve is tricuspid. Aortic valve regurgitation is mild. No aortic  stenosis is present. Aortic valve mean gradient measures 4.0 mmHg. Aortic valve peak gradient measures 6.4 mmHg. Aortic valve area, by VTI measures 3.18 cm. Pulmonic Valve: The pulmonic valve was normal in structure. Pulmonic valve regurgitation is trivial. No evidence of pulmonic stenosis. Aorta: The aortic root is normal in size and structure. Pulmonary Artery: The pulmonary artery is mildly dilated. Venous: The inferior vena cava was not well visualized. IAS/Shunts: The interatrial septum was not well visualized.  LEFT VENTRICLE PLAX 2D LVIDd:         2.63 cm  Diastology LVIDs:         1.42 cm  LV e' lateral:   5.11 cm/s LV PW:         1.20 cm  LV E/e' lateral: 8.6 LV IVS:        1.08 cm  LV e' medial:    3.37 cm/s LVOT diam:     2.00 cm  LV E/e' medial:  13.1 LV SV:         95 LV SV Index:   65 LVOT Area:     3.14 cm  RIGHT VENTRICLE RV Basal diam:  2.71 cm RV S prime:     14.70 cm/s TAPSE (M-mode): 3.0 cm LEFT ATRIUM             Index      RIGHT ATRIUM           Index LA diam:        1.90 cm 1.31 cm/m RA Area:     12.30 cm LA Vol (A2C):   8.4 ml  5.79 ml/m RA Volume:   26.40 ml  18.16 ml/m LA Vol (A4C):   12.9 ml 8.87 ml/m LA Biplane Vol: 10.5 ml 7.22 ml/m  AORTIC VALVE                   PULMONIC VALVE AV Area (Vmax):    3.14 cm    PV Vmax:        0.60 m/s AV Area (Vmean):   2.81 cm  PV Peak grad:   1.4 mmHg AV Area (VTI):     3.18 cm    RVOT Peak grad: 2 mmHg AV Vmax:           126.00 cm/s AV Vmean:          95.300 cm/s AV VTI:            0.299 m AV Peak Grad:      6.4 mmHg AV Mean Grad:      4.0 mmHg LVOT Vmax:         126.00 cm/s LVOT Vmean:        85.100 cm/s LVOT VTI:          0.302 m LVOT/AV VTI ratio: 1.01  AORTA Ao Root diam: 2.80 cm MITRAL VALVE               TRICUSPID VALVE MV Area (PHT): 2.57 cm    TR Peak grad:   29.4 mmHg MV Decel Time: 295 msec    TR Vmax:        271.00 cm/s MV E velocity: 44.10 cm/s MV A velocity: 73.30 cm/s  SHUNTS MV E/A ratio:  0.60        Systemic VTI:  0.30 m                             Systemic Diam: 2.00 cm Nelva Bush MD Electronically signed by Nelva Bush MD Signature Date/Time: 01/18/2020/10:17:36 AM    Final     Medications:  I have reviewed the patient's current medications. Scheduled: .  stroke: mapping our early stages of recovery book   Does not apply Once  . aspirin EC  81 mg Oral Daily  . atorvastatin  40 mg Oral Daily  . clopidogrel  75 mg Oral Daily  . enoxaparin (LOVENOX) injection  40 mg Subcutaneous Q24H  . fosfomycin  3 g Oral Once    Assessment/Plan: 86 y.o. 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  MRI of the brain personally reviewed and shows subacute infarcts bilaterally in the subcortical regions.  Extensive evidence of chronic small vessel disease.  Concern is for embolic etiology.  Patient on no antiplatelet therapy prior to admission.   Carotid dopplers show no evidence of hemodynamically significant stenosis.  Echocardiogram shows no cardiac source of emboli with EF of 65-70%.  A1c pending, LDL 151.   Plan: 1. [place] for lipid management with target LDL<70. 2. PT consult, OT consult, Speech consult 3. Prophylactic therapy-Dual antiplatelet therapy with ASA 81mg  and Plavix 75mg  for three weeks with change to ASA 81mg  daily alone as monotherapy after that time. 4. Telemetry monitoring.  If unremarkable during hospitalization wold consider referral for prolonged cardiac monitoring on an outpatient basis.   5. Patient to follow up with neurology on an outpatient basis   LOS: 0 days   Alexis Goodell, MD Neurology [phone] 01/18/2020  10:58 AM

## 2020-01-18 NOTE — TOC Transition Note (Signed)
Transition of Care [facility]) - CM/SW Discharge Note   Patient Details  Name: Karen Krueger MRN: [id number] Date of Birth: [birth year]  Transition of Care [facility]) CM/SW Contact:  Shelbie Hutching, RN Phone Number: 01/18/2020, 12:40 PM   Clinical Narrative:    Patient is medically clear to discharge home with home health services today through [place].  Patient's daughter will provide transportation home- no equipment needs.     Final next level of care: Home w Home Health Services Barriers to Discharge: Barriers Resolved   Patient Goals and CMS Choice Patient states their goals for this hospitalization and ongoing recovery are:: Daughter has questions about the patient's stroke CMS Medicare.gov Compare Post Acute Care list provided to:: Patient Represenative (must comment) Choice offered to / list presented to : Adult Children, [place] / [place]  Discharge Placement                       Discharge Plan and Services   Discharge Planning Services: CM Consult Post Acute Care Choice: Home Health                    HH Arranged: PT, OT [place] Agency: [place] ([place]) Date [place]: 01/18/20 Time [place]: 28 Representative spoke with at [place]: [place] (SDOH) Interventions     Readmission Risk Interventions No flowsheet data found.

## 2020-01-18 NOTE — Progress Notes (Signed)
*  PRELIMINARY RESULTS* Echocardiogram 2D Echocardiogram has been performed.  Sherrie Sport 01/18/2020, 8:54 AM

## 2020-01-18 NOTE — Discharge Summary (Signed)
Karen Krueger EZM:[id number] DOB: [birth year] DOA: 01/17/2020  PCP: Idelle Crouch, MD  Admit date: 01/17/2020 Discharge date: 01/18/2020  Admitted From: home Disposition:  home  Recommendations for Outpatient Follow-up:  1. Follow up with PCP in 1 week 2. Please obtain BMP/CBC in one week 3. Cardiology Dr. Rockey Situ in one month 4. Follow-up with Dr. Manuella Ghazi neurology in 1 week  Home Health:PT/OT    Discharge Condition:Stable CODE STATUS:DNR  Diet recommendation: Heart Healthy-dysphagia 3 Brief/Interim Summary: Karen Krueger is a 86 y.o. female with medical history significant for dementia, blindness related to glaucoma and history of strokes who was brought in by her daughter and caregiver for an episode of confusion and slurred speech starting about 5 hours prior to coming into the emergency room with symptoms resolving by arrival.   Slurred speech/subacute small vessel infarct- Speech has improved to baseline. MRI findings with Subacute small vessel infarcts in the bilateral subcortical With history of CVA (cerebrovascular accident) -Confusion and slurred speech resolved by arrival with NIH SS of 0. -Head CT negative Echo ef nml Ultrasound of carotids with no hemodynamic stenosis Neurology was consulted input was appreciated-Prophylactic therapy-Dual antiplatelet therapy with ASA 81mg  and Plavix 75mg  for three weeks with change toASA 81mg  dailyalone as monotherapy after that time. Started on statin goal LDL less than 70 Goal A1c less than 7 PT with home PT Concern for embolic etiology cards placed 30-day monitor with follow-up as outpt. Currently at baseline and ok to be discharged home   Dementia due to Alzheimer's disease ([place]) -No acute concerns  Blindness -Increase nursing assistance for transfers, fall prevention She has her children taking care of her 24/7 at home  Nonambulatory status -Increase nursing assistance- PT/OT   Discharge Diagnoses:   Principal Problem:   TIA (transient ischemic attack) Active Problems:   Dementia due to Alzheimer's disease ([place])   Blindness   History of CVA (cerebrovascular accident)    Discharge Instructions  Discharge Instructions    Diet - low sodium heart healthy   Complete by: As directed    Discharge instructions   Complete by: As directed    Follow up with cardiology in 1 month for monitor  F/u wih pcp in one week Do not take naproxen or NSAIDs as risk of bleed   Increase activity slowly   Complete by: As directed      Allergies as of 01/18/2020      Reactions   Codeine Nausea And Vomiting   Sulfa Antibiotics Other (See Comments)   Unknown   Penicillins Nausea Only      Medication List    STOP taking these medications   naproxen 500 MG tablet Commonly known as: Naprosyn   sodium chloride 1 g tablet     TAKE these medications    stroke: mapping our early stages of recovery book Misc 1 each by Does not apply route once for 1 dose.   aspirin 81 MG EC tablet Take 1 tablet (81 mg total) by mouth daily. Swallow whole. Start taking on: January 19, 2020   atorvastatin 40 MG tablet Commonly known as: LIPITOR Take 1 tablet (40 mg total) by mouth daily. Start taking on: January 19, 2020   clopidogrel 75 MG tablet Commonly known as: PLAVIX Take 1 tablet (75 mg total) by mouth daily for 21 days. Start taking on: January 19, 2020   latanoprost 0.005 % ophthalmic solution Commonly known as: XALATAN Place 1 drop into the left eye at bedtime.  timolol 0.5 % ophthalmic solution Commonly known as: TIMOPTIC Place 1 drop into the right eye 2 (two) times daily.   verapamil 120 MG CR tablet Commonly known as: CALAN-SR Take 120 mg by mouth daily.       Follow-up Information    Minna Merritts, MD Follow up in 1 week(s).   Specialty: Cardiology Why: needs monitor placement for stroke Contact information: [place] [address] [phone]         Idelle Crouch, MD Follow up in 1 week(s).   Specialty: Internal Medicine Contact information: [place] [address] [phone]        Vladimir Crofts, MD Follow up in 1 week(s).   Specialty: Neurology Contact information: [place] [facility] [place] [address] [phone]              Allergies  Allergen Reactions  . Codeine Nausea And Vomiting  . Sulfa Antibiotics Other (See Comments)    Unknown   . Penicillins Nausea Only    Consultations:  Neurology   Procedures/Studies: CT Head Wo Contrast  Result Date: 01/16/2020 CLINICAL DATA:  Ataxia, stroke suspected EXAM: CT HEAD WITHOUT CONTRAST TECHNIQUE: Contiguous axial images were obtained from the base of the skull through the vertex without intravenous contrast. COMPARISON:  Head CT 07/04/2017 FINDINGS: Brain: Stable degree of atrophy and advanced chronic small vessel ischemia. Remote lacunar infarct in the right basal ganglia. No intracranial hemorrhage, mass effect, or midline shift. No hydrocephalus. The basilar cisterns are patent. No evidence of territorial infarct or acute ischemia. No extra-axial or intracranial fluid collection. Vascular: Atherosclerosis of skullbase vasculature without hyperdense vessel or abnormal calcification. Skull: No fracture or focal lesion. Sinuses/Orbits: No acute findings.  Bilateral ocular surgery. Other: None. IMPRESSION: 1. No acute intracranial abnormality. 2. Stable atrophy and chronic small vessel ischemia. Remote lacunar infarct in the right basal ganglia. Electronically Signed   By: Keith Rake M.D.   On: 01/16/2020 22:35   MR BRAIN WO CONTRAST  Result Date: 01/17/2020 CLINICAL DATA:  Confusion and slurred speech which is now resolved EXAM: MRI HEAD WITHOUT CONTRAST TECHNIQUE: Multiplanar, multiecho pulse sequences of the brain and surrounding structures were obtained without intravenous  contrast. COMPARISON:  Head CT from yesterday FINDINGS: Brain: Subcentimeter areas of weakly restricted diffusion in the right internal capsule, lateral left putamen, and bilateral centrum semiovale. Brain volume is normal. Background of extensive chronic small vessel ischemia with confluent FLAIR hyperintensity. No hydrocephalus, masslike finding, or extra-axial collection. Few remote microhemorrhages. Remote lacunar infarcts in the bilateral cerebral white matter, right pons, and right thalamus. Vascular: Preserved flow voids. Skull and upper cervical spine: Normal marrow signal Sinuses/Orbits: Bilateral cataract resection and left intra-ocular collection with possible oil tamponade. There is history of left eye blindness. IMPRESSION: 1. Subacute small vessel infarcts in the bilateral subcortical brain. 2. Extensive chronic small vessel ischemia. Electronically Signed   By: Monte Fantasia M.D.   On: 01/17/2020 07:33   US Carotid Bilateral (at [facility] and [facility] only)  Result Date: 01/17/2020 CLINICAL DATA:  86 year old female with a history of stroke EXAM: BILATERAL CAROTID DUPLEX ULTRASOUND TECHNIQUE: Pearline Cables scale imaging, color Doppler and duplex ultrasound were performed of bilateral carotid and vertebral arteries in the neck. COMPARISON:  None. FINDINGS: Criteria: Quantification of carotid stenosis is based on velocity parameters that correlate the residual internal carotid diameter with NASCET-based stenosis levels, using the diameter of the distal  internal carotid lumen as the denominator for stenosis measurement. The following velocity measurements were obtained: RIGHT ICA:  Systolic 61 cm/sec, Diastolic 11 cm/sec CCA:  60 cm/sec SYSTOLIC ICA/CCA RATIO:  1.0 ECA:  64 cm/sec LEFT ICA:  Systolic 82 cm/sec, Diastolic 17 cm/sec CCA:  65 cm/sec SYSTOLIC ICA/CCA RATIO:  1.3 ECA:  50 cm/sec Right Brachial SBP: Not acquired Left Brachial SBP: Not acquired RIGHT CAROTID ARTERY: No significant calcifications of the right  common carotid artery. Intermediate waveform maintained. Heterogeneous and partially calcified plaque at the right carotid bifurcation. No significant lumen shadowing. Low resistance waveform of the right ICA. Tortuosity RIGHT VERTEBRAL ARTERY: Antegrade flow with low resistance waveform. LEFT CAROTID ARTERY: No significant calcifications of the left common carotid artery. Intermediate waveform maintained. Heterogeneous and partially calcified plaque at the left carotid bifurcation without significant lumen shadowing. Low resistance waveform of the left ICA. Tortuosity LEFT VERTEBRAL ARTERY:  Antegrade flow with low resistance waveform. IMPRESSION: Color duplex indicates minimal heterogeneous and calcified plaque, with no hemodynamically significant stenosis by duplex criteria in the extracranial cerebrovascular circulation. Signed, Dulcy Fanny. Dellia Nims, RPVI [facility] Electronically Signed   By: Corrie Mckusick D.O.   On: 01/17/2020 11:16   ECHOCARDIOGRAM COMPLETE  Result Date: 01/18/2020    ECHOCARDIOGRAM REPORT   Patient Name:   METZLI POLLICK Date of Exam: 01/18/2020 Medical Rec #:  [id number]    Height:       62.0 in Accession #:    [id number]   Weight:       105.0 lb Date of Birth:  08-Jul-1932     BSA:          1.454 m Patient Age:    29 years     BP:           121/74 mmHg Patient Gender: F            HR:           74 bpm. Exam Location:  [facility] Procedure: 2D Echo, Color Doppler and Cardiac Doppler Indications:     Stroke 434.91  History:         Patient has prior history of Echocardiogram examinations, most                  recent 08/03/2016. Stroke; Risk Factors:Hypertension.  Sonographer:     Sherrie Sport RDCS (AE) Referring Phys:  [id number] [facility] Chakita Mcgraw Diagnosing Phys: Nelva Bush MD IMPRESSIONS  1. Left ventricular ejection fraction, by estimation, is 65 to 70%. The left ventricle has normal function. The left ventricle has no regional wall motion  abnormalities. There is severe asymmetric left ventricular hypertrophy of the septal segment. Left  ventricular diastolic parameters are consistent with Grade I diastolic dysfunction (impaired relaxation).  2. Right ventricular systolic function is normal. The right ventricular size is normal. Moderately increased right ventricular wall thickness.  3. The mitral valve is grossly normal. Trivial mitral valve regurgitation. No evidence of mitral stenosis.  4. The aortic valve is tricuspid. Aortic valve regurgitation is mild. No aortic stenosis is present.  5. Mildly dilated pulmonary artery. FINDINGS  Left Ventricle: Left ventricular ejection fraction, by estimation, is 65 to 70%. The left ventricle has normal function. The left ventricle has no regional wall motion abnormalities. The left ventricular internal cavity size was normal in size. There is  severe asymmetric left ventricular hypertrophy of the septal segment. Left ventricular diastolic parameters are consistent with Grade I diastolic dysfunction (  impaired relaxation). Right Ventricle: The right ventricular size is normal. Moderately increased right ventricular wall thickness. Right ventricular systolic function is normal. Left Atrium: Left atrial size was normal in size. Right Atrium: Right atrial size was normal in size. Pericardium: A small pericardial effusion is present. The pericardial effusion is posterior and lateral to the left ventricle. Mitral Valve: The mitral valve is grossly normal. There is mild thickening of the mitral valve leaflet(s). Trivial mitral valve regurgitation. No evidence of mitral valve stenosis. Tricuspid Valve: The tricuspid valve is grossly normal. Tricuspid valve regurgitation is trivial. Aortic Valve: The aortic valve is tricuspid. Aortic valve regurgitation is mild. No aortic stenosis is present. Aortic valve mean gradient measures 4.0 mmHg. Aortic valve peak gradient measures 6.4 mmHg. Aortic valve area, by VTI measures  3.18 cm. Pulmonic Valve: The pulmonic valve was normal in structure. Pulmonic valve regurgitation is trivial. No evidence of pulmonic stenosis. Aorta: The aortic root is normal in size and structure. Pulmonary Artery: The pulmonary artery is mildly dilated. Venous: The inferior vena cava was not well visualized. IAS/Shunts: The interatrial septum was not well visualized.  LEFT VENTRICLE PLAX 2D LVIDd:         2.63 cm  Diastology LVIDs:         1.42 cm  LV e' lateral:   5.11 cm/s LV PW:         1.20 cm  LV E/e' lateral: 8.6 LV IVS:        1.08 cm  LV e' medial:    3.37 cm/s LVOT diam:     2.00 cm  LV E/e' medial:  13.1 LV SV:         95 LV SV Index:   65 LVOT Area:     3.14 cm  RIGHT VENTRICLE RV Basal diam:  2.71 cm RV S prime:     14.70 cm/s TAPSE (M-mode): 3.0 cm LEFT ATRIUM             Index      RIGHT ATRIUM           Index LA diam:        1.90 cm 1.31 cm/m RA Area:     12.30 cm LA Vol (A2C):   8.4 ml  5.79 ml/m RA Volume:   26.40 ml  18.16 ml/m LA Vol (A4C):   12.9 ml 8.87 ml/m LA Biplane Vol: 10.5 ml 7.22 ml/m  AORTIC VALVE                   PULMONIC VALVE AV Area (Vmax):    3.14 cm    PV Vmax:        0.60 m/s AV Area (Vmean):   2.81 cm    PV Peak grad:   1.4 mmHg AV Area (VTI):     3.18 cm    RVOT Peak grad: 2 mmHg AV Vmax:           126.00 cm/s AV Vmean:          95.300 cm/s AV VTI:            0.299 m AV Peak Grad:      6.4 mmHg AV Mean Grad:      4.0 mmHg LVOT Vmax:         126.00 cm/s LVOT Vmean:        85.100 cm/s LVOT VTI:          0.302 m LVOT/AV VTI ratio: 1.01  AORTA Ao  Root diam: 2.80 cm MITRAL VALVE               TRICUSPID VALVE MV Area (PHT): 2.57 cm    TR Peak grad:   29.4 mmHg MV Decel Time: 295 msec    TR Vmax:        271.00 cm/s MV E velocity: 44.10 cm/s MV A velocity: 73.30 cm/s  SHUNTS MV E/A ratio:  0.60        Systemic VTI:  0.30 m                            Systemic Diam: 2.00 cm Nelva Bush MD Electronically signed by Nelva Bush MD Signature Date/Time:  01/18/2020/10:17:36 AM    Final       Subjective: Patient seen and examined.  Neurology at bedside too.  Patient has no complaints.  Denies dizziness, chest pain, shortness of breath.  Discharge Exam: Vitals:   01/18/20 0856 01/18/20 1156  BP: (!) 155/78 (!) 176/94  Pulse: 60 66  Resp: 16 18  Temp: 97.6 F (36.4 C) 98.2 F (36.8 C)  SpO2: 100% 98%   Vitals:   01/18/20 0023 01/18/20 0447 01/18/20 0856 01/18/20 1156  BP: 130/81 121/74 (!) 155/78 (!) 176/94  Pulse: 72 74 60 66  Resp: 17 18 16 18   Temp: 98 F (36.7 C) 98.2 F (36.8 C) 97.6 F (36.4 C) 98.2 F (36.8 C)  TempSrc: Oral Oral    SpO2: 98% 97% 100% 98%  Weight:      Height:        General: Pt is alert, awake, not in acute distress Cardiovascular: RRR, S1/S2 +, no rubs, no gallops Respiratory: CTA bilaterally, no wheezing, no rhonchi Abdominal: Soft, NT, ND, bowel sounds + Extremities: no edema, no cyanosis 5/5 motor strength x4    The results of significant diagnostics from this hospitalization (including imaging, microbiology, ancillary and laboratory) are listed below for reference.     Microbiology: Recent Results (from the past 240 hour(s))  SARS Coronavirus 2 by RT PCR (hospital order, performed in [facility] hospital lab) Nasopharyngeal Nasopharyngeal Swab     Status: None   Collection Time: 01/17/20  3:37 AM   Specimen: Nasopharyngeal Swab  Result Value Ref Range Status   SARS Coronavirus 2 NEGATIVE NEGATIVE Final    Comment: (NOTE) SARS-CoV-2 target nucleic acids are NOT DETECTED.  The SARS-CoV-2 RNA is generally detectable in upper and lower respiratory specimens during the acute phase of infection. The lowest concentration of SARS-CoV-2 viral copies this assay can detect is 250 copies / mL. A negative result does not preclude SARS-CoV-2 infection and should not be used as the sole basis for treatment or other patient management decisions.  A negative result may occur with improper  specimen collection / handling, submission of specimen other than nasopharyngeal swab, presence of viral mutation(s) within the areas targeted by this assay, and inadequate number of viral copies (<250 copies / mL). A negative result must be combined with clinical observations, patient history, and epidemiological information.  Fact Sheet for Patients:   StrictlyIdeas.no  Fact Sheet for Healthcare Providers: BankingDealers.co.za  This test is not yet approved or  cleared by the [place] FDA and has been authorized for detection and/or diagnosis of SARS-CoV-2 by FDA under an Emergency Use Authorization (EUA).  This EUA will remain in effect (meaning this test can be used) for the duration of the COVID-19 declaration under Section  564(b)(1) of the Act, 21 U.S.C. section 360bbb-3(b)(1), unless the authorization is terminated or revoked sooner.  Performed at [facility], [address] [place], [place] [address]      Labs: BNP (last 3 results) No results for input(s): BNP in the last 8760 hours. Basic Metabolic Panel: Recent Labs  Lab 01/16/20 2206 01/17/20 0747 01/18/20 0432  NA 139  --   --   K 3.2*  --  4.6  CL 100  --   --   CO2 30  --   --   GLUCOSE 100*  --   --   BUN 21  --   --   CREATININE 0.56 0.48  --   CALCIUM 9.2  --   --    Liver Function Tests: Recent Labs  Lab 01/16/20 2206  AST 23  ALT 15  ALKPHOS 35*  BILITOT 1.6*  PROT 7.7  ALBUMIN 3.8   No results for input(s): LIPASE, AMYLASE in the last 168 hours. No results for input(s): AMMONIA in the last 168 hours. CBC: Recent Labs  Lab 01/16/20 2206 01/18/20 0432  WBC 5.0 4.0  HGB 12.2 11.6*  HCT 37.6 35.0*  MCV 92.4 91.4  PLT 162 150   Cardiac Enzymes: No results for input(s): CKTOTAL, CKMB, CKMBINDEX, TROPONINI in the last 168 hours. BNP: Invalid input(s): POCBNP CBG: No results for input(s): GLUCAP in the last 168  hours. D-Dimer No results for input(s): DDIMER in the last 72 hours. Hgb A1c No results for input(s): HGBA1C in the last 72 hours. Lipid Profile Recent Labs    01/18/20 0432  CHOL 210*  HDL 45  LDLCALC 151*  TRIG 72  CHOLHDL 4.7   Thyroid function studies No results for input(s): TSH, T4TOTAL, T3FREE, THYROIDAB in the last 72 hours.  Invalid input(s): FREET3 Anemia work up No results for input(s): VITAMINB12, FOLATE, FERRITIN, TIBC, IRON, RETICCTPCT in the last 72 hours. Urinalysis    Component Value Date/Time   COLORURINE YELLOW (A) 01/17/2020 0337   APPEARANCEUR HAZY (A) 01/17/2020 0337   APPEARANCEUR Clear 07/13/2014 1205   LABSPEC 1.017 01/17/2020 0337   LABSPEC 1.005 07/13/2014 1205   PHURINE 5.0 01/17/2020 0337   GLUCOSEU NEGATIVE 01/17/2020 0337   GLUCOSEU Negative 07/13/2014 1205   HGBUR NEGATIVE 01/17/2020 0337   BILIRUBINUR NEGATIVE 01/17/2020 0337   BILIRUBINUR Negative 07/13/2014 [place] 01/17/2020 0337   PROTEINUR NEGATIVE 01/17/2020 0337   NITRITE NEGATIVE 01/17/2020 0337   LEUKOCYTESUR SMALL (A) 01/17/2020 0337   LEUKOCYTESUR Negative 07/13/2014 1205   Sepsis Labs Invalid input(s): PROCALCITONIN,  WBC,  LACTICIDVEN Microbiology Recent Results (from the past 240 hour(s))  SARS Coronavirus 2 by RT PCR (hospital order, performed in [place] hospital lab) Nasopharyngeal Nasopharyngeal Swab     Status: None   Collection Time: 01/17/20  3:37 AM   Specimen: Nasopharyngeal Swab  Result Value Ref Range Status   SARS Coronavirus 2 NEGATIVE NEGATIVE Final    Comment: (NOTE) SARS-CoV-2 target nucleic acids are NOT DETECTED.  The SARS-CoV-2 RNA is generally detectable in upper and lower respiratory specimens during the acute phase of infection. The lowest concentration of SARS-CoV-2 viral copies this assay can detect is 250 copies / mL. A negative result does not preclude SARS-CoV-2 infection and should not be used as the sole basis for  treatment or other patient management decisions.  A negative result may occur with improper specimen collection / handling, submission of specimen other than nasopharyngeal swab, presence of  viral mutation(s) within the areas targeted by this assay, and inadequate number of viral copies (<250 copies / mL). A negative result must be combined with clinical observations, patient history, and epidemiological information.  Fact Sheet for Patients:   StrictlyIdeas.no  Fact Sheet for Healthcare Providers: BankingDealers.co.za  This test is not yet approved or  cleared by the [place] FDA and has been authorized for detection and/or diagnosis of SARS-CoV-2 by FDA under an Emergency Use Authorization (EUA).  This EUA will remain in effect (meaning this test can be used) for the duration of the COVID-19 declaration under Section 564(b)(1) of the Act, 21 U.S.C. section 360bbb-3(b)(1), unless the authorization is terminated or revoked sooner.  Performed at [facility], [address], [place],  [address]      Time coordinating discharge: Over 30 minutes  SIGNED:   Nolberto Hanlon, MD  [facility] 01/18/2020, 12:38 PM Pager   If 7PM-7AM, please contact night-coverage [email or web address] Password [email or web address]

## 2020-01-19 ENCOUNTER — Telehealth: Payer: Self-pay

## 2020-01-19 LAB — HEMOGLOBIN A1C
Hgb A1c MFr Bld: 5.1 % (ref 4.8–5.6)
Mean Plasma Glucose: 100 mg/dL

## 2020-01-26 ENCOUNTER — Ambulatory Visit: Payer: Medicare Other | Admitting: Physician Assistant

## 2020-01-26 NOTE — Telephone Encounter (Signed)
Agree with RN recommendations.  This is a new patient and needs to be scheduled with an MD to establish care and to make further interpretation regarding possible repeat external monitor versus loop recorder.

## 2020-01-26 NOTE — Telephone Encounter (Signed)
Monitor (ZIO AT) placed in hospital last week on 01/18/20 due to stroke. The patient's daughter was present at the time the monitor was placed. The patient does have advanced dementia, but was pleasant at the time her monitor was placed. The patient's daughter stated that the patient lives with her brother, but she is close by and visits every day.   Lovena Le, from scheduling, spoke with me and I advised that the patient's daughter send her monitor back.  I do not think she will be able to keep an external monitor on.  If the info on the ZIO that can be obtained was unrevealing and there are still concerns for the source of her stroke, I don't know if a LINQ would be a better option for her.  Her dementia may make that more complicated.   She has an appointment on 02/22/20 with Christell Faith, PA.  Will forward to [place] as an [place].

## 2020-01-26 NOTE — Telephone Encounter (Signed)
Patients daughter calling in to reschedule hosp fu. Daughter states that patient has sever dementia and that she would not keep the monitor on at all. States she would pull it off and hide it. Daughter was instructed to mail the monitor back and that we would move up the follow up to 8/3.

## 2020-01-26 NOTE — Telephone Encounter (Signed)
Noted- will forward to scheduling team to please r/s the patient's appointment with an MD.

## 2020-01-26 NOTE — Telephone Encounter (Signed)
Called and left voicemail with Daughter to reschedule with a Doctor rather than an APP

## 2020-02-04 ENCOUNTER — Telehealth: Payer: Self-pay

## 2020-02-04 NOTE — Telephone Encounter (Signed)
Call to patient to review heart monitor results.    Spoke to daughter Eyvonne Mechanic, okay per DPR, she verbalized understanding and has no further questions at this time.    Advised pt to call for any further questions or concerns.  No further orders.

## 2020-02-04 NOTE — Telephone Encounter (Signed)
-----   Message from Rise Mu, PA-C sent at 02/04/2020 12:33 PM EDT ----- Please call patient/patient's daughter. Monitor showed sinus rhythm with an average rate of 65 bpm.  For short runs of SVT (fast rhythm that comes from the top portion of the heart) with the longest lasting 11 beats.  No evidence of A. fib.  Rare extra beats from the top and bottom portions of the heart.  No evidence of A. fib/arrhythmia at this time.  Follow-up with MD as directed.

## 2020-02-22 ENCOUNTER — Other Ambulatory Visit: Payer: Self-pay

## 2020-02-22 ENCOUNTER — Ambulatory Visit: Payer: Medicare Other | Admitting: Physician Assistant

## 2020-02-22 ENCOUNTER — Ambulatory Visit (INDEPENDENT_AMBULATORY_CARE_PROVIDER_SITE_OTHER): Payer: Medicare Other | Admitting: Cardiology

## 2020-02-22 ENCOUNTER — Encounter: Payer: Self-pay | Admitting: Cardiology

## 2020-02-22 VITALS — BP 120/78 | HR 63 | Ht 60.0 in | Wt 105.0 lb

## 2020-02-22 DIAGNOSIS — I639 Cerebral infarction, unspecified: Secondary | ICD-10-CM | POA: Diagnosis not present

## 2020-02-22 DIAGNOSIS — I10 Essential (primary) hypertension: Secondary | ICD-10-CM | POA: Diagnosis not present

## 2020-02-22 NOTE — Progress Notes (Signed)
Cardiology Office Note:    Date:  02/22/2020   ID:  Karen Krueger, DOB April 26, 1932, MRN [id number]  PCP:  Idelle Crouch, MD  [facility] Cardiologist:  Kate Sable, MD  [place] Electrophysiologist:  None   Referring MD: Idelle Crouch, MD   Chief Complaint  Patient presents with  . New Patient (Initial Visit)    Hospital F/U-s/p TIA; Meds verbally reviewed with patient's daughter.    History of Present Illness:   History obtained from daughter due to condition of patient.  Karen Krueger is a 86 y.o. female with a hx of hypertension, dementia, blindness, knee arthritis, wheelchair-bound CVA who presents due to recent CVA.  Patient was seen in the hospital on 01/14/2019 due to slurred speech.  MRI showed subacute small vessel.  Cardiac monitor was placed which patient wore for 3 days 10 hours, patient kept removing monitor according to daughter.  Monitor did not reveal any significant arrhythmias such as A. fib or flutter.  Patient placed on aspirin Plavix for 3 weeks recommendations by neurology to switch to aspirin 81 mg after.  Echocardiogram showed normal systolic function, EF 65 to 70%, impaired relaxation.  Past Medical History:  Diagnosis Date  . Blind left eye   . Cancer ([place])    previous skin cancer   . Dementia ([place])   . Glaucoma    blind in Left eye   . Hypertension    controlled with medication   . Stroke [facility])    two previous strokes, 2005, 2011     Past Surgical History:  Procedure Laterality Date  . ABDOMINAL HYSTERECTOMY    . APPENDECTOMY    . AQUEOUS SHUNT Right 05/06/2016   Procedure: AQUEOUS SHUNT;  Surgeon: Ronnell Freshwater, MD;  Location: [place];  Service: Ophthalmology;  Laterality: Right;  SCLERAL PATCH GRAFT AHMED FP7  . EYE SURGERY     glucacoma numerous times     Current Medications: Current Meds  Medication Sig  . aspirin EC 81 MG EC tablet Take 1 tablet (81 mg total) by mouth daily. Swallow whole.  Marland Kitchen  atorvastatin (LIPITOR) 40 MG tablet Take 1 tablet (40 mg total) by mouth daily.  . clopidogrel (PLAVIX) 75 MG tablet Take 75 mg by mouth daily.  Marland Kitchen latanoprost (XALATAN) 0.005 % ophthalmic solution Place 1 drop into the left eye at bedtime.   . timolol (TIMOPTIC) 0.5 % ophthalmic solution Place 1 drop into the right eye 2 (two) times daily.  . verapamil (CALAN-SR) 120 MG CR tablet Take 120 mg by mouth daily.      Allergies:   Codeine, Sulfa antibiotics, and Penicillins   Social History   Socioeconomic History  . Marital status: Widowed    Spouse name: Not on file  . Number of children: Not on file  . Years of education: Not on file  . Highest education level: Not on file  Occupational History  . Not on file  Tobacco Use  . Smoking status: Former Research scientist (life sciences)  . Smokeless tobacco: Never Used  . Tobacco comment: quit over 45 yrs ago  Vaping Use  . Vaping Use: Never used  Substance and Sexual Activity  . Alcohol use: No  . Drug use: No  . Sexual activity: Not on file  Other Topics Concern  . Not on file  Social History Narrative  . Not on file   Social Determinants of Health   Financial Resource Strain:   . Difficulty of Paying Living  Expenses:   Food Insecurity:   . Worried About Charity fundraiser in the Last Year:   . Arboriculturist in the Last Year:   Transportation Needs:   . Film/video editor (Medical):   Marland Kitchen Lack of Transportation (Non-Medical):   Physical Activity:   . Days of Exercise per Week:   . Minutes of Exercise per Session:   Stress:   . Feeling of Stress :   Social Connections:   . Frequency of Communication with Friends and Family:   . Frequency of Social Gatherings with Friends and Family:   . Attends Religious Services:   . Active Member of Clubs or Organizations:   . Attends Archivist Meetings:   Marland Kitchen Marital Status:      Family History: The patient's family history includes Heart disease in her father; Stroke in her daughter.  ROS:    Please see the history of present illness.     All other systems reviewed and are negative.  EKGs/Labs/Other Studies Reviewed:    The following studies were reviewed today:   EKG:  EKG is  ordered today.  The ekg ordered today demonstrates normal sinus rhythm  Recent Labs: 01/16/2020: ALT 15; BUN 21; Sodium 139 01/17/2020: Creatinine, Ser 0.48 01/18/2020: Hemoglobin 11.6; Platelets 150; Potassium 4.6  Recent Lipid Panel    Component Value Date/Time   CHOL 210 (H) 01/18/2020 0432   CHOL 158 07/14/2014 0446   TRIG 72 01/18/2020 0432   TRIG 95 07/14/2014 0446   HDL 45 01/18/2020 0432   HDL 50 07/14/2014 0446   CHOLHDL 4.7 01/18/2020 0432   VLDL 14 01/18/2020 0432   VLDL 19 07/14/2014 0446   LDLCALC 151 (H) 01/18/2020 0432   LDLCALC 89 07/14/2014 0446    Physical Exam:    VS:  BP 120/78 (BP Location: Left Arm, Patient Position: Sitting, Cuff Size: Normal)   Pulse 63   Ht 5' (1.524 m)   Wt 105 lb (47.6 kg) Comment: Unable to stand on scale-estimated by patient's daughter  SpO2 96%   BMI 20.51 kg/m     Wt Readings from Last 3 Encounters:  02/22/20 105 lb (47.6 kg)  01/16/20 105 lb (47.6 kg)  03/06/19 110 lb (49.9 kg)     GEN:  Well nourished, soft-spoken, sitting in wheelchair HEENT: Normal NECK: No JVD; No carotid bruits LYMPHATICS: No lymphadenopathy CARDIAC: RRR, no murmurs, rubs, gallops RESPIRATORY:  Clear to auscultation without rales, wheezing or rhonchi  ABDOMEN: Soft, non-tender, non-distended MUSCULOSKELETAL: Left knee looks arthritic SKIN: Warm and dry NEUROLOGIC:  Alert , nonverbal PSYCHIATRIC: Nonverbal  ASSESSMENT:    1. Cerebrovascular accident (CVA), unspecified mechanism ([place])   2. Essential hypertension    PLAN:    In order of problems listed above:  1. Patient with history of strokes and TIAs, recent cardiac monitor removed by patient, will monitor for 3 days, did not show any evidence for A. fib or flutter.  Due to patient's condition  including blindness, dementia, wheelchair-bound, she is high fall risk.  As such, will recommend against placing any cardiac monitor, loop recorder as patient will not be a candidate for full anticoagulation.  Continue aspirin, Plavix, statin as prescribed. 2. History of hypertension, on verapamil.  BP controlled.  Follow-up as needed  Total encounter time 60 minutes  Greater than 50% was spent in counseling and coordination of care with the patient   This note was generated in part or whole with voice recognition software.  Voice recognition is usually quite accurate but there are transcription errors that can and very often do occur. I apologize for any typographical errors that were not detected and corrected.  Medication Adjustments/Labs and Tests Ordered: Current medicines are reviewed at length with the patient today.  Concerns regarding medicines are outlined above.  Orders Placed This Encounter  Procedures  . EKG 12-Lead   No orders of the defined types were placed in this encounter.   Patient Instructions  Medication Instructions:  Your physician recommends that you continue on your current medications as directed. Please refer to the Current Medication list given to you today.  *If you need a refill on your cardiac medications before your next appointment, please call your pharmacy*   Lab Work: None Ordered If you have labs (blood work) drawn today and your tests are completely normal, you will receive your results only by: Marland Kitchen MyChart Message (if you have MyChart) OR . A paper copy in the mail If you have any lab test that is abnormal or we need to change your treatment, we will call you to review the results.   Testing/Procedures: None Ordered   Follow-Up: At [facility], you and your health needs are our priority.  As part of our continuing mission to provide you with exceptional heart care, we have created designated Provider Care Teams.  These Care Teams include  your primary Cardiologist (physician) and Advanced Practice Providers (APPs -  Physician Assistants and Nurse Practitioners) who all work together to provide you with the care you need, when you need it.  We recommend signing up for the patient portal called "MyChart".  Sign up information is provided on this After Visit Summary.  MyChart is used to connect with patients for Virtual Visits (Telemedicine).  Patients are able to view lab/test results, encounter notes, upcoming appointments, etc.  Non-urgent messages can be sent to your provider as well.   To learn more about what you can do with MyChart, go to [email or web address].    Your next appointment:   Folllow up as needed   The format for your next appointment:   In Person  Provider:   Kate Sable, MD   Other Instructions     Signed, Kate Sable, MD  02/22/2020 10:29 AM    [place]

## 2020-02-22 NOTE — Patient Instructions (Signed)
Medication Instructions:  Your physician recommends that you continue on your current medications as directed. Please refer to the Current Medication list given to you today.  *If you need a refill on your cardiac medications before your next appointment, please call your pharmacy*   Lab Work: None Ordered If you have labs (blood work) drawn today and your tests are completely normal, you will receive your results only by: Marland Kitchen MyChart Message (if you have MyChart) OR . A paper copy in the mail If you have any lab test that is abnormal or we need to change your treatment, we will call you to review the results.   Testing/Procedures: None Ordered   Follow-Up: At [facility], you and your health needs are our priority.  As part of our continuing mission to provide you with exceptional heart care, we have created designated Provider Care Teams.  These Care Teams include your primary Cardiologist (physician) and Advanced Practice Providers (APPs -  Physician Assistants and Nurse Practitioners) who all work together to provide you with the care you need, when you need it.  We recommend signing up for the patient portal called "MyChart".  Sign up information is provided on this After Visit Summary.  MyChart is used to connect with patients for Virtual Visits (Telemedicine).  Patients are able to view lab/test results, encounter notes, upcoming appointments, etc.  Non-urgent messages can be sent to your provider as well.   To learn more about what you can do with MyChart, go to [email or web address].    Your next appointment:   Folllow up as needed   The format for your next appointment:   In Person  Provider:   Kate Sable, MD   Other Instructions

## 2020-10-12 ENCOUNTER — Telehealth: Payer: Self-pay | Admitting: Nurse Practitioner

## 2020-10-12 NOTE — Telephone Encounter (Signed)
Called patient's daughter, [name] to schedule Palliative Consult, no answer - left message with reason for call along with my name and call back number.   I then called patient's home number and spoke with patient's son Remo Lipps and he was under the impression that Hospice was coming out and he said that he wanted to talk with [name] about this first.  I told Remo Lipps that I had called and left a message on Sylvia's number (I also verified with Remo Lipps that I had the right number for [name] and I did).  I told him that once they discussed this then someone could call me back and he agreed with this

## 2020-10-13 ENCOUNTER — Telehealth: Payer: Self-pay | Admitting: Nurse Practitioner

## 2020-10-13 NOTE — Telephone Encounter (Signed)
Returned call to daughter, [name], and discussed Palliative referral/services with her and all questions were answered and she was in agreement with scheduling.  I have scheduled an In-home Palliative Consult for 10/26/20 @ 11:00 AM

## 2020-10-17 ENCOUNTER — Telehealth: Payer: Self-pay | Admitting: Nurse Practitioner

## 2020-10-17 NOTE — Telephone Encounter (Signed)
Rec'd a message from the daughter, [name], asking me to cancel the Palliative Consult for 10/26/20.  Called daughter back and left a message, to clarify if they wanted to reschedule the appointment or if they wanted to reschedule.  Left my name and call back number requesting a return call.

## 2020-10-17 NOTE — Telephone Encounter (Signed)
Rec'd return call from daughter, [name], and they wanted to cancel the Palliative referral at this time.  I will cancel and notify PCP and Palliative Team.

## 2020-10-26 ENCOUNTER — Other Ambulatory Visit: Payer: Medicare Other | Admitting: Nurse Practitioner

## 2021-02-04 ENCOUNTER — Other Ambulatory Visit: Payer: Self-pay

## 2021-02-04 ENCOUNTER — Emergency Department: Payer: Medicare Other

## 2021-02-04 ENCOUNTER — Inpatient Hospital Stay
Admission: EM | Admit: 2021-02-04 | Discharge: 2021-02-15 | DRG: 177 | Disposition: A | Discharge status: Skilled Nursing Facility | Payer: Medicare Other | Attending: Internal Medicine | Admitting: Internal Medicine

## 2021-02-04 DIAGNOSIS — R0902 Hypoxemia: Secondary | ICD-10-CM

## 2021-02-04 DIAGNOSIS — J9601 Acute respiratory failure with hypoxia: Secondary | ICD-10-CM | POA: Diagnosis present

## 2021-02-04 DIAGNOSIS — Z7902 Long term (current) use of antithrombotics/antiplatelets: Secondary | ICD-10-CM | POA: Diagnosis not present

## 2021-02-04 DIAGNOSIS — R102 Pelvic and perineal pain: Secondary | ICD-10-CM | POA: Diagnosis present

## 2021-02-04 DIAGNOSIS — H409 Unspecified glaucoma: Secondary | ICD-10-CM | POA: Diagnosis present

## 2021-02-04 DIAGNOSIS — U071 COVID-19: Principal | ICD-10-CM | POA: Diagnosis present

## 2021-02-04 DIAGNOSIS — Z882 Allergy status to sulfonamides status: Secondary | ICD-10-CM | POA: Diagnosis not present

## 2021-02-04 DIAGNOSIS — R531 Weakness: Secondary | ICD-10-CM

## 2021-02-04 DIAGNOSIS — F0390 Unspecified dementia without behavioral disturbance: Secondary | ICD-10-CM | POA: Diagnosis present

## 2021-02-04 DIAGNOSIS — Z885 Allergy status to narcotic agent status: Secondary | ICD-10-CM

## 2021-02-04 DIAGNOSIS — J1282 Pneumonia due to coronavirus disease 2019: Secondary | ICD-10-CM | POA: Diagnosis present

## 2021-02-04 DIAGNOSIS — E785 Hyperlipidemia, unspecified: Secondary | ICD-10-CM | POA: Diagnosis present

## 2021-02-04 DIAGNOSIS — Z8249 Family history of ischemic heart disease and other diseases of the circulatory system: Secondary | ICD-10-CM

## 2021-02-04 DIAGNOSIS — Z823 Family history of stroke: Secondary | ICD-10-CM | POA: Diagnosis not present

## 2021-02-04 DIAGNOSIS — Z85828 Personal history of other malignant neoplasm of skin: Secondary | ICD-10-CM

## 2021-02-04 DIAGNOSIS — I10 Essential (primary) hypertension: Secondary | ICD-10-CM | POA: Diagnosis present

## 2021-02-04 DIAGNOSIS — R52 Pain, unspecified: Secondary | ICD-10-CM

## 2021-02-04 DIAGNOSIS — H5462 Unqualified visual loss, left eye, normal vision right eye: Secondary | ICD-10-CM | POA: Diagnosis present

## 2021-02-04 DIAGNOSIS — Z79899 Other long term (current) drug therapy: Secondary | ICD-10-CM | POA: Diagnosis not present

## 2021-02-04 DIAGNOSIS — Z880 Allergy status to penicillin: Secondary | ICD-10-CM | POA: Diagnosis not present

## 2021-02-04 DIAGNOSIS — R509 Fever, unspecified: Secondary | ICD-10-CM

## 2021-02-04 DIAGNOSIS — Z7982 Long term (current) use of aspirin: Secondary | ICD-10-CM

## 2021-02-04 DIAGNOSIS — E876 Hypokalemia: Secondary | ICD-10-CM | POA: Diagnosis present

## 2021-02-04 DIAGNOSIS — Z8673 Personal history of transient ischemic attack (TIA), and cerebral infarction without residual deficits: Secondary | ICD-10-CM | POA: Diagnosis not present

## 2021-02-04 DIAGNOSIS — Z66 Do not resuscitate: Secondary | ICD-10-CM | POA: Diagnosis present

## 2021-02-04 DIAGNOSIS — Z87891 Personal history of nicotine dependence: Secondary | ICD-10-CM

## 2021-02-04 DIAGNOSIS — A419 Sepsis, unspecified organism: Secondary | ICD-10-CM | POA: Diagnosis not present

## 2021-02-04 LAB — CBC WITH DIFFERENTIAL/PLATELET
Abs Immature Granulocytes: 0.02 10*3/uL (ref 0.00–0.07)
Basophils Absolute: 0 10*3/uL (ref 0.0–0.1)
Basophils Relative: 0 %
Eosinophils Absolute: 0 10*3/uL (ref 0.0–0.5)
Eosinophils Relative: 1 %
HCT: 36.7 % (ref 36.0–46.0)
Hemoglobin: 11.9 g/dL — ABNORMAL LOW (ref 12.0–15.0)
Immature Granulocytes: 1 %
Lymphocytes Relative: 31 %
Lymphs Abs: 1.3 10*3/uL (ref 0.7–4.0)
MCH: 30.1 pg (ref 26.0–34.0)
MCHC: 32.4 g/dL (ref 30.0–36.0)
MCV: 92.7 fL (ref 80.0–100.0)
Monocytes Absolute: 1.5 10*3/uL — ABNORMAL HIGH (ref 0.1–1.0)
Monocytes Relative: 33 %
Neutro Abs: 1.5 10*3/uL — ABNORMAL LOW (ref 1.7–7.7)
Neutrophils Relative %: 34 %
Platelets: 143 10*3/uL — ABNORMAL LOW (ref 150–400)
RBC: 3.96 MIL/uL (ref 3.87–5.11)
RDW: 14.6 % (ref 11.5–15.5)
WBC: 4.4 10*3/uL (ref 4.0–10.5)
nRBC: 0 % (ref 0.0–0.2)

## 2021-02-04 LAB — COMPREHENSIVE METABOLIC PANEL
ALT: 19 U/L (ref 0–44)
AST: 35 U/L (ref 15–41)
Albumin: 3.4 g/dL — ABNORMAL LOW (ref 3.5–5.0)
Alkaline Phosphatase: 45 U/L (ref 38–126)
Anion gap: 4 — ABNORMAL LOW (ref 5–15)
BUN: 22 mg/dL (ref 8–23)
CO2: 29 mmol/L (ref 22–32)
Calcium: 8.6 mg/dL — ABNORMAL LOW (ref 8.9–10.3)
Chloride: 101 mmol/L (ref 98–111)
Creatinine, Ser: 0.42 mg/dL — ABNORMAL LOW (ref 0.44–1.00)
GFR, Estimated: >60 mL/min (ref >60)
Glucose, Bld: 93 mg/dL (ref 70–99)
Potassium: 3.3 mmol/L — ABNORMAL LOW (ref 3.5–5.1)
Sodium: 134 mmol/L — ABNORMAL LOW (ref 135–145)
Total Bilirubin: 0.7 mg/dL (ref 0.3–1.2)
Total Protein: 7.4 g/dL (ref 6.5–8.1)

## 2021-02-04 LAB — LACTIC ACID, PLASMA: Lactic Acid, Venous: 0.9 mmol/L (ref 0.5–1.9)

## 2021-02-04 LAB — D-DIMER, QUANTITATIVE: D-Dimer, Quant: 0.89 ug/mL-FEU — ABNORMAL HIGH (ref 0.00–0.50)

## 2021-02-04 LAB — PROTIME-INR
INR: 1.1 (ref 0.8–1.2)
Prothrombin Time: 14.4 seconds (ref 11.4–15.2)

## 2021-02-04 LAB — PROCALCITONIN: Procalcitonin: <0.1 ng/mL

## 2021-02-04 LAB — BRAIN NATRIURETIC PEPTIDE: B Natriuretic Peptide: 124.2 pg/mL — ABNORMAL HIGH (ref 0.0–100.0)

## 2021-02-04 LAB — C-REACTIVE PROTEIN: CRP: 0.9 mg/dL (ref <1.0)

## 2021-02-04 LAB — MAGNESIUM: Magnesium: 1.8 mg/dL (ref 1.7–2.4)

## 2021-02-04 MED ORDER — ENOXAPARIN SODIUM 40 MG/0.4ML IJ SOSY
40.0000 mg | PREFILLED_SYRINGE | INTRAMUSCULAR | Status: DC
  Administered 2021-02-04 – 2021-02-14 (×11): 40 mg via SUBCUTANEOUS
  Filled 2021-02-04 (×11): qty 0.4

## 2021-02-04 MED ORDER — SODIUM CHLORIDE 0.9 % IV SOLN
100.0000 mg | Freq: Every day | INTRAVENOUS | Status: AC
  Administered 2021-02-05 – 2021-02-08 (×4): 100 mg via INTRAVENOUS
  Filled 2021-02-04 (×3): qty 100
  Filled 2021-02-04: qty 20
  Filled 2021-02-04: qty 100

## 2021-02-04 MED ORDER — VERAPAMIL HCL ER 120 MG PO TBCR
120.0000 mg | EXTENDED_RELEASE_TABLET | Freq: Every day | ORAL | Status: DC
  Administered 2021-02-04 – 2021-02-08 (×5): 120 mg via ORAL
  Filled 2021-02-04 (×5): qty 1

## 2021-02-04 MED ORDER — HYDRALAZINE HCL 20 MG/ML IJ SOLN: 5.0000 mg | INTRAMUSCULAR | Status: DC | PRN

## 2021-02-04 MED ORDER — LATANOPROST 0.005 % OP SOLN
1.0000 [drp] | Freq: Every day | OPHTHALMIC | Status: DC
  Administered 2021-02-04 – 2021-02-14 (×11): 1 [drp] via OPHTHALMIC
  Filled 2021-02-04: qty 2.5

## 2021-02-04 MED ORDER — ACETAMINOPHEN 650 MG RE SUPP: 650.0000 mg | Freq: Four times a day (QID) | RECTAL | Status: DC | PRN

## 2021-02-04 MED ORDER — TIMOLOL MALEATE 0.5 % OP SOLN
1.0000 [drp] | Freq: Two times a day (BID) | OPHTHALMIC | Status: DC
  Administered 2021-02-04 – 2021-02-15 (×22): 1 [drp] via OPHTHALMIC
  Filled 2021-02-04: qty 5

## 2021-02-04 MED ORDER — ACETAMINOPHEN 650 MG RE SUPP
650.0000 mg | Freq: Once | RECTAL | Status: AC
  Administered 2021-02-04: 650 mg via RECTAL
  Filled 2021-02-04: qty 1

## 2021-02-04 MED ORDER — ENSURE ENLIVE PO LIQD
237.0000 mL | Freq: Two times a day (BID) | ORAL | Status: DC
  Administered 2021-02-05 – 2021-02-15 (×15): 237 mL via ORAL

## 2021-02-04 MED ORDER — CLOPIDOGREL BISULFATE 75 MG PO TABS
75.0000 mg | ORAL_TABLET | Freq: Every day | ORAL | Status: DC
  Administered 2021-02-04 – 2021-02-15 (×12): 75 mg via ORAL
  Filled 2021-02-04 (×13): qty 1

## 2021-02-04 MED ORDER — ONDANSETRON HCL 4 MG/2ML IJ SOLN: 4.0000 mg | Freq: Three times a day (TID) | INTRAMUSCULAR | Status: DC | PRN

## 2021-02-04 MED ORDER — ATORVASTATIN CALCIUM 20 MG PO TABS
40.0000 mg | ORAL_TABLET | Freq: Every day | ORAL | Status: DC
  Administered 2021-02-04 – 2021-02-15 (×12): 40 mg via ORAL
  Filled 2021-02-04 (×12): qty 2

## 2021-02-04 MED ORDER — ALBUTEROL SULFATE HFA 108 (90 BASE) MCG/ACT IN AERS
2.0000 | INHALATION_SPRAY | RESPIRATORY_TRACT | Status: DC | PRN
  Filled 2021-02-04: qty 6.7

## 2021-02-04 MED ORDER — SODIUM CHLORIDE 0.9 % IV SOLN
200.0000 mg | Freq: Once | INTRAVENOUS | Status: AC
  Administered 2021-02-04: 200 mg via INTRAVENOUS
  Filled 2021-02-04: qty 40

## 2021-02-04 MED ORDER — DM-GUAIFENESIN ER 30-600 MG PO TB12
1.0000 | ORAL_TABLET | Freq: Two times a day (BID) | ORAL | Status: DC | PRN
  Administered 2021-02-10 – 2021-02-12 (×3): 1 via ORAL
  Filled 2021-02-04 (×3): qty 1

## 2021-02-04 MED ORDER — ACETAMINOPHEN 325 MG PO TABS
650.0000 mg | ORAL_TABLET | Freq: Four times a day (QID) | ORAL | Status: DC | PRN
  Administered 2021-02-07 – 2021-02-13 (×2): 650 mg via ORAL
  Filled 2021-02-04 (×2): qty 2

## 2021-02-04 MED ORDER — METHYLPREDNISOLONE SODIUM SUCC 40 MG IJ SOLR
40.0000 mg | Freq: Two times a day (BID) | INTRAMUSCULAR | Status: DC
  Administered 2021-02-04 – 2021-02-06 (×5): 40 mg via INTRAVENOUS
  Filled 2021-02-04 (×5): qty 1

## 2021-02-04 MED ORDER — POTASSIUM CHLORIDE CRYS ER 20 MEQ PO TBCR
40.0000 meq | EXTENDED_RELEASE_TABLET | Freq: Once | ORAL | Status: AC
  Administered 2021-02-04: 40 meq via ORAL
  Filled 2021-02-04: qty 2

## 2021-02-04 NOTE — ED Provider Notes (Addendum)
[facility] Emergency Department Provider Note  ____________________________________________   Event Date/Time   First MD Initiated Contact with Patient 02/04/21 1156     (approximate)  I have reviewed the triage vital signs and the nursing notes.   HISTORY  Chief Complaint Fever    HPI Karen Krueger is a 87 y.o. female patient living at home with family.  Diagnosed with COVID yesterday fever since yesterday.  Mental status and behavior been about the same.  Patient's daughter has shingles and her husband also has COVID.  Patient is unable to sit up without maximal assistance.  Patient's daughter feels she cannot care for at home under the current situation.  Assess to help take care of her.  Patient desats briefly to 10 -90 while I am trying to move her around in bed while on room air. EMS reported they got 57 and put her on 4 L     Past Medical History:  Diagnosis Date   Blind left eye    Cancer ([place])    previous skin cancer    Dementia ([place])    Glaucoma    blind in Left eye    Hypertension    controlled with medication    Stroke [facility])    two previous strokes, 2005, 2011     Patient Active Problem List   Diagnosis Date Noted   TIA (transient ischemic attack) 01/17/2020   Blindness 01/17/2020   History of CVA (cerebrovascular accident) 01/17/2020   Hyponatremia 08/04/2017   UTI (urinary tract infection) 08/04/2017   Shingles outbreak 08/04/2017   Altered mental status 01/05/2017   Dementia due to Alzheimer's disease ([place]) 08/03/2016   Fall 08/02/2016   Acute encephalopathy 08/02/2016   HTN (hypertension) 08/02/2016   Glaucoma 08/02/2016   Elevated troponin 08/02/2016    Past Surgical History:  Procedure Laterality Date   ABDOMINAL HYSTERECTOMY     APPENDECTOMY     AQUEOUS SHUNT Right 05/06/2016   Procedure: AQUEOUS SHUNT;  Surgeon: Ronnell Freshwater, MD;  Location: [place];  Service: Ophthalmology;  Laterality:  Right;  SCLERAL PATCH GRAFT AHMED FP7   EYE SURGERY     glucacoma numerous times     Prior to Admission medications   Medication Sig Start Date End Date Taking? Authorizing Provider  aspirin EC 81 MG EC tablet Take 1 tablet (81 mg total) by mouth daily. Swallow whole. 01/19/20   Nolberto Hanlon, MD  atorvastatin (LIPITOR) 40 MG tablet Take 1 tablet (40 mg total) by mouth daily. 01/19/20   Nolberto Hanlon, MD  clopidogrel (PLAVIX) 75 MG tablet Take 75 mg by mouth daily. 02/08/20   [provider]  latanoprost (XALATAN) 0.005 % ophthalmic solution Place 1 drop into the left eye at bedtime.     [provider]  timolol (TIMOPTIC) 0.5 % ophthalmic solution Place 1 drop into the right eye 2 (two) times daily.    [provider]  verapamil (CALAN-SR) 120 MG CR tablet Take 120 mg by mouth daily.     [provider]    Allergies Codeine, Sulfa antibiotics, and Penicillins  Family History  Problem Relation Age of Onset   Heart disease Father    Stroke Daughter     Social History Social History   Tobacco Use   Smoking status: Former   Smokeless tobacco: Never   Tobacco comments:    quit over 45 yrs ago  Vaping Use   Vaping Use: Never used  Substance Use  Topics   Alcohol use: No   Drug use: No    Review of Systems  Constitutional: fever/chills Eyes: No visual changes. ENT: No sore throat. Cardiovascular: Denies chest pain. Respiratory: Denies shortness of breath. Gastrointestinal: No abdominal pain.  No nausea, no vomiting.  No diarrhea.  No constipation. Genitourinary: Negative for dysuria. Musculoskeletal: Negative for back pain. Skin: Negative for rash. Neurological: Negative for headaches, focal weakness  ____________________________________________   PHYSICAL EXAM:  VITAL SIGNS: ED Triage Vitals  Enc Vitals Group     BP 02/04/21 1158 (!) 172/91     Pulse Rate 02/04/21 1158 94     Resp 02/04/21 1158 16     Temp 02/04/21 1158 (!) 101.6  F (38.7 C)     Temp Source 02/04/21 1158 Oral     SpO2 02/04/21 1158 98 %     Weight 02/04/21 1159 110 lb 3.7 oz (50 kg)     Height 02/04/21 1159 5\' 3"  (1.6 m)     Head Circumference --      Peak Flow --      Pain Score --      Pain Loc --      Pain Edu? --      Excl. in [place]? --     Constitutional: Alert and oriented.  Goes to sleep easily but again easily aroused.  Will answer questions appropriately. Eyes: Conjunctivae are normal. PER Head: Atraumatic. Nose: No congestion/rhinnorhea. Mouth/Throat: Mucous membranes are moist.  Oropharynx non-erythematous. Neck: No stridor.  Cardiovascular: Normal rate, regular rhythm. Grossly normal heart sounds.  Good peripheral circulation. Respiratory: Normal respiratory effort.  No retractions. Lungs CTAB. Gastrointestinal: Soft and nontender. No distention. No abdominal bruits. No CVA tenderness. Musculoskeletal: No lower extremity tenderness nor edema.   Neurologic:  Normal speech and language. No gross focal neurologic deficits are appreciated. No gait instability. Skin:  Skin is warm, dry and intact. No rash noted. Psychiatric: Mood and affect are normal. Speech and behavior are normal.  ____________________________________________   LABS (all labs ordered are listed, but only abnormal results are displayed)  Labs Reviewed  COMPREHENSIVE METABOLIC PANEL - Abnormal; Notable for the following components:      Result Value   Sodium 134 (*)    Potassium 3.3 (*)    Creatinine, Ser 0.42 (*)    Calcium 8.6 (*)    Albumin 3.4 (*)    Anion gap 4 (*)    All other components within normal limits  CBC WITH DIFFERENTIAL/PLATELET - Abnormal; Notable for the following components:   Hemoglobin 11.9 (*)    Platelets 143 (*)    Neutro Abs 1.5 (*)    Monocytes Absolute 1.5 (*)    All other components within normal limits  LACTIC ACID, PLASMA  PROCALCITONIN  URINALYSIS, COMPLETE (UACMP) WITH MICROSCOPIC  LACTIC ACID, PLASMA    ____________________________________________  EKG   ____________________________________________  RADIOLOGY Gertha Calkin, personally viewed and evaluated these images (plain radiographs) as part of my medical decision making, as well as reviewing the written report by the radiologist.  ED MD interpretation: Chest x-ray does not appear to show anything although there is some haziness on the left between the chest wall and the heart.  Official radiology report(s): DG Chest 2 View  Result Date: 02/04/2021 CLINICAL DATA:  Fever. EXAM: CHEST - 2 VIEW COMPARISON:  January 05, 2017 FINDINGS: Stable cardiomegaly. The hila and mediastinum are unremarkable. No pneumothorax. The right lung is clear. Mild opacity in the lingula  on the frontal view. No other abnormalities are identified. IMPRESSION: Mild opacity in the lingula may represent atelectasis. Early infiltrate considered less likely. Recommend clinical correlation. Electronically Signed   By: Dorise Bullion III M.D   On: 02/04/2021 13:17    ____________________________________________   PROCEDURES  Procedure(s) performed (including Critical Care):  Procedures   ____________________________________________   INITIAL IMPRESSION / ASSESSMENT AND PLAN / ED COURSE  Patient with weakness due to COVID and a high fever.  She is unable to be cared for at home.  Additionally she does seem to get hypoxic at times.  We will have to get her in the hospital to treat her COVID fever and hypoxia.              ____________________________________________   FINAL CLINICAL IMPRESSION(S) / ED DIAGNOSES  Final diagnoses:  Fever, unspecified fever cause  COVID  Weakness  Hypoxia     ED Discharge Orders     None        Note:  This document was prepared using Dragon voice recognition software and may include unintentional dictation errors.    Nena Polio, MD 02/04/21 1322    Nena Polio, MD 02/04/21  1322

## 2021-02-04 NOTE — H&P (Signed)
History and Physical    Karen Krueger WNU:[id number] DOB: 1932-05-09 DOA: 02/04/2021  Referring MD/NP/PA:   PCP: Idelle Crouch, MD   Patient coming from:  The patient is coming from home.      Chief Complaint: fever, cough, SOB and weakness  HPI: Karen Krueger is a 87 y.o. female with medical history significant of hypertension, hyperlipidemia, stroke, TIA, left eye blindness, dementia, glaucoma, UTI, who presents with fever, cough, shortness of breath and weakness.  Patient has dementia, and cannot provide accurate medical history.  I called her daughter by phone. Per patient's daughter, patient's daughter and son-in-law both has COVID infection.  Patient developed fever, chills, cough, shortness of breath and generalized weakness yesterday.  She was tested positive at home for COVID 19 yesterday.  Patient does not have nausea, vomiting, diarrhea or abdominal pain per her daughter.  No symptoms of UTI.  Patient has dementia, normally is not oriented x3.  Her mental status seems to be at her baseline.  Patient was found to have oxygen desaturation to 89% on room air and 4L of nasal cannula oxygen started in the ED.  ED Course: pt was found to have WBC 4.4, lactic acid is 0.9, pending COVID-19 test, potassium 3.3, renal function okay, temperature 101.6, blood pressure 149/89, heart rate 95, RR 22, chest x-ray showed mild opacity in the lingula lobe.  Patient is admitted to [facility] bed as inpatient.  Review of Systems: Could not reviewed accurately due to dementia.  Allergy:  Allergies  Allergen Reactions   Codeine Nausea And Vomiting   Sulfa Antibiotics Other (See Comments)    Unknown    Penicillins Nausea Only    Past Medical History:  Diagnosis Date   Blind left eye    Cancer ([place])    previous skin cancer    Dementia ([place])    Glaucoma    blind in Left eye    Hypertension    controlled with medication    Stroke [facility])    two previous strokes, 2005, 2011     Past  Surgical History:  Procedure Laterality Date   ABDOMINAL HYSTERECTOMY     APPENDECTOMY     AQUEOUS SHUNT Right 05/06/2016   Procedure: AQUEOUS SHUNT;  Surgeon: Ronnell Freshwater, MD;  Location: [facility] [facility];  Service: Ophthalmology;  Laterality: Right;  SCLERAL PATCH GRAFT AHMED FP7   EYE SURGERY     glucacoma numerous times     Social History:  reports that she has quit smoking. She has never used smokeless tobacco. She reports that she does not drink alcohol and does not use drugs.  Family History:  Family History  Problem Relation Age of Onset   Heart disease Father    Stroke Daughter      Prior to Admission medications   Medication Sig Start Date End Date Taking? Authorizing Provider  acetaminophen (TYLENOL) 500 MG tablet Take 500 mg by mouth every 6 (six) hours as needed for fever.   Yes [provider]  clopidogrel (PLAVIX) 75 MG tablet Take 75 mg by mouth daily. 02/08/20  Yes [provider]  DM-Doxylamine-Acetaminophen (CORICIDIN HBP NIGHT CLD/FLU MS) 10-6.25-325 MG/15ML LIQD Take 15 mLs by mouth every 4 (four) hours as needed (cough).   Yes [provider]  latanoprost (XALATAN) 0.005 % ophthalmic solution Place 1 drop into the left eye at bedtime.    Yes [provider]  timolol (TIMOPTIC) 0.5 % ophthalmic solution Place 1 drop into the  right eye 2 (two) times daily.   Yes [provider]  verapamil (CALAN-SR) 120 MG CR tablet Take 120 mg by mouth daily.    Yes [provider]  aspirin EC 81 MG EC tablet Take 1 tablet (81 mg total) by mouth daily. Swallow whole. Patient not taking: No sig reported 01/19/20   Nolberto Hanlon, MD  atorvastatin (LIPITOR) 40 MG tablet Take 1 tablet (40 mg total) by mouth daily. Patient not taking: No sig reported 01/19/20   Nolberto Hanlon, MD    Physical Exam: Vitals:   02/04/21 1158 02/04/21 1159 02/04/21 1230 02/04/21 1316  BP: (!) 172/91  (!) 172/91 (!) 149/89  Pulse: 94  95 92   Resp: 16  (!) 22 (!) 22  Temp: (!) 101.6 F (38.7 C)     TempSrc: Oral     SpO2: 98%  92% (!) 89%  Weight:  50 kg    Height:  5\' 3"  (1.6 m)     General: Not in acute distress HEENT:       Eyes: PERRL, EOMI, no scleral icterus.       ENT: No discharge from the ears and nose, no pharynx injection, no tonsillar enlargement.        Neck: No JVD, no bruit, no mass felt. Heme: No neck lymph node enlargement. Cardiac: S1/S2, RRR, No murmurs, No gallops or rubs. Respiratory: has coarse breathing sound bilaterally.  GI: Soft, nondistended, nontender, no organomegaly, BS present. GU: No hematuria Ext: No pitting leg edema bilaterally. 1+DP/PT pulse bilaterally. Musculoskeletal: No joint deformities, No joint redness or warmth, no limitation of ROM in spin. Skin: No rashes.  Neuro: Alert, cranial nerves II-XII grossly intact, moves all extremities  Psych: Patient is not psychotic, no suicidal or hemocidal ideation.  Labs on Admission: I have personally reviewed following labs and imaging studies  CBC: Recent Labs  Lab 02/04/21 1203  WBC 4.4  NEUTROABS 1.5*  HGB 11.9*  HCT 36.7  MCV 92.7  PLT 836*   Basic Metabolic Panel: Recent Labs  Lab 02/04/21 1203 02/04/21 1210  NA 134*  --   K 3.3*  --   CL 101  --   CO2 29  --   GLUCOSE 93  --   BUN 22  --   CREATININE 0.42*  --   CALCIUM 8.6*  --   MG  --  1.8   GFR: Estimated Creatinine Clearance: 39.1 mL/min (A) (by C-G formula based on SCr of 0.42 mg/dL (L)). Liver Function Tests: Recent Labs  Lab 02/04/21 1203  AST 35  ALT 19  ALKPHOS 45  BILITOT 0.7  PROT 7.4  ALBUMIN 3.4*   No results for input(s): LIPASE, AMYLASE in the last 168 hours. No results for input(s): AMMONIA in the last 168 hours. Coagulation Profile: Recent Labs  Lab 02/04/21 1359  INR 1.1   Cardiac Enzymes: No results for input(s): CKTOTAL, CKMB, CKMBINDEX, TROPONINI in the last 168 hours. BNP (last 3 results) No results for input(s): PROBNP  in the last 8760 hours. HbA1C: No results for input(s): HGBA1C in the last 72 hours. CBG: No results for input(s): GLUCAP in the last 168 hours. Lipid Profile: No results for input(s): CHOL, HDL, LDLCALC, TRIG, CHOLHDL, LDLDIRECT in the last 72 hours. Thyroid Function Tests: No results for input(s): TSH, T4TOTAL, FREET4, T3FREE, THYROIDAB in the last 72 hours. Anemia Panel: No results for input(s): VITAMINB12, FOLATE, FERRITIN, TIBC, IRON, RETICCTPCT in the last 72 hours. Urine analysis:  Component Value Date/Time   COLORURINE YELLOW (A) 01/17/2020 0337   APPEARANCEUR HAZY (A) 01/17/2020 0337   APPEARANCEUR Clear 07/13/2014 1205   LABSPEC 1.017 01/17/2020 0337   LABSPEC 1.005 07/13/2014 1205   PHURINE 5.0 01/17/2020 0337   GLUCOSEU NEGATIVE 01/17/2020 0337   GLUCOSEU Negative 07/13/2014 1205   HGBUR NEGATIVE 01/17/2020 0337   BILIRUBINUR NEGATIVE 01/17/2020 0337   BILIRUBINUR Negative 07/13/2014 1205   [place] 01/17/2020 0337   PROTEINUR NEGATIVE 01/17/2020 0337   NITRITE NEGATIVE 01/17/2020 0337   LEUKOCYTESUR SMALL (A) 01/17/2020 0337   LEUKOCYTESUR Negative 07/13/2014 1205   Sepsis Labs: @LABRCNTIP (procalcitonin:4,lacticidven:4) )No results found for this or any previous visit (from the past 240 hour(s)).   Radiological Exams on Admission: DG Chest 2 View  Result Date: 02/04/2021 CLINICAL DATA:  Fever. EXAM: CHEST - 2 VIEW COMPARISON:  January 05, 2017 FINDINGS: Stable cardiomegaly. The hila and mediastinum are unremarkable. No pneumothorax. The right lung is clear. Mild opacity in the lingula on the frontal view. No other abnormalities are identified. IMPRESSION: Mild opacity in the lingula may represent atelectasis. Early infiltrate considered less likely. Recommend clinical correlation. Electronically Signed   By: Dorise Bullion III M.D   On: 02/04/2021 13:17     EKG: I have personally reviewed.     Assessment/Plan Principal Problem:   Pneumonia due to  COVID-19 virus Active Problems:   HTN (hypertension)   History of CVA (cerebrovascular accident)   Acute respiratory failure with hypoxia (HCC)   Sepsis ([place])   Hypokalemia   Acute respiratory failure with hypoxia due possible pneumonia due to COVID-19 virus: Patient had positive COVID test at home  Now has fever, clinically consistent with COVID pneumonia.  Chest x-ray showed mild opacity in lingula lobe. Pt meets criteria for sepsis with tachycardia with heart rate in 95, RR 22.  Lactic acid is normal.  Currently hemodynamically stable. Pending Covid19 test now. Will start treating pt for covid19.  -will admit to med-surg bed as inpt -Remdesivir per pharm -Solumedrol 40 mg bid -Bronchodilators -PRN Mucinex for cough -f/u Blood culture -Follow-up inflammatory markers -Will ask the patient to maintain an awake prone position for 16+ hours a day, if possible, with a minimum of 2-3 hours at a time -Will attempt to maintain euvolemia to a net negative fluid status  HTN (hypertension): -IV hydralazine as needed -Verapamil  History of CVA (cerebrovascular accident) -Plavix  Hypokalemia: Potassium 3.3 -Repleted potassium -Check magnesium level    DVT ppx:  SQ Lovenox Code Status: DNR per her daughter Family Communication: Yes, patient's daughter by phone Disposition Plan:  Anticipate discharge back to previous environment Consults called:  none Admission status and Level of care: Med-Surg:    as inpt      Status is: Inpatient  Remains inpatient appropriate because:Inpatient level of care appropriate due to severity of illness  Dispo: The patient is from: Home              Anticipated d/c is to: Home              Patient currently is not medically stable to d/c.   Difficult to place patient No          Date of Service 02/04/2021    [facility]   If 7PM-7AM, please contact night-coverage [email or web address] 02/04/2021, 2:30 PM

## 2021-02-04 NOTE — Progress Notes (Signed)
This RN informed by lab tech pt is refusing labs.  Dr. Blaine Hamper notified via secure chat.

## 2021-02-04 NOTE — Progress Notes (Signed)
Remdesivir - Pharmacy Brief Note   O:  ALT: 19 SpO2: 89% on 2L [place]   A/P:  Remdesivir 200 mg IVPB once followed by 100 mg IVPB daily x 4 days.   Pernell Dupre, PharmD, BCPS Clinical Pharmacist 02/04/2021 2:21 PM

## 2021-02-04 NOTE — Progress Notes (Signed)
Attempted to call daughter to update on pt and room assignment, no response.

## 2021-02-04 NOTE — ED Triage Notes (Signed)
Patient brought in via EMS from home. Patient lives at home with family. Per family patient has covid19 and fever for 24 hours. 1 dose tylenol given. Patient hx dementia

## 2021-02-04 NOTE — ED Notes (Signed)
Pts O2 was 89% put her on [facility] and she was able to recover.

## 2021-02-05 DIAGNOSIS — J1282 Pneumonia due to coronavirus disease 2019: Secondary | ICD-10-CM | POA: Diagnosis not present

## 2021-02-05 DIAGNOSIS — U071 COVID-19: Secondary | ICD-10-CM | POA: Diagnosis not present

## 2021-02-05 LAB — COMPREHENSIVE METABOLIC PANEL
ALT: 18 U/L (ref 0–44)
AST: 35 U/L (ref 15–41)
Albumin: 3.2 g/dL — ABNORMAL LOW (ref 3.5–5.0)
Alkaline Phosphatase: 40 U/L (ref 38–126)
Anion gap: 5 (ref 5–15)
BUN: 19 mg/dL (ref 8–23)
CO2: 27 mmol/L (ref 22–32)
Calcium: 8.5 mg/dL — ABNORMAL LOW (ref 8.9–10.3)
Chloride: 105 mmol/L (ref 98–111)
Creatinine, Ser: <0.3 mg/dL — ABNORMAL LOW (ref 0.44–1.00)
Glucose, Bld: 101 mg/dL — ABNORMAL HIGH (ref 70–99)
Potassium: 4.2 mmol/L (ref 3.5–5.1)
Sodium: 137 mmol/L (ref 135–145)
Total Bilirubin: 0.7 mg/dL (ref 0.3–1.2)
Total Protein: 7.6 g/dL (ref 6.5–8.1)

## 2021-02-05 LAB — C-REACTIVE PROTEIN: CRP: 1.1 mg/dL — ABNORMAL HIGH (ref <1.0)

## 2021-02-05 NOTE — Evaluation (Signed)
Physical Therapy Evaluation Patient Details Name: Karen Krueger MRN: [id number] DOB: [birth year] Today's Date: 02/05/2021   History of Present Illness  Pt is a 87 y.o. female with medical history significant of hypertension, hyperlipidemia, stroke, TIA, left eye blindness, dementia, glaucoma, UTI, who presents with fever, cough, shortness of breath and weakness.   Clinical Impression  Pt oriented to self only, pleasant but sleepy throughout session. Pt poor historian, per chart review pt requires assist at baseline for all ADLs and mobility, including a squat pivot transfer from bed to pt WC.   The patient was able to move all extremities on command against gravity. Supine to sit with PT facilitation of task, with pt understanding required only a minA. Stand pivot performed 2-3 times, varying max-modA depending on pt's understanding. Up in chair with all needs in reach. Overall the patient demonstrated near return to baseline level of functioning, but would benefit from further skilled PT intervention to maximize safety, independence, and mobility.    Follow Up Recommendations Home health PT;Supervision/Assistance - 24 hour    Equipment Recommendations  None recommended by PT    Recommendations for Other Services       Precautions / Restrictions Precautions Precautions: Fall Restrictions Weight Bearing Restrictions: No      Mobility  Bed Mobility Overal bed mobility: Needs Assistance Bed Mobility: Supine to Sit     Supine to sit: Min assist     General bed mobility comments: assist with facilitation of task, with patient understanding able to come up into sitting with minA    Transfers Overall transfer level: Needs assistance   Transfers: Stand Pivot Transfers   Stand pivot transfers: Mod assist;Max assist       General transfer comment: with time to increase patient understanding, pt able to complete with modA. performed twice  Ambulation/Gait              General Gait Details: deferred  Stairs            Wheelchair Mobility    Modified Rankin (Stroke Patients Only)       Balance Overall balance assessment: Needs assistance Sitting-balance support: Feet supported Sitting balance-Leahy Scale: Fair       Standing balance-Leahy Scale: Poor                               Pertinent Vitals/Pain Pain Assessment: Faces Faces Pain Scale: No hurt    Home Living Family/patient expects to be discharged to:: Private residence   Available Help at Discharge: Family;Available 24 hours/day Type of Home: House           Additional Comments: Pt poor historian, history obtained from chart review/ previous admission in 2021    Prior Function Level of Independence: Needs assistance   Gait / Transfers Assistance Needed: per chart pt has w/C, hospital bed, w/c, BSC, RW, SPT at baseline for bed to [place], BSC for mobility  ADL's / Homemaking Assistance Needed: per chart family provides transportation, meals, meds, housekeeping, assist with all ADLs, pt legally blind        Hand Dominance   Dominant Hand: Right    Extremity/Trunk Assessment   Upper Extremity Assessment Upper Extremity Assessment:  (able to lift extremities against gravity)    Lower Extremity Assessment Lower Extremity Assessment:  (pt able to lift LEs against gravity without assist)    Cervical / Trunk Assessment Cervical / Trunk Assessment: Kyphotic  Communication  Communication: No difficulties (dementia)  Cognition Arousal/Alertness: Lethargic Behavior During Therapy: Flat affect Overall Cognitive Status: History of cognitive impairments - at baseline                                        General Comments      Exercises     Assessment/Plan    PT Assessment Patient needs continued PT services  PT Problem List Decreased strength;Decreased balance;Decreased mobility       PT Treatment Interventions Balance  training;Gait training;Neuromuscular re-education;Functional mobility training;Therapeutic exercise;Patient/family education;Therapeutic activities    PT Goals (Current goals can be found in the Care Plan section)  Acute Rehab PT Goals PT Goal Formulation: Patient unable to participate in goal setting Time For Goal Achievement: 02/19/21 Potential to Achieve Goals: Fair    Frequency Min 2X/week   Barriers to discharge        Co-evaluation               AM-PAC PT "6 Clicks" Mobility  Outcome Measure Help needed turning from your back to your side while in a flat bed without using bedrails?: A Little Help needed moving from lying on your back to sitting on the side of a flat bed without using bedrails?: A Little Help needed moving to and from a bed to a chair (including a wheelchair)?: A Lot Help needed standing up from a chair using your arms (e.g., wheelchair or bedside chair)?: A Lot Help needed to walk in hospital room?: A Lot Help needed climbing 3-5 steps with a railing? : Total 6 Click Score: 13    End of Session Equipment Utilized During Treatment: Gait belt Activity Tolerance: Patient tolerated treatment well Patient left: in chair;with call bell/phone within reach;with chair alarm set Nurse Communication: Mobility status PT Visit Diagnosis: Other abnormalities of gait and mobility (R26.89);Muscle weakness (generalized) (M62.81);Difficulty in walking, not elsewhere classified (R26.2)    Time: 1345-1416 PT Time Calculation (min) (ACUTE ONLY): 31 min   Charges:   PT Evaluation $PT Eval Low Complexity: 1 Low PT Treatments $Therapeutic Activity: 23-37 mins      Lieutenant Diego PT, DPT 2:41 PM,02/05/21

## 2021-02-05 NOTE — Progress Notes (Signed)
PROGRESS NOTE  Karen Krueger  DOB: 08-17-1931  PCP: Idelle Crouch, MD DJM:[id number]  DOA: 02/04/2021  LOS: 1 day  Hospital Day: 2   Chief Complaint  Patient presents with   Fever    Brief narrative: Karen Krueger is a 87 y.o. female with PMH significant for HTN, HLD, stroke, left eye blindness, dementia, glaucoma, UTI. Patient presented to the ED on 7/17 with complaint of fever, cough, shortness of breath, weakness. She lives with her daughter and son-in-law both of whom have COVID-19 infection. The day before admission, patient tested COVID positive at home.  In the ED, patient was noted to have a fever of 101.6, respiratory rate 22, hypoxic to 89% on room air and was started on 4 L oxygen by nasal cannula. Labs showed WBC count 4.4, lactic acid 0.9. Chest x-ray showed mild opacity in the lingula. Admitted to hospitalist service.  Subjective: Patient was seen and examined this morning.  Elderly demented female.  Alert, awake, not oriented.  Lying down in bed.  Slow to respond.  Not in distress.  Not requiring supplemental oxygen.  Assessment/Plan: COVID pneumonia Acute respiratory failure with hypoxia  -Presented with shortness of breath, cough, fever -COVID test: COVID-positive on test at home. -Chest imaging: Mild opacity in the lingula -Treatment: Currently on IV remdesivir, IV Solu-Medrol, bronchodilators, Mucinex -Procalcitonin level normal.  Not on antibiotics. -Initially required supplemental oxygen.  Currently breathing on room air.  -Supportive care: Vitamin C, Zinc, PRN inhalers, Tylenol, Antitussives (benzonatate/ Mucinex/Tussionex).   -Encouraged incentive spirometry, prone position, out of bed and early mobilization as much as possible -WBC and inflammatory markers trend as below.  Recent Labs  Lab 02/04/21 1203 02/04/21 1205 02/04/21 1958 02/05/21 0426  WBC 4.4  --   --   --   LATICACIDVEN  --  0.9  --   --   PROCALCITON <0.10  --   --   --    DDIMER  --   --  0.89*  --   CRP  --   --  0.9 1.1*  ALT 19  --   --  18    HTN (hypertension): -On verapamil 120 mg daily.  Continue to monitor blood pressure.   History of CVA (cerebrovascular accident) -Aspirin, Plavix, statin to continue   Hypokalemia -Improved with replacement. Recent Labs  Lab 02/04/21 1203 02/04/21 1210 02/05/21 0426  K 3.3*  --  4.2  MG  --  1.8  --    Mobility: PT/OT eval ordered. Code Status:   Code Status: DNR  Nutritional status: Body mass index is 19.53 kg/m.     Diet:  Diet Order             Diet heart healthy/carb modified Room service appropriate? Yes; Fluid consistency: Thin  Diet effective now                  DVT prophylaxis:  enoxaparin (LOVENOX) injection 40 mg Start: 02/04/21 1830   Antimicrobials: Currently not on IV antibiotics. Fluid: Not on IV fluid Consultants: None Family Communication: None at bedside  Status is: Inpatient  Remains inpatient appropriate because: IV treatment for COVID  Dispo: The patient is from: Home              Anticipated d/c is to: Hopefully home.  Pending PT/OT eval pending              Patient currently is not medically stable to d/c.   Difficult to  place patient No     Infusions:   remdesivir 100 mg in NS 100 mL 100 mg (02/05/21 1146)    Scheduled Meds:  atorvastatin  40 mg Oral Daily   clopidogrel  75 mg Oral Daily   enoxaparin (LOVENOX) injection  40 mg Subcutaneous Q24H   feeding supplement  237 mL Oral BID BM   latanoprost  1 drop Left Eye QHS   methylPREDNISolone (SOLU-MEDROL) injection  40 mg Intravenous Q12H   timolol  1 drop Right Eye BID   verapamil  120 mg Oral Daily    Antimicrobials: Anti-infectives (From admission, onward)    Start     Dose/Rate Route Frequency Ordered Stop   02/05/21 1000  remdesivir 100 mg in sodium chloride 0.9 % 100 mL IVPB       See Hyperspace for full Linked Orders Report.   100 mg 200 mL/hr over 30 Minutes Intravenous Daily  02/04/21 1423 02/09/21 0959   02/04/21 1500  remdesivir 200 mg in sodium chloride 0.9% 250 mL IVPB       See Hyperspace for full Linked Orders Report.   200 mg 580 mL/hr over 30 Minutes Intravenous Once 02/04/21 1423 02/04/21 1640       PRN meds: acetaminophen, acetaminophen, albuterol, dextromethorphan-guaiFENesin, hydrALAZINE, ondansetron (ZOFRAN) IV   Objective: Vitals:   02/05/21 0417 02/05/21 0907  BP: 133/74 136/82  Pulse: 67 74  Resp: 16 18  Temp: 98.3 F (36.8 C) 98.9 F (37.2 C)  SpO2: 91% (!) 88%    Intake/Output Summary (Last 24 hours) at 02/05/2021 1329 Last data filed at 02/05/2021 0417 Gross per 24 hour  Intake 292.9 ml  Output 200 ml  Net 92.9 ml   Filed Weights   02/04/21 1159  Weight: 50 kg   Weight change:  Body mass index is 19.53 kg/m.   Physical Exam: General exam: Pleasant, elderly female.  Not in distress Skin: No rashes, lesions or ulcers. HEENT: Atraumatic, normocephalic, no obvious bleeding Lungs: Slow respiratory effort.  Clear to auscultation bilaterally CVS: Regular rate and rhythm, no murmur GI/Abd soft, nontender, nondistended, bowel sound present CNS: Alert, awake, slow to respond, demented, disoriented Psychiatry: Depressed look Extremities: No pedal edema, no calf tenderness  Data Review: I have personally reviewed the laboratory data and studies available.  Recent Labs  Lab 02/04/21 1203  WBC 4.4  NEUTROABS 1.5*  HGB 11.9*  HCT 36.7  MCV 92.7  PLT 143*   Recent Labs  Lab 02/04/21 1203 02/04/21 1210 02/05/21 0426  NA 134*  --  137  K 3.3*  --  4.2  CL 101  --  105  CO2 29  --  27  GLUCOSE 93  --  101*  BUN 22  --  19  CREATININE 0.42*  --  <0.30*  CALCIUM 8.6*  --  8.5*  MG  --  1.8  --     F/u labs ordered Unresulted Labs (From admission, onward)     Start     Ordered   02/06/21 0500  CBC with Differential/Platelet  Daily,   R      02/05/21 1329   02/06/21 5852  Basic metabolic panel  Daily,   R       02/05/21 1329   02/04/21 1744  Expectorated Sputum Assessment w Gram Stain, Rflx to Resp Cult  Once,   R        02/04/21 1744   02/04/21 1353  Resp Panel by RT-PCR (Flu A&B, Covid) Nasopharyngeal  Swab  (Tier 2 - Symptomatic/asymptomatic with Precautions )  ONCE - STAT,   STAT       Question Answer Comment  Is this test for diagnosis or screening Diagnosis of ill patient   Symptomatic for COVID-19 as defined by CDC Yes   Date of Symptom Onset 02/03/2021   Hospitalized for COVID-19 Yes   Admitted to ICU for COVID-19 No   Previously tested for COVID-19 Yes   Resident in a congregate (group) care setting Unknown   Employed in healthcare setting Unknown   Pregnant No   Has patient completed COVID vaccination(s) (2 doses of Pfizer/Moderna 1 dose of [employer]) Unknown      02/04/21 1352   02/04/21 1201  Urinalysis, Complete w Microscopic  ONCE - STAT,   STAT        02/04/21 1200            Signed, Terrilee Croak, MD [facility] 02/05/2021

## 2021-02-05 NOTE — Evaluation (Signed)
Occupational Therapy Evaluation Patient Details Name: Karen Krueger MRN: [id number] DOB: Apr 23, 1932 Today's Date: 02/05/2021    History of Present Illness Pt is a 87 y.o. female with medical history significant of hypertension, hyperlipidemia, stroke, TIA, left eye blindness, dementia, glaucoma, UTI, who presents with fever, cough, shortness of breath and weakness.   Clinical Impression   Pt seen for OT evaluation on this date. Upon arrival to room, pt sitting in recliner with L lateral lean, verbalizing that she felt wet. This author observed that pt was holding Ensure container sideways, with drink spilling on to lap, chair, and floor. Pt agreeable to assist from OT. Pt pleasantly confused throughout session, oriented only to self, and requiring frequent multi-modal cues for functional transfers. Per chart review, pt requires assistance at baseline for ADLs and mobility (i.e., squat pivot transfers from bed to [facility]). Plan to confirm PLOF with family when able.   Pt currently requires MIN A for UB dressing, SET-UP assist for familiar grooming tasks, MAX A for transfers to/from recliner, and MAX A for sit>supine transfers d/t current functional impairments (see OT problem list). Pt appears to demonstrate near to baseline independence with ADLs, however would benefit from additional skilled OT services to maximize return to PLOF and minimize risk of future falls, injury, caregiver burden, and readmission. Upon discharge, recommend HHOT and 24/7 supervision/assistance.    Follow Up Recommendations  Home health OT;Supervision/Assistance - 24 hour    Equipment Recommendations  None recommended by OT       Precautions / Restrictions Precautions Precautions: Fall Restrictions Weight Bearing Restrictions: No      Mobility Bed Mobility Overal bed mobility: Needs Assistance Bed Mobility: Sit to Supine     Supine to sit: Min assist Sit to supine: Max assist   General bed mobility comments: pt  with difficulty orienting body to space following multi-modal cues, and ultimately requiring MAX A for sit>supine transfer and to scoot towards [facility]    Transfers Overall transfer level: Needs assistance   Transfers: Stand Pivot Transfers;Sit to/from Stand Sit to Stand: Mod assist Stand pivot transfers: Max assist       General transfer comment: with time to increase patient understanding, pt able to complete with modA. performed twice    Balance Overall balance assessment: Needs assistance Sitting-balance support: Feet supported;No upper extremity supported Sitting balance-Leahy Scale: Fair Sitting balance - Comments: fair sitting balance at EOB   Standing balance support: No upper extremity supported;During functional activity Standing balance-Leahy Scale: Zero Standing balance comment: MAX A to maintain standing balance during stand pivot transfer                           ADL either performed or assessed with clinical judgement   ADL Overall ADL's : Needs assistance/impaired     Grooming: Brushing hair;Set up;Bed level           Upper Body Dressing : Minimal assistance;Bed level Upper Body Dressing Details (indicate cue type and reason): to don/doff hospital gown                 Functional mobility during ADLs: Maximal assistance       Vision Baseline Vision/History: Legally blind (left eye blindness)              Pertinent Vitals/Pain Pain Assessment: No/denies pain Faces Pain Scale: No hurt     Hand Dominance Right   Extremity/Trunk Assessment Upper Extremity Assessment Upper Extremity Assessment: Generalized  weakness   Lower Extremity Assessment Lower Extremity Assessment: Generalized weakness   Cervical / Trunk Assessment Cervical / Trunk Assessment: Kyphotic   Communication Communication Communication: No difficulties (dementia)   Cognition Arousal/Alertness: Lethargic Behavior During Therapy: Flat affect Overall  Cognitive Status: History of cognitive impairments - at baseline                                 General Comments: A&Ox1. Demonstrates decreased short term memory and requires multi-modal cues for functional mobility. Demonstrates good understanding of ADL tools (i.e., comb is used to brush hair)              Home Living Family/patient expects to be discharged to:: Private residence Living Arrangements: Children Available Help at Discharge: Family;Available 24 hours/day Type of Home: House                           Additional Comments: Pt poor historian, history obtained from chart review/ previous admission in 2021      Prior Functioning/Environment Level of Independence: Needs assistance  Gait / Transfers Assistance Needed: per chart, pt has w/C, hospital bed, w/c, BSC, RW, SPT at baseline for bed to [place], BSC for mobility ADL's / Homemaking Assistance Needed: per chart, family provides transportation, meals, meds, housekeeping, assist with all ADLs, pt legally blind            OT Problem List: Decreased strength;Decreased activity tolerance;Impaired balance (sitting and/or standing);Impaired vision/perception;Decreased cognition;Decreased safety awareness      OT Treatment/Interventions: Self-care/ADL training;Therapeutic exercise;Energy conservation;DME and/or AE instruction;Therapeutic activities;Patient/family education;Balance training    OT Goals(Current goals can be found in the care plan section) Acute Rehab OT Goals Patient Stated Goal: to get comfortable OT Goal Formulation: With patient Time For Goal Achievement: 02/19/21 Potential to Achieve Goals: Good ADL Goals Pt Will Perform Grooming: with set-up;with supervision;sitting Pt Will Perform Upper Body Dressing: with set-up;with supervision;sitting Pt Will Transfer to Toilet: with min assist;stand pivot transfer;bedside commode  OT Frequency: Min 2X/week    AM-PAC OT "6 Clicks" Daily  Activity     Outcome Measure Help from another person eating meals?: A Little Help from another person taking care of personal grooming?: A Little Help from another person toileting, which includes using toliet, bedpan, or urinal?: A Lot Help from another person bathing (including washing, rinsing, drying)?: A Lot Help from another person to put on and taking off regular upper body clothing?: A Little Help from another person to put on and taking off regular lower body clothing?: A Lot 6 Click Score: 15   End of Session Equipment Utilized During Treatment: Gait belt Nurse Communication: Mobility status;Other (comment) (call bell not working properly)  Activity Tolerance: Patient tolerated treatment well Patient left: in bed;with call bell/phone within reach;with bed alarm set  OT Visit Diagnosis: Unsteadiness on feet (R26.81);Muscle weakness (generalized) (M62.81)                Time: 4098-1191 OT Time Calculation (min): 30 min Charges:  OT General Charges $OT Visit: 1 Visit OT Evaluation $OT Eval Moderate Complexity: 1 Mod OT Treatments $Self Care/Home Management : 23-37 mins  Fredirick Maudlin, OTR/L [place]

## 2021-02-05 NOTE — Clinical Social Work Note (Signed)
CSW acknowledges consult for home health/DME needs. Please consult PT and OT when appropriate to determine needs.  Dayton Scrape, [place]

## 2021-02-06 DIAGNOSIS — U071 COVID-19: Secondary | ICD-10-CM | POA: Diagnosis not present

## 2021-02-06 DIAGNOSIS — J1282 Pneumonia due to coronavirus disease 2019: Secondary | ICD-10-CM | POA: Diagnosis not present

## 2021-02-06 LAB — CBC WITH DIFFERENTIAL/PLATELET
Abs Immature Granulocytes: 0.04 10*3/uL (ref 0.00–0.07)
Basophils Absolute: 0 10*3/uL (ref 0.0–0.1)
Basophils Relative: 0 %
Eosinophils Absolute: 0 10*3/uL (ref 0.0–0.5)
Eosinophils Relative: 0 %
HCT: 37.8 % (ref 36.0–46.0)
Hemoglobin: 12.3 g/dL (ref 12.0–15.0)
Immature Granulocytes: 1 %
Lymphocytes Relative: 23 %
Lymphs Abs: 0.9 10*3/uL (ref 0.7–4.0)
MCH: 29.9 pg (ref 26.0–34.0)
MCHC: 32.5 g/dL (ref 30.0–36.0)
MCV: 92 fL (ref 80.0–100.0)
Monocytes Absolute: 0.4 10*3/uL (ref 0.1–1.0)
Monocytes Relative: 10 %
Neutro Abs: 2.5 10*3/uL (ref 1.7–7.7)
Neutrophils Relative %: 66 %
Platelets: 147 10*3/uL — ABNORMAL LOW (ref 150–400)
RBC: 4.11 MIL/uL (ref 3.87–5.11)
RDW: 14.5 % (ref 11.5–15.5)
WBC: 3.8 10*3/uL — ABNORMAL LOW (ref 4.0–10.5)
nRBC: 0 % (ref 0.0–0.2)

## 2021-02-06 LAB — BASIC METABOLIC PANEL
Anion gap: 10 (ref 5–15)
BUN: 34 mg/dL — ABNORMAL HIGH (ref 8–23)
CO2: 26 mmol/L (ref 22–32)
Calcium: 8.7 mg/dL — ABNORMAL LOW (ref 8.9–10.3)
Chloride: 103 mmol/L (ref 98–111)
Creatinine, Ser: 0.45 mg/dL (ref 0.44–1.00)
GFR, Estimated: >60 mL/min (ref >60)
Glucose, Bld: 124 mg/dL — ABNORMAL HIGH (ref 70–99)
Potassium: 3.9 mmol/L (ref 3.5–5.1)
Sodium: 139 mmol/L (ref 135–145)

## 2021-02-06 MED ORDER — PREDNISONE 20 MG PO TABS
40.0000 mg | ORAL_TABLET | Freq: Every day | ORAL | Status: DC
  Administered 2021-02-07 – 2021-02-08 (×2): 40 mg via ORAL
  Filled 2021-02-06 (×2): qty 2

## 2021-02-06 MED ORDER — HALOPERIDOL LACTATE 5 MG/ML IJ SOLN
1.0000 mg | Freq: Four times a day (QID) | INTRAMUSCULAR | Status: DC | PRN
  Administered 2021-02-07: 1 mg via INTRAMUSCULAR
  Filled 2021-02-06: qty 1

## 2021-02-06 MED ORDER — HALOPERIDOL 1 MG PO TABS
1.0000 mg | ORAL_TABLET | Freq: Four times a day (QID) | ORAL | Status: DC | PRN
  Administered 2021-02-07 – 2021-02-10 (×3): 1 mg via ORAL
  Filled 2021-02-06 (×5): qty 1

## 2021-02-06 NOTE — Progress Notes (Signed)
Mobility Specialist - Progress Note   02/06/21 1127  Mobility  Activity Transferred:  Bed to chair  Level of Assistance Moderate assist, patient does 50-74%  Assistive Device Other (Comment) (HHA)  Distance Ambulated (ft) 2 ft  Mobility Out of bed to chair with meals  Mobility Response Tolerated well  Mobility performed by Mobility specialist  $Mobility charge 1 Mobility    Pt lying in bed upon arrival, utilizing RA. Pt able to sit EOB with minA, with mobility tech using verbal and tactile cues for guidance throughout session. ModA SPT to recliner with call bell in reach and alarm set.    Kathee Delton Mobility Specialist 02/06/21, 11:29 AM

## 2021-02-06 NOTE — Progress Notes (Signed)
PROGRESS NOTE  Karen Krueger  DOB: [birth year]  PCP: Idelle Crouch, MD HOZ:[id number]  DOA: 02/04/2021  LOS: 2 days  Hospital Day: 3   Chief Complaint  Patient presents with   Fever    Brief narrative: Karen Krueger is a 87 y.o. female with PMH significant for HTN, HLD, stroke, left eye blindness, dementia, glaucoma, UTI. Patient presented to the ED on 7/17 with complaint of fever, cough, shortness of breath, weakness. She lives with her daughter and son-in-law both of whom have COVID-19 infection. The day before admission, patient tested COVID positive at home.  In the ED, patient was noted to have a fever of 101.6, respiratory rate 22, hypoxic to 89% on room air and was started on 4 L oxygen by nasal cannula. Labs showed WBC count 4.4, lactic acid 0.9. Chest x-ray showed mild opacity in the lingula. Admitted to hospitalist service.  Subjective: Patient was seen and examined this morning.   Sitting up in chair.  Not in distress.  Pleasant, demented.  Being fed by staff.  Not on supplemental oxygen.    Assessment/Plan: COVID pneumonia -Presented with shortness of breath, cough, fever -COVID test: COVID-positive on test at home. -Chest imaging: Mild opacity in the lingula -Treatment: Currently on a 5-day course of IV remdesivir, IV Solu-Medrol, bronchodilators, Mucinex.  Switched from Solu-Medrol to oral prednisone today.  Not hypoxic at this time, short course may be enough. -Procalcitonin level normal.  Not on antibiotics. -Initially required supplemental oxygen.  Currently breathing on room air.  -Supportive care: Vitamin C, Zinc, PRN inhalers, Tylenol, Antitussives (benzonatate/ Mucinex/Tussionex).   -Encouraged incentive spirometry, prone position, out of bed and early mobilization as much as possible -WBC and inflammatory markers trend as below. Recent Labs  Lab 02/04/21 1203 02/04/21 1205 02/04/21 1958 02/05/21 0426 02/06/21 0519  WBC 4.4  --   --   --  3.8*   LATICACIDVEN  --  0.9  --   --   --   PROCALCITON <0.10  --   --   --   --   DDIMER  --   --  0.89*  --   --   CRP  --   --  0.9 1.1*  --   ALT 19  --   --  18  --     HTN (hypertension) -On verapamil 120 mg daily.  Continue to monitor blood pressure.   History of CVA (cerebrovascular accident) -Aspirin, Plavix, statin to continue   Hypokalemia -Improved with replacement.  Recent Labs  Lab 02/04/21 1203 02/04/21 1210 02/05/21 0426 02/06/21 0519  K 3.3*  --  4.2 3.9  MG  --  1.8  --   --    Mobility: PT/OT eval obtained. Code Status:   Code Status: DNR  Nutritional status: Body mass index is 19.53 kg/m.     Diet:  Diet Order             Diet heart healthy/carb modified Room service appropriate? Yes; Fluid consistency: Thin  Diet effective now                  DVT prophylaxis:  enoxaparin (LOVENOX) injection 40 mg Start: 02/04/21 1830   Antimicrobials: Currently not on IV antibiotics. Fluid: Not on IV fluid Consultants: None Family Communication: None at bedside  Status is: Inpatient  Remains inpatient appropriate because: IV treatment for COVID  Dispo: The patient is from: Home  Anticipated d/c is to: PT eval obtained.  Home health PT recommended.  Family wants to look for SNF option.              Patient currently is not medically stable to d/c.   Difficult to place patient No     Infusions:   remdesivir 100 mg in NS 100 mL 100 mg (02/06/21 1254)    Scheduled Meds:  atorvastatin  40 mg Oral Daily   clopidogrel  75 mg Oral Daily   enoxaparin (LOVENOX) injection  40 mg Subcutaneous Q24H   feeding supplement  237 mL Oral BID BM   latanoprost  1 drop Left Eye QHS   [START ON 02/07/2021] predniSONE  40 mg Oral Q breakfast   timolol  1 drop Right Eye BID   verapamil  120 mg Oral Daily    Antimicrobials: Anti-infectives (From admission, onward)    Start     Dose/Rate Route Frequency Ordered Stop   02/05/21 1000  remdesivir 100 mg  in sodium chloride 0.9 % 100 mL IVPB       See Hyperspace for full Linked Orders Report.   100 mg 200 mL/hr over 30 Minutes Intravenous Daily 02/04/21 1423 02/09/21 0959   02/04/21 1500  remdesivir 200 mg in sodium chloride 0.9% 250 mL IVPB       See Hyperspace for full Linked Orders Report.   200 mg 580 mL/hr over 30 Minutes Intravenous Once 02/04/21 1423 02/04/21 1640       PRN meds: acetaminophen, acetaminophen, albuterol, dextromethorphan-guaiFENesin, hydrALAZINE, ondansetron (ZOFRAN) IV   Objective: Vitals:   02/06/21 0517 02/06/21 0816  BP: 140/80 (!) 143/81  Pulse: 79 66  Resp: 18 18  Temp: 98.1 F (36.7 C) 98.9 F (37.2 C)  SpO2: 91% 90%    Intake/Output Summary (Last 24 hours) at 02/06/2021 1333 Last data filed at 02/06/2021 0400 Gross per 24 hour  Intake 180 ml  Output 0 ml  Net 180 ml   Filed Weights   02/04/21 1159  Weight: 50 kg   Weight change:  Body mass index is 19.53 kg/m.   Physical Exam: General exam: Pleasant, elderly female.  Not in distress Skin: No rashes, lesions or ulcers. HEENT: Atraumatic, normocephalic, no obvious bleeding Lungs: Slow respiratory effort.  Clear to auscultation bilaterally CVS: Regular rate and rhythm, no murmur. GI/Abd soft, nontender, nondistended, bowel sound present CNS: Alert, awake, slow to respond, demented, disoriented Psychiatry: Depressed look Extremities: No pedal edema, no calf tenderness  Data Review: I have personally reviewed the laboratory data and studies available.  Recent Labs  Lab 02/04/21 1203 02/06/21 0519  WBC 4.4 3.8*  NEUTROABS 1.5* 2.5  HGB 11.9* 12.3  HCT 36.7 37.8  MCV 92.7 92.0  PLT 143* 147*   Recent Labs  Lab 02/04/21 1203 02/04/21 1210 02/05/21 0426 02/06/21 0519  NA 134*  --  137 139  K 3.3*  --  4.2 3.9  CL 101  --  105 103  CO2 29  --  27 26  GLUCOSE 93  --  101* 124*  BUN 22  --  19 34*  CREATININE 0.42*  --  <0.30* 0.45  CALCIUM 8.6*  --  8.5* 8.7*  MG  --   1.8  --   --     F/u labs ordered Unresulted Labs (From admission, onward)     Start     Ordered   02/06/21 0500  CBC with Differential/Platelet  Daily,   R  02/05/21 1329   02/06/21 3704  Basic metabolic panel  Daily,   R      02/05/21 1329   02/04/21 1744  Expectorated Sputum Assessment w Gram Stain, Rflx to Resp Cult  Once,   R        02/04/21 1744   02/04/21 1353  Resp Panel by RT-PCR (Flu A&B, Covid) Nasopharyngeal Swab  (Tier 2 - Symptomatic/asymptomatic with Precautions )  ONCE - STAT,   STAT       Question Answer Comment  Is this test for diagnosis or screening Diagnosis of ill patient   Symptomatic for COVID-19 as defined by CDC Yes   Date of Symptom Onset 02/03/2021   Hospitalized for COVID-19 Yes   Admitted to ICU for COVID-19 No   Previously tested for COVID-19 Yes   Resident in a congregate (group) care setting Unknown   Employed in healthcare setting Unknown   Pregnant No   Has patient completed COVID vaccination(s) (2 doses of Pfizer/Moderna 1 dose of [employer]) Unknown      02/04/21 1352   02/04/21 1201  Urinalysis, Complete w Microscopic  ONCE - STAT,   STAT        02/04/21 1200            Signed, Terrilee Croak, MD [facility] 02/06/2021

## 2021-02-06 NOTE — TOC Initial Note (Signed)
Transition of Care [facility]) - Initial/Assessment Note    Patient Details  Name: Karen Krueger MRN: [id number] Date of Birth: Aug 28, 1931  [facility] CM/SW Contact:    Candie Chroman, LCSW Phone Number: 02/06/2021, 9:31 AM  Clinical Narrative:  Patient only oriented to self and on airborne/contact precautions. CSW called patient's daughter, introduced role, and explained that PT recommendations would be discussed. Daughter has been trying to work on SNF placement from home and stated she is on wait list at [facility]. Patient lives home with her disabled son. Daughter comes over or brings her home with her to provide care. Daughter's husband has dementia so she said it is beginning to be too much. She is hoping to transition to long-term care after rehab. Discussed applying for Medicaid and patient would not be able to afford private pay. Daughter said patient took a home COVID test on Saturday 7/16. Told her SNF would not be able to accept her until 10 days from positive test. Sent secure chat to MD, PT, and OT to see if she would be qualify for SNF at all.                Expected Discharge Plan: Skilled Nursing Facility Barriers to Discharge: Continued Medical Work up   Patient Goals and CMS Choice Patient states their goals for this hospitalization and ongoing recovery are:: Patient not fully oriented.      Expected Discharge Plan and Services Expected Discharge Plan: [place] Choice: [place] arrangements for the past 2 months: Single Family Home                                      Prior Living Arrangements/Services Living arrangements for the past 2 months: Single Family Home Lives with:: Adult Children Patient language and need for interpreter reviewed:: Yes Do you feel safe going back to the place where you live?: Yes      Need for Family Participation in Patient Care: Yes (Comment) Care giver  support system in place?: Yes (comment)   Criminal Activity/Legal Involvement Pertinent to Current Situation/Hospitalization: No - Comment as needed  Activities of Daily Living Home Assistive Devices/Equipment: Wheelchair, Eyeglasses ADL Screening (condition at time of admission) Patient's cognitive ability adequate to safely complete daily activities?: No Is the patient deaf or have difficulty hearing?: No Does the patient have difficulty seeing, even when wearing glasses/contacts?: Yes Does the patient have difficulty concentrating, remembering, or making decisions?: Yes Patient able to express need for assistance with ADLs?: No Does the patient have difficulty dressing or bathing?: Yes Independently performs ADLs?: No Communication: Independent Dressing (OT): Dependent Is this a change from baseline?: Pre-admission baseline Grooming: Dependent Is this a change from baseline?: Pre-admission baseline Feeding: Independent Bathing: Dependent Is this a change from baseline?: Pre-admission baseline Toileting: Dependent Is this a change from baseline?: Pre-admission baseline In/Out Bed: Dependent Is this a change from baseline?: Pre-admission baseline Walks in Home: Dependent Is this a change from baseline?: Pre-admission baseline Does the patient have difficulty walking or climbing stairs?: Yes Weakness of Legs: Both Weakness of Arms/Hands: Both  Permission Sought/Granted Permission sought to share information with : Facility Sport and exercise psychologist, Family Supports    Share Information with NAME: [name]  Permission granted to share info w AGENCY: SNF's  Permission granted to share info w Relationship:  Daughter  Permission granted to share info w Contact Information: [phone]  Emotional Assessment Appearance:: Appears stated age Attitude/Demeanor/Rapport: Unable to Assess Affect (typically observed): Unable to Assess Orientation: : Oriented to Self Alcohol /  Substance Use: Not Applicable Psych Involvement: No (comment)  Admission diagnosis:  Weakness [R53.1] Hypoxia [R09.02] Fever, unspecified fever cause [R50.9] COVID [U07.1] Pneumonia due to COVID-19 virus [U07.1, J12.82] Patient Active Problem List   Diagnosis Date Noted   Pneumonia due to COVID-19 virus 02/04/2021   Acute respiratory failure with hypoxia ([place]) 02/04/2021   Sepsis ([place]) 02/04/2021   Hypokalemia 02/04/2021   Fever    TIA (transient ischemic attack) 01/17/2020   Blindness 01/17/2020   History of CVA (cerebrovascular accident) 01/17/2020   Hyponatremia 08/04/2017   UTI (urinary tract infection) 08/04/2017   Shingles outbreak 08/04/2017   Altered mental status 01/05/2017   Dementia due to Alzheimer's disease ([place]) 08/03/2016   Fall 08/02/2016   Acute encephalopathy 08/02/2016   HTN (hypertension) 08/02/2016   Glaucoma 08/02/2016   Elevated troponin 08/02/2016   PCP:  Idelle Crouch, MD Pharmacy:   [place], [place] - [address] [place] [address] Phone: [phone] Fax: [place] #[id number] [place], [place] - [place] AT [facility] [place] [address] Phone: [phone] Fax: [phone]     Social Determinants of Health (SDOH) Interventions    Readmission Risk Interventions No flowsheet data found.

## 2021-02-06 NOTE — Plan of Care (Signed)

## 2021-02-07 DIAGNOSIS — U071 COVID-19: Secondary | ICD-10-CM | POA: Diagnosis not present

## 2021-02-07 DIAGNOSIS — J1282 Pneumonia due to coronavirus disease 2019: Secondary | ICD-10-CM | POA: Diagnosis not present

## 2021-02-07 LAB — CBC WITH DIFFERENTIAL/PLATELET
Abs Immature Granulocytes: 0.05 10*3/uL (ref 0.00–0.07)
Basophils Absolute: 0 10*3/uL (ref 0.0–0.1)
Basophils Relative: 0 %
Eosinophils Absolute: 0 10*3/uL (ref 0.0–0.5)
Eosinophils Relative: 0 %
HCT: 37.9 % (ref 36.0–46.0)
Hemoglobin: 12.7 g/dL (ref 12.0–15.0)
Immature Granulocytes: 1 %
Lymphocytes Relative: 8 %
Lymphs Abs: 0.7 10*3/uL (ref 0.7–4.0)
MCH: 30 pg (ref 26.0–34.0)
MCHC: 33.5 g/dL (ref 30.0–36.0)
MCV: 89.4 fL (ref 80.0–100.0)
Monocytes Absolute: 1.4 10*3/uL — ABNORMAL HIGH (ref 0.1–1.0)
Monocytes Relative: 19 %
Neutro Abs: 5.6 10*3/uL (ref 1.7–7.7)
Neutrophils Relative %: 72 %
Platelets: 158 10*3/uL (ref 150–400)
RBC: 4.24 MIL/uL (ref 3.87–5.11)
RDW: 14.5 % (ref 11.5–15.5)
WBC: 7.8 10*3/uL (ref 4.0–10.5)
nRBC: 0 % (ref 0.0–0.2)

## 2021-02-07 LAB — BASIC METABOLIC PANEL
Anion gap: 12 (ref 5–15)
BUN: 33 mg/dL — ABNORMAL HIGH (ref 8–23)
CO2: 28 mmol/L (ref 22–32)
Calcium: 8.8 mg/dL — ABNORMAL LOW (ref 8.9–10.3)
Chloride: 102 mmol/L (ref 98–111)
Creatinine, Ser: 0.37 mg/dL — ABNORMAL LOW (ref 0.44–1.00)
GFR, Estimated: >60 mL/min (ref >60)
Glucose, Bld: 111 mg/dL — ABNORMAL HIGH (ref 70–99)
Potassium: 3.6 mmol/L (ref 3.5–5.1)
Sodium: 142 mmol/L (ref 135–145)

## 2021-02-07 NOTE — Progress Notes (Signed)
Physical Therapy Treatment Patient Details Name: Karen Krueger MRN: [id number] DOB: 05/20/1932 Today's Date: 02/07/2021    History of Present Illness Pt is a 87 y.o. female with medical history significant of hypertension, hyperlipidemia, stroke, TIA, left eye blindness, dementia, glaucoma, UTI, who presents with fever, cough, shortness of breath and weakness.    PT Comments    Patient more alert this session. Phone rang and pt assisted to speak with her daughter. Also assisted with a few sips of ensure at patient request. Pt continued to need facilitation with tactile cues to perform mobility. With the stand pivot, the pt was able to stand with modA, but maxA for successful pivot and safe return to sitting in chair at bedside. She was able to perform several seated exercises with constant multimodal cues. The patient would benefit from further skilled PT intervention to continue to progress towards goals. Recommendation remains appropriate for continued support and training in normal, familiar environment; may benefit from transition to LTC if family/caregiverunable to manage in home long-term.       Follow Up Recommendations  Home health PT;Supervision/Assistance - 24 hour     Equipment Recommendations  None recommended by PT    Recommendations for Other Services       Precautions / Restrictions Precautions Precautions: Fall Restrictions Weight Bearing Restrictions: No    Mobility  Bed Mobility Overal bed mobility: Needs Assistance Bed Mobility: Sit to Supine     Supine to sit: Mod assist          Transfers Overall transfer level: Needs assistance   Transfers: Stand Pivot Transfers;Sit to/from Stand Sit to Stand: Mod assist Stand pivot transfers: Mod assist;Max assist       General transfer comment: pt able to stand with modA, but maxA for successful pivot and safe return to sitting  Ambulation/Gait             General Gait Details:  deferred   Stairs             Wheelchair Mobility    Modified Rankin (Stroke Patients Only)       Balance Overall balance assessment: Needs assistance Sitting-balance support: Feet supported;No upper extremity supported Sitting balance-Leahy Scale: Fair     Standing balance support: No upper extremity supported;During functional activity Standing balance-Leahy Scale: Zero                              Cognition Arousal/Alertness: Awake/alert Behavior During Therapy: Flat affect Overall Cognitive Status: History of cognitive impairments - at baseline                                        Exercises General Exercises - Lower Extremity Ankle Circles/Pumps: AROM;Both;10 reps Heel Slides: AROM;Strengthening;Both;10 reps Hip ABduction/ADduction: AROM;Strengthening;Both;10 reps    General Comments        Pertinent Vitals/Pain Pain Assessment: Faces Faces Pain Scale: No hurt    Home Living                      Prior Function            PT Goals (current goals can now be found in the care plan section) Progress towards PT goals: Progressing toward goals    Frequency    Min 2X/week      PT Plan Current plan  remains appropriate    Co-evaluation              AM-PAC PT "6 Clicks" Mobility   Outcome Measure  Help needed turning from your back to your side while in a flat bed without using bedrails?: A Little   Help needed moving to and from a bed to a chair (including a wheelchair)?: A Lot Help needed standing up from a chair using your arms (e.g., wheelchair or bedside chair)?: A Lot Help needed to walk in hospital room?: A Lot Help needed climbing 3-5 steps with a railing? : Total 6 Click Score: 10    End of Session Equipment Utilized During Treatment: Gait belt Activity Tolerance: Patient tolerated treatment well Patient left: in chair;with call bell/phone within reach;with chair alarm set (chair fast  posey alarm) Nurse Communication: Mobility status PT Visit Diagnosis: Other abnormalities of gait and mobility (R26.89);Muscle weakness (generalized) (M62.81);Difficulty in walking, not elsewhere classified (R26.2)     Time: 8118-8677 PT Time Calculation (min) (ACUTE ONLY): 18 min  Charges:  $Therapeutic Exercise: 8-22 mins                     Lieutenant Diego PT, DPT 12:16 PM,02/07/21

## 2021-02-07 NOTE — Progress Notes (Signed)
Patient keeps pulling off tele monitor and haldol IM did not work in relieving her restlessness.  Told CCMD to put patient on standby because she is confused and keeps pulling off leads.  Will continue to monitor. Karen Krueger

## 2021-02-07 NOTE — Progress Notes (Signed)
Patient keeps pulling off cardiac monitor d/t confusion. Dr. Sidney Ace notified.  Given 1mg  of haldol IM q6 prn.  Will continue to monitor.  Christene Slates

## 2021-02-07 NOTE — TOC Progression Note (Signed)
Transition of Care [facility]) - Progression Note    Patient Details  Name: Karen Krueger MRN: [id number] Date of Birth: 08/11/1931  [facility] CM/SW [place], LCSW Phone Number: 02/07/2021, 1:00 PM  Clinical Narrative: Sent SNF/LTC referral to all facilities that accept outside referrals on hub.    Expected Discharge Plan: [place] Barriers to Discharge: Continued Medical Work up  Expected Discharge Plan and Services Expected Discharge Plan: [place] Choice: [place] arrangements for the past 2 months: Single Family Home                                       Social Determinants of Health (SDOH) Interventions    Readmission Risk Interventions No flowsheet data found.

## 2021-02-07 NOTE — Progress Notes (Signed)
PROGRESS NOTE  Karen Krueger  DOB: [birth year]  PCP: Idelle Crouch, MD YDX:[id number]  DOA: 02/04/2021  LOS: 3 days  Hospital Day: 4   Chief Complaint  Patient presents with   Fever    Brief narrative: Karen Krueger is a 87 y.o. female with PMH significant for HTN, HLD, stroke, left eye blindness, dementia, glaucoma, UTI. Patient presented to the ED on 7/17 with complaint of fever, cough, shortness of breath, weakness. She lives with her daughter and son-in-law both of whom have COVID-19 infection. The day before admission, patient tested COVID positive at home.  In the ED, patient was noted to have a fever of 101.6, respiratory rate 22, hypoxic to 89% on room air and was started on 4 L oxygen by nasal cannula. Labs showed WBC count 4.4, lactic acid 0.9. Chest x-ray showed mild opacity in the lingula. Admitted to hospitalist service.  Subjective: Patient was seen and examined this morning.   Sitting up in chair.  Not in distress.  Pleasant, demented.    Not on supplemental oxygen.  Breathing comfortably  Assessment/Plan: COVID pneumonia -Presented with shortness of breath, cough, fever -COVID test: COVID-positive on test at home. -Chest imaging: Mild opacity in the lingula -Treatment: Currently on a 5-day course of IV remdesivir started 7/17, steroids started 7/17 (switched to oral prednisone 7/20),  - hypoxia resolved, now comfortable on room air  Advanced dementia - daughter unable to care for at home. Pending snf vs long term care placement  HTN (hypertension) Controlled -On verapamil 120 mg daily.  Continue to monitor blood pressure.   History of CVA (cerebrovascular accident) -Aspirin, Plavix, statin to continue   Hypokalemia -Improved with replacement.  Recent Labs  Lab 02/04/21 1203 02/04/21 1210 02/05/21 0426 02/06/21 0519 02/07/21 0539  K 3.3*  --  4.2 3.9 3.6  MG  --  1.8  --   --   --    Mobility: PT/OT eval obtained. Code Status:   Code  Status: DNR  Nutritional status: Body mass index is 19.53 kg/m.     Diet:  Diet Order             Diet heart healthy/carb modified Room service appropriate? Yes; Fluid consistency: Thin  Diet effective now                  DVT prophylaxis:  enoxaparin (LOVENOX) injection 40 mg Start: 02/04/21 1830   Antimicrobials: Currently not on IV antibiotics. Fluid: Not on IV fluid Consultants: None Family Communication: daughter updated telephonically 7/20  Status is: Inpatient  Remains inpatient appropriate because: IV treatment for COVID  Dispo: The patient is from: Home              Anticipated d/c is to:  snf vs long term care               Patient currently is not medically stable to d/c.   Difficult to place patient No     Infusions:   remdesivir 100 mg in NS 100 mL 100 mg (02/07/21 0923)    Scheduled Meds:  atorvastatin  40 mg Oral Daily   clopidogrel  75 mg Oral Daily   enoxaparin (LOVENOX) injection  40 mg Subcutaneous Q24H   feeding supplement  237 mL Oral BID BM   latanoprost  1 drop Left Eye QHS   predniSONE  40 mg Oral Q breakfast   timolol  1 drop Right Eye BID   verapamil  120 mg  Oral Daily    Antimicrobials: Anti-infectives (From admission, onward)    Start     Dose/Rate Route Frequency Ordered Stop   02/05/21 1000  remdesivir 100 mg in sodium chloride 0.9 % 100 mL IVPB       See Hyperspace for full Linked Orders Report.   100 mg 200 mL/hr over 30 Minutes Intravenous Daily 02/04/21 1423 02/09/21 0959   02/04/21 1500  remdesivir 200 mg in sodium chloride 0.9% 250 mL IVPB       See Hyperspace for full Linked Orders Report.   200 mg 580 mL/hr over 30 Minutes Intravenous Once 02/04/21 1423 02/04/21 1640       PRN meds: acetaminophen, acetaminophen, albuterol, dextromethorphan-guaiFENesin, haloperidol **OR** haloperidol lactate, hydrALAZINE, ondansetron (ZOFRAN) IV   Objective: Vitals:   02/07/21 0541 02/07/21 0822  BP: (!) 166/88 (!) 149/92   Pulse: 61 (!) 57  Resp: 16 16  Temp: 98.3 F (36.8 C) 97.8 F (36.6 C)  SpO2: 93% 92%    Intake/Output Summary (Last 24 hours) at 02/07/2021 1125 Last data filed at 02/07/2021 0841 Gross per 24 hour  Intake 260 ml  Output --  Net 260 ml   Filed Weights   02/04/21 1159  Weight: 50 kg   Weight change:  Body mass index is 19.53 kg/m.   Physical Exam: General exam: Pleasant, elderly female.  Not in distress Skin: No rashes, lesions or ulcers. HEENT: Atraumatic, normocephalic, no obvious bleeding Lungs: Slow respiratory effort.  Clear to auscultation bilaterally CVS: Regular rate and rhythm, no murmur. GI/Abd soft, nontender, nondistended, bowel sound present CNS: Alert, awake, slow to respond, demented, disoriented Psychiatry: calm Extremities: No pedal edema,   Data Review: I have personally reviewed the laboratory data and studies available.  Recent Labs  Lab 02/04/21 1203 02/06/21 0519 02/07/21 0539  WBC 4.4 3.8* 7.8  NEUTROABS 1.5* 2.5 5.6  HGB 11.9* 12.3 12.7  HCT 36.7 37.8 37.9  MCV 92.7 92.0 89.4  PLT 143* 147* 158   Recent Labs  Lab 02/04/21 1203 02/04/21 1210 02/05/21 0426 02/06/21 0519 02/07/21 0539  NA 134*  --  137 139 142  K 3.3*  --  4.2 3.9 3.6  CL 101  --  105 103 102  CO2 29  --  27 26 28   GLUCOSE 93  --  101* 124* 111*  BUN 22  --  19 34* 33*  CREATININE 0.42*  --  <0.30* 0.45 0.37*  CALCIUM 8.6*  --  8.5* 8.7* 8.8*  MG  --  1.8  --   --   --     F/u labs ordered Unresulted Labs (From admission, onward)     Start     Ordered   02/06/21 0500  CBC with Differential/Platelet  Daily,   R      02/05/21 1329   02/06/21 9166  Basic metabolic panel  Daily,   R      02/05/21 1329   02/04/21 1744  Expectorated Sputum Assessment w Gram Stain, Rflx to Resp Cult  Once,   R        02/04/21 1744   02/04/21 1353  Resp Panel by RT-PCR (Flu A&B, Covid) Nasopharyngeal Swab  (Tier 2 - Symptomatic/asymptomatic with Precautions )  ONCE - STAT,    STAT       Question Answer Comment  Is this test for diagnosis or screening Diagnosis of ill patient   Symptomatic for COVID-19 as defined by CDC Yes   Date of  Symptom Onset 02/03/2021   Hospitalized for COVID-19 Yes   Admitted to ICU for COVID-19 No   Previously tested for COVID-19 Yes   Resident in a congregate (group) care setting Unknown   Employed in healthcare setting Unknown   Pregnant No   Has patient completed COVID vaccination(s) (2 doses of Pfizer/Moderna 1 dose of [employer]) Unknown      02/04/21 1352   02/04/21 1201  Urinalysis, Complete w Microscopic  ONCE - STAT,   STAT        02/04/21 1200            Signed, Desma Maxim, MD [facility] 02/07/2021

## 2021-02-07 NOTE — NC FL2 (Signed)
[place] LEVEL OF CARE SCREENING TOOL     IDENTIFICATION  Patient Name: Karen Krueger Birthdate: April 07, 1932 Sex: female Admission Date (Current Location): 02/04/2021  [facility] [place] Number:  Engineering geologist and Address:  [facility], [address] [place], [place] [address]      Provider Number: [id number]  Attending Physician Name and Address:  Gwynne Edinger, MD  Relative Name and Phone Number:       Current Level of Care: Hospital Recommended Level of Care: [place] Prior Approval Number:    Date Approved/Denied:   PASRR Number: [id number]  Discharge Plan: SNF    Current Diagnoses: Patient Active Problem List   Diagnosis Date Noted   Pneumonia due to COVID-19 virus 02/04/2021   Acute respiratory failure with hypoxia ([place]) 02/04/2021   Sepsis ([place]) 02/04/2021   Hypokalemia 02/04/2021   Fever    TIA (transient ischemic attack) 01/17/2020   Blindness 01/17/2020   History of CVA (cerebrovascular accident) 01/17/2020   Hyponatremia 08/04/2017   UTI (urinary tract infection) 08/04/2017   Shingles outbreak 08/04/2017   Altered mental status 01/05/2017   Dementia due to Alzheimer's disease ([place]) 08/03/2016   Fall 08/02/2016   Acute encephalopathy 08/02/2016   HTN (hypertension) 08/02/2016   Glaucoma 08/02/2016   Elevated troponin 08/02/2016    Orientation RESPIRATION BLADDER Height & Weight     Self  Normal Incontinent Weight: 110 lb 3.7 oz (50 kg) Height:  5\' 3"  (160 cm)  BEHAVIORAL SYMPTOMS/MOOD NEUROLOGICAL BOWEL NUTRITION STATUS  Other (Comment) (Restless)  (Dementia) Continent Diet (Heart healthy/carb modified)  AMBULATORY STATUS COMMUNICATION OF NEEDS Skin   Extensive Assist Verbally Bruising                       Personal Care Assistance Level of Assistance  Bathing, Feeding, Dressing Bathing Assistance: Maximum assistance Feeding assistance: Maximum assistance Dressing  Assistance: Maximum assistance     Functional Limitations Info  Sight, Hearing, Speech Sight Info: Adequate Hearing Info: Adequate Speech Info: Adequate    SPECIAL CARE FACTORS FREQUENCY  PT (By licensed PT), OT (By licensed OT)     PT Frequency: 5 x week OT Frequency: 5 x week            Contractures Contractures Info: Not present    Additional Factors Info  Code Status, Allergies Code Status Info: DNR Allergies Info: Codeine, Sulfa Antibiotics, Penicillins           Current Medications (02/07/2021):  This is the current hospital active medication list Current Facility-Administered Medications  Medication Dose Route Frequency Provider Last Rate Last Admin   acetaminophen (TYLENOL) suppository 650 mg  650 mg Rectal Q6H PRN Ivor Costa, MD       acetaminophen (TYLENOL) tablet 650 mg  650 mg Oral Q6H PRN Ivor Costa, MD       albuterol (VENTOLIN HFA) 108 (90 Base) MCG/ACT inhaler 2 puff  2 puff Inhalation Q4H PRN Ivor Costa, MD       atorvastatin (LIPITOR) tablet 40 mg  40 mg Oral Daily Ivor Costa, MD   40 mg at 02/07/21 8341   clopidogrel (PLAVIX) tablet 75 mg  75 mg Oral Daily Ivor Costa, MD   75 mg at 02/07/21 9622   dextromethorphan-guaiFENesin (MUCINEX DM) 30-600 MG per 12 hr tablet 1 tablet  1 tablet Oral BID PRN Ivor Costa, MD       enoxaparin (LOVENOX) injection 40 mg  40 mg Subcutaneous Q24H Ivor Costa, MD   40 mg at 02/06/21 1838   feeding supplement (ENSURE ENLIVE / ENSURE PLUS) liquid 237 mL  237 mL Oral BID BM Ivor Costa, MD   237 mL at 02/07/21 0918   haloperidol (HALDOL) tablet 1 mg  1 mg Oral Q6H PRN Mansy, Jan A, MD       Or   haloperidol lactate (HALDOL) injection 1 mg  1 mg Intramuscular Q6H PRN Mansy, Jan A, MD   1 mg at 02/07/21 0034   hydrALAZINE (APRESOLINE) injection 5 mg  5 mg Intravenous Q2H PRN Ivor Costa, MD       latanoprost (XALATAN) 0.005 % ophthalmic solution 1 drop  1 drop Left Eye Gordan Payment, MD   1 drop at 02/06/21 2052   ondansetron  (ZOFRAN) injection 4 mg  4 mg Intravenous Q8H PRN Ivor Costa, MD       predniSONE (DELTASONE) tablet 40 mg  40 mg Oral Q breakfast Dahal, Marlowe Aschoff, MD   40 mg at 02/07/21 0352   remdesivir 100 mg in sodium chloride 0.9 % 100 mL IVPB  100 mg Intravenous Daily Hallaji, Dani Gobble, RPH 200 mL/hr at 02/07/21 0923 100 mg at 02/07/21 0923   timolol (TIMOPTIC) 0.5 % ophthalmic solution 1 drop  1 drop Right Eye BID Ivor Costa, MD   1 drop at 02/07/21 0919   verapamil (CALAN-SR) CR tablet 120 mg  120 mg Oral Daily Ivor Costa, MD   120 mg at 02/07/21 4818     Discharge Medications: Please see discharge summary for a list of discharge medications.  Relevant Imaging Results:  Relevant Lab Results:   Additional Information SS#: [id number]. Tested positive with home COVID test on 7/16. Admitted to hospital 7/17. Daughter interested in long-term care and applying for Medicaid.  Candie Chroman, LCSW

## 2021-02-07 NOTE — Progress Notes (Signed)
Occupational Therapy Treatment Patient Details Name: Karen Krueger MRN: [id number] DOB: [birth year] Today's Date: 02/07/2021    History of present illness Pt is a 87 y.o. female with medical history significant of hypertension, hyperlipidemia, stroke, TIA, left eye blindness, dementia, glaucoma, UTI, who presents with fever, cough, shortness of breath and weakness.   OT comments  Pt seen for OT treatment on this date. Upon arrival to room, pt awake and sitting upright in recliner with NT present. Pt A&Ox1, reporting no pain, demonstrating improved alertness this date and able to follow 1-step commands more consistently (~70% of time). Pt agreeable to OT tx. Pt currently presents with impaired cognition, decreased strength, and decreased activity tolerance, and requires MIN A for feeding, SUPERVISION-MIN A for seated grooming tasks, and MOD A for sit>stand transfers. Pt continues to benefit from skilled OT services to maximize return to PLOF and minimize risk of future falls, injury, caregiver burden, and readmission. Will continue to follow POC. Discharge recommendation remains appropriate, however pt may benefit from transition to LTC if family is unable to manage level assistance needed to be home while receiving [place] in a familiar environment.    Follow Up Recommendations  Home health OT;Supervision/Assistance - 24 hour    Equipment Recommendations  None recommended by OT       Precautions / Restrictions Precautions Precautions: Fall Restrictions Weight Bearing Restrictions: No       Mobility Bed Mobility     Supine to sit: Mod assist     General bed mobility comments: not assessed, pt in recliner at beginning and end of session    Transfers Overall transfer level: Needs assistance   Transfers: Sit to/from Stand Sit to Stand: Mod assist        General transfer comment: pt able to stand with modA, but maxA for successful pivot and safe return to sitting    Balance  Overall balance assessment: Needs assistance Sitting-balance support: Feet supported;No upper extremity supported Sitting balance-Leahy Scale: Fair     Standing balance support: No upper extremity supported;During functional activity Standing balance-Leahy Scale: Zero Standing balance comment: MAX A to maintain standing balance                           ADL either performed or assessed with clinical judgement   ADL Overall ADL's : Needs assistance/impaired Eating/Feeding: Minimal assistance;Sitting Eating/Feeding Details (indicate cue type and reason): MIN A to load spoon with food and place in hand, with pt able to bring spoon to mouth Grooming: Wash/dry hands;Wash/dry face;Brushing hair;Supervision/safety;Set up;Applying deodorant;Minimal assistance;Sitting Grooming Details (indicate cue type and reason): MIN A to align hand with armpit while applying deodorant                             Functional mobility during ADLs: Maximal assistance        Cognition Arousal/Alertness: Awake/alert Behavior During Therapy: WFL for tasks assessed/performed Overall Cognitive Status: History of cognitive impairments - at baseline                                 General Comments: A&Ox1. Improved alertness this date and able to follow 1-step commands more consistently (~70% of time)                   Pertinent Vitals/ Pain  Pain Assessment: Faces Faces Pain Scale: No hurt         Frequency  Min 2X/week        Progress Toward Goals  OT Goals(current goals can now be found in the care plan section)  Progress towards OT goals: Progressing toward goals  Acute Rehab OT Goals Patient Stated Goal: none stated OT Goal Formulation: With patient Time For Goal Achievement: 02/19/21  Plan Discharge plan remains appropriate;Frequency remains appropriate       AM-PAC OT "6 Clicks" Daily Activity     Outcome Measure   Help from another  person eating meals?: A Little Help from another person taking care of personal grooming?: A Little Help from another person toileting, which includes using toliet, bedpan, or urinal?: A Lot Help from another person bathing (including washing, rinsing, drying)?: A Lot Help from another person to put on and taking off regular upper body clothing?: A Little Help from another person to put on and taking off regular lower body clothing?: A Lot 6 Click Score: 15    End of Session Equipment Utilized During Treatment: Gait belt  OT Visit Diagnosis: Unsteadiness on feet (R26.81);Muscle weakness (generalized) (M62.81)   Activity Tolerance Patient tolerated treatment well   Patient Left in chair;with chair alarm set;with call bell/phone within reach;with nursing/sitter in room   Nurse Communication Mobility status        Time: 9432-0037 OT Time Calculation (min): 25 min  Charges: OT General Charges $OT Visit: 1 Visit OT Treatments $Self Care/Home Management : 23-37 mins   Fredirick Maudlin, OTR/L [place]

## 2021-02-08 DIAGNOSIS — J1282 Pneumonia due to coronavirus disease 2019: Secondary | ICD-10-CM | POA: Diagnosis not present

## 2021-02-08 DIAGNOSIS — U071 COVID-19: Secondary | ICD-10-CM | POA: Diagnosis not present

## 2021-02-08 MED ORDER — VERAPAMIL HCL ER 240 MG PO TBCR
240.0000 mg | EXTENDED_RELEASE_TABLET | Freq: Every day | ORAL | Status: DC
  Administered 2021-02-09 – 2021-02-13 (×5): 240 mg via ORAL
  Filled 2021-02-08 (×7): qty 1

## 2021-02-08 MED ORDER — VERAPAMIL HCL ER 120 MG PO TBCR
120.0000 mg | EXTENDED_RELEASE_TABLET | Freq: Once | ORAL | Status: AC
  Administered 2021-02-08: 120 mg via ORAL
  Filled 2021-02-08: qty 1

## 2021-02-08 NOTE — Progress Notes (Signed)
PROGRESS NOTE  Karen Krueger  DOB: 1931-10-24  PCP: Idelle Crouch, MD HAL:[id number]  DOA: 02/04/2021  LOS: 4 days  Hospital Day: 5   Chief Complaint  Patient presents with   Fever    Brief narrative: Karen Krueger is a 87 y.o. female with PMH significant for HTN, HLD, stroke, left eye blindness, dementia, glaucoma, UTI. Patient presented to the ED on 7/17 with complaint of fever, cough, shortness of breath, weakness. She lives with her daughter and son-in-law both of whom have COVID-19 infection. The day before admission, patient tested COVID positive at home.  In the ED, patient was noted to have a fever of 101.6, respiratory rate 22, hypoxic to 89% on room air and was started on 4 L oxygen by nasal cannula. Labs showed WBC count 4.4, lactic acid 0.9. Chest x-ray showed mild opacity in the lingula. Admitted to hospitalist service.  Subjective: Patient was seen and examined this morning.   IN bed. Not in distress.  Pleasant, demented.    Not on supplemental oxygen.  Breathing comfortably  Assessment/Plan: COVID pneumonia -Presented with shortness of breath, cough, fever -COVID test: COVID-positive on test at home. -Chest imaging: Mild opacity in the lingula -Treatment: completed 5 day course remdesevie and steroids - hypoxia resolved, now comfortable on room air  Advanced dementia - daughter unable to care for at home. Pending snf vs long term care placement  HTN (hypertension) Bp elevated -On verapamil 120 mg daily, will increase to 240   History of CVA (cerebrovascular accident) -Aspirin, Plavix, statin to continue   Hypokalemia -Improved with replacement.  Recent Labs  Lab 02/04/21 1203 02/04/21 1210 02/05/21 0426 02/06/21 0519 02/07/21 0539  K 3.3*  --  4.2 3.9 3.6  MG  --  1.8  --   --   --    Mobility: PT/OT eval obtained. Code Status:   Code Status: DNR  Nutritional status: Body mass index is 19.53 kg/m.     Diet:  Diet Order              Diet heart healthy/carb modified Room service appropriate? Yes; Fluid consistency: Thin  Diet effective now                  DVT prophylaxis:  enoxaparin (LOVENOX) injection 40 mg Start: 02/04/21 1830   Antimicrobials: Currently not on IV antibiotics. Fluid: Not on IV fluid Consultants: None Family Communication: daughter updated telephonically 7/21  Status is: Inpatient  Remains inpatient appropriate because: IV treatment for COVID  Dispo: The patient is from: Home              Anticipated d/c is to:  snf vs long term care               Patient currently is not medically stable to d/c.   Difficult to place patient No     Infusions:     Scheduled Meds:  atorvastatin  40 mg Oral Daily   clopidogrel  75 mg Oral Daily   enoxaparin (LOVENOX) injection  40 mg Subcutaneous Q24H   feeding supplement  237 mL Oral BID BM   latanoprost  1 drop Left Eye QHS   timolol  1 drop Right Eye BID   verapamil  120 mg Oral Once   [START ON 02/09/2021] verapamil  240 mg Oral Daily    Antimicrobials: Anti-infectives (From admission, onward)    Start     Dose/Rate Route Frequency Ordered Stop   02/05/21  1000  remdesivir 100 mg in sodium chloride 0.9 % 100 mL IVPB       See Hyperspace for full Linked Orders Report.   100 mg 200 mL/hr over 30 Minutes Intravenous Daily 02/04/21 1423 02/08/21 1013   02/04/21 1500  remdesivir 200 mg in sodium chloride 0.9% 250 mL IVPB       See Hyperspace for full Linked Orders Report.   200 mg 580 mL/hr over 30 Minutes Intravenous Once 02/04/21 1423 02/04/21 1640       PRN meds: acetaminophen, acetaminophen, albuterol, dextromethorphan-guaiFENesin, haloperidol **OR** haloperidol lactate, hydrALAZINE, ondansetron (ZOFRAN) IV   Objective: Vitals:   02/08/21 0301 02/08/21 0700  BP: (!) 173/97 (!) 157/96  Pulse: 67 73  Resp: 16 18  Temp: 97.8 F (36.6 C) 98.3 F (36.8 C)  SpO2: 95% 96%    Intake/Output Summary (Last 24 hours) at 02/08/2021  1400 Last data filed at 02/07/2021 1805 Gross per 24 hour  Intake 440 ml  Output --  Net 440 ml   Filed Weights   02/04/21 1159  Weight: 50 kg   Weight change:  Body mass index is 19.53 kg/m.   Physical Exam: General exam: Pleasant, elderly female.  Not in distress Skin: No rashes, lesions or ulcers. HEENT: Atraumatic, normocephalic, no obvious bleeding Lungs: Slow respiratory effort.  Clear to auscultation bilaterally CVS: Regular rate and rhythm, no murmur. GI/Abd soft, nontender, nondistended, bowel sound present CNS: Alert, awake, slow to respond, demented, disoriented Psychiatry: calm Extremities: No pedal edema,   Data Review: I have personally reviewed the laboratory data and studies available.  Recent Labs  Lab 02/04/21 1203 02/06/21 0519 02/07/21 0539  WBC 4.4 3.8* 7.8  NEUTROABS 1.5* 2.5 5.6  HGB 11.9* 12.3 12.7  HCT 36.7 37.8 37.9  MCV 92.7 92.0 89.4  PLT 143* 147* 158   Recent Labs  Lab 02/04/21 1203 02/04/21 1210 02/05/21 0426 02/06/21 0519 02/07/21 0539  NA 134*  --  137 139 142  K 3.3*  --  4.2 3.9 3.6  CL 101  --  105 103 102  CO2 29  --  27 26 28   GLUCOSE 93  --  101* 124* 111*  BUN 22  --  19 34* 33*  CREATININE 0.42*  --  <0.30* 0.45 0.37*  CALCIUM 8.6*  --  8.5* 8.7* 8.8*  MG  --  1.8  --   --   --     F/u labs ordered Unresulted Labs (From admission, onward)     Start     Ordered   02/04/21 1744  Expectorated Sputum Assessment w Gram Stain, Rflx to Resp Cult  Once,   R        02/04/21 1744   02/04/21 1353  Resp Panel by RT-PCR (Flu A&B, Covid) Nasopharyngeal Swab  (Tier 2 - Symptomatic/asymptomatic with Precautions )  ONCE - STAT,   STAT       Question Answer Comment  Is this test for diagnosis or screening Diagnosis of ill patient   Symptomatic for COVID-19 as defined by CDC Yes   Date of Symptom Onset 02/03/2021   Hospitalized for COVID-19 Yes   Admitted to ICU for COVID-19 No   Previously tested for COVID-19 Yes   Resident  in a congregate (group) care setting Unknown   Employed in healthcare setting Unknown   Pregnant No   Has patient completed COVID vaccination(s) (2 doses of Pfizer/Moderna 1 dose of [employer]) Unknown  02/04/21 1352            Signed, Desma Maxim, MD [facility] 02/08/2021

## 2021-02-08 NOTE — TOC Progression Note (Addendum)
Transition of Care [facility]) - Progression Note    Patient Details  Name: Karen Krueger MRN: [id number] Date of Birth: Feb 20, 1932  Transition of Care [facility]) CM/SW [place], LCSW Phone Number: 02/08/2021, 9:21 AM  Clinical Narrative:  No bed offers yet. [facility] will review. Left message for [facility] admissions coordinator asking her to review. Called and updated daughter. Patient has a life insurance policy that she would be willing to cash in to help pay for a month. Patient gets [employer] manager. Sent secure chat to [facility] supervisor to let her know patient may need to be placed on difficult to place list.   12:09 pm: [facility] and [facility] are willing to accept patient once she has completed quarantine but if insurance denies authorization, she'll have to pay 30 days up front. [facility] is $254 per day [phone] up front) and [facility] is $280 per day for private room ($8400) and $260 for semiprivate room [phone]). Tried calling daughter but no answer. Will try again later.  2:06 pm: Tried calling daughter again. No answer.  Expected Discharge Plan: [place] Barriers to Discharge: Continued Medical Work up  Expected Discharge Plan and Services Expected Discharge Plan: [place] Choice: [place] arrangements for the past 2 months: Single Family Home                                       Social Determinants of Health (SDOH) Interventions    Readmission Risk Interventions No flowsheet data found.

## 2021-02-09 DIAGNOSIS — J1282 Pneumonia due to coronavirus disease 2019: Secondary | ICD-10-CM | POA: Diagnosis not present

## 2021-02-09 DIAGNOSIS — U071 COVID-19: Secondary | ICD-10-CM | POA: Diagnosis not present

## 2021-02-09 LAB — CULTURE, BLOOD (ROUTINE X 2)
Culture: NO GROWTH
Culture: NO GROWTH
Special Requests: ADEQUATE

## 2021-02-09 LAB — BASIC METABOLIC PANEL
Anion gap: 7 (ref 5–15)
BUN: 24 mg/dL — ABNORMAL HIGH (ref 8–23)
CO2: 29 mmol/L (ref 22–32)
Calcium: 8.7 mg/dL — ABNORMAL LOW (ref 8.9–10.3)
Chloride: 101 mmol/L (ref 98–111)
Creatinine, Ser: 0.35 mg/dL — ABNORMAL LOW (ref 0.44–1.00)
GFR, Estimated: >60 mL/min (ref >60)
Glucose, Bld: 96 mg/dL (ref 70–99)
Potassium: 3.1 mmol/L — ABNORMAL LOW (ref 3.5–5.1)
Sodium: 137 mmol/L (ref 135–145)

## 2021-02-09 LAB — MAGNESIUM: Magnesium: 2.1 mg/dL (ref 1.7–2.4)

## 2021-02-09 MED ORDER — POTASSIUM CHLORIDE CRYS ER 20 MEQ PO TBCR
30.0000 meq | EXTENDED_RELEASE_TABLET | ORAL | Status: AC
  Administered 2021-02-09 (×2): 30 meq via ORAL
  Filled 2021-02-09: qty 1

## 2021-02-09 MED ORDER — POTASSIUM CHLORIDE CRYS ER 20 MEQ PO TBCR: 60.0000 meq | EXTENDED_RELEASE_TABLET | Freq: Once | ORAL | Status: DC

## 2021-02-09 NOTE — TOC Progression Note (Addendum)
Transition of Care [facility]) - Progression Note    Patient Details  Name: Karen Krueger MRN: [id number] Date of Birth: 01-12-1932  Transition of Care [facility]) CM/SW [place], LCSW Phone Number: 02/09/2021, 9:30 AM  Clinical Narrative:  Disability paperwork completed and emailed back to financial counselor.   12:17 pm: Updated daughter about bed offers. She prefers [facility]. Asked admissions coordinator to have business office call daughter to start discussing financials. Person in background said right now they do not have any LTC beds but admissions coordinator will call her.  Expected Discharge Plan: [place] Barriers to Discharge: Continued Medical Work up  Expected Discharge Plan and Services Expected Discharge Plan: [place] Choice: [place] arrangements for the past 2 months: Single Family Home                                       Social Determinants of Health (SDOH) Interventions    Readmission Risk Interventions No flowsheet data found.

## 2021-02-09 NOTE — Progress Notes (Signed)
PROGRESS NOTE  [name]  DOB: [birth year]  PCP: Idelle Crouch, MD [id number]  DOA: 02/04/2021  LOS: 5 days  Hospital Day: 6   Chief Complaint  Patient presents with   Fever    Brief narrative: Karen Krueger is a 87 y.o. female with PMH significant for HTN, HLD, stroke, left eye blindness, dementia, glaucoma, UTI. Patient presented to the ED on 7/17 with complaint of fever, cough, shortness of breath, weakness. She lives with her daughter and son-in-law both of whom have COVID-19 infection. The day before admission, patient tested COVID positive at home.  In the ED, patient was noted to have a fever of 101.6, respiratory rate 22, hypoxic to 89% on room air and was started on 4 L oxygen by nasal cannula. Labs showed WBC count 4.4, lactic acid 0.9. Chest x-ray showed mild opacity in the lingula. Admitted to hospitalist service.  Subjective: Patient was seen and examined this morning.   IN bed. Not in distress.  Pleasant, demented.    Not on supplemental oxygen.  Breathing comfortably  Assessment/Plan: COVID pneumonia -Presented with shortness of breath, cough, fever -COVID test: COVID-positive on test at home. -Chest imaging: Mild opacity in the lingula -Treatment: completed 5 day course remdesevie and steroids - hypoxia resolved, now comfortable on room air  Advanced dementia - daughter unable to care for at home. Pending snf vs long term care placement. Bed search underway  HTN (hypertension) Bp normalized w/ increase in verapamil   History of CVA (cerebrovascular accident) -Aspirin, Plavix, statin to continue   Hypokalemia 3.1 today, will supplement, f/u mg  Recent Labs  Lab 02/04/21 1203 02/04/21 1210 02/05/21 0426 02/06/21 0519 02/07/21 0539 02/09/21 0641  K 3.3*  --  4.2 3.9 3.6 3.1*  MG  --  1.8  --   --   --   --    Mobility: PT/OT eval obtained. Code Status:   Code Status: DNR  Nutritional status: Body mass index is 19.53 kg/m.      Diet:  Diet Order             Diet heart healthy/carb modified Room service appropriate? Yes; Fluid consistency: Thin  Diet effective now                  DVT prophylaxis:  enoxaparin (LOVENOX) injection 40 mg Start: 02/04/21 1830   Antimicrobials: Currently not on IV antibiotics. Fluid: Not on IV fluid Consultants: None Family Communication: daughter updated telephonically 7/21  Status is: Inpatient  Dispo: The patient is from: Home              Anticipated d/c is to:  snf vs long term care               Patient currently is medically stable to d/c.   Difficult to place patient Yes     Infusions:     Scheduled Meds:  atorvastatin  40 mg Oral Daily   clopidogrel  75 mg Oral Daily   enoxaparin (LOVENOX) injection  40 mg Subcutaneous Q24H   feeding supplement  237 mL Oral BID BM   latanoprost  1 drop Left Eye QHS   timolol  1 drop Right Eye BID   verapamil  240 mg Oral Daily    Antimicrobials: Anti-infectives (From admission, onward)    Start     Dose/Rate Route Frequency Ordered Stop   02/05/21 1000  remdesivir 100 mg in sodium chloride 0.9 % 100 mL IVPB  See Hyperspace for full Linked Orders Report.   100 mg 200 mL/hr over 30 Minutes Intravenous Daily 02/04/21 1423 02/08/21 1013   02/04/21 1500  remdesivir 200 mg in sodium chloride 0.9% 250 mL IVPB       See Hyperspace for full Linked Orders Report.   200 mg 580 mL/hr over 30 Minutes Intravenous Once 02/04/21 1423 02/04/21 1640       PRN meds: acetaminophen, acetaminophen, albuterol, dextromethorphan-guaiFENesin, haloperidol **OR** haloperidol lactate, hydrALAZINE, ondansetron (ZOFRAN) IV   Objective: Vitals:   02/09/21 0445 02/09/21 0829  BP: (!) 149/84 136/65  Pulse: 72 65  Resp: 16 16  Temp: (!) 97.3 F (36.3 C) 98.2 F (36.8 C)  SpO2: 94% 92%    Intake/Output Summary (Last 24 hours) at 02/09/2021 1227 Last data filed at 02/09/2021 [id number] Gross per 24 hour  Intake 0 ml  Output 0 ml   Net 0 ml   Filed Weights   02/04/21 1159  Weight: 50 kg   Weight change:  Body mass index is 19.53 kg/m.   Physical Exam: General exam: Pleasant, elderly female.  Not in distress Skin: No rashes, lesions or ulcers. HEENT: Atraumatic, normocephalic, no obvious bleeding Lungs: Slow respiratory effort.  Clear to auscultation bilaterally CVS: Regular rate and rhythm, no murmur. GI/Abd soft, nontender, nondistended, bowel sound present CNS: Alert, awake, slow to respond, demented, disoriented Psychiatry: calm Extremities: No pedal edema,   Data Review: I have personally reviewed the laboratory data and studies available.  Recent Labs  Lab 02/04/21 1203 02/06/21 0519 02/07/21 0539  WBC 4.4 3.8* 7.8  NEUTROABS 1.5* 2.5 5.6  HGB 11.9* 12.3 12.7  HCT 36.7 37.8 37.9  MCV 92.7 92.0 89.4  PLT 143* 147* 158   Recent Labs  Lab 02/04/21 1203 02/04/21 1210 02/05/21 0426 02/06/21 0519 02/07/21 0539 02/09/21 0641  NA 134*  --  137 139 142 137  K 3.3*  --  4.2 3.9 3.6 3.1*  CL 101  --  105 103 102 101  CO2 29  --  '27 26 28 29  '$ GLUCOSE 93  --  101* 124* 111* 96  BUN 22  --  19 34* 33* 24*  CREATININE 0.42*  --  <0.30* 0.45 0.37* 0.35*  CALCIUM 8.6*  --  8.5* 8.7* 8.8* 8.7*  MG  --  1.8  --   --   --   --     F/u labs ordered Unresulted Labs (From admission, onward)     Start     Ordered   02/04/21 1744  Expectorated Sputum Assessment w Gram Stain, Rflx to Resp Cult  Once,   R        02/04/21 1744   02/04/21 1353  Resp Panel by RT-PCR (Flu A&B, Covid) Nasopharyngeal Swab  (Tier 2 - Symptomatic/asymptomatic with Precautions )  ONCE - STAT,   STAT       Question Answer Comment  Is this test for diagnosis or screening Diagnosis of ill patient   Symptomatic for COVID-19 as defined by CDC Yes   Date of Symptom Onset 02/03/2021   Hospitalized for COVID-19 Yes   Admitted to ICU for COVID-19 No   Previously tested for COVID-19 Yes   Resident in a congregate (group) care  setting Unknown   Employed in healthcare setting Unknown   Pregnant No   Has patient completed COVID vaccination(s) (2 doses of Pfizer/Moderna 1 dose of [employer]) Unknown      02/04/21 1352  Signed, Desma Maxim, MD [facility] 02/09/2021

## 2021-02-10 DIAGNOSIS — J1282 Pneumonia due to coronavirus disease 2019: Secondary | ICD-10-CM | POA: Diagnosis not present

## 2021-02-10 DIAGNOSIS — U071 COVID-19: Secondary | ICD-10-CM | POA: Diagnosis not present

## 2021-02-10 LAB — BASIC METABOLIC PANEL WITH GFR
Anion gap: 5 (ref 5–15)
BUN: 24 mg/dL — ABNORMAL HIGH (ref 8–23)
CO2: 30 mmol/L (ref 22–32)
Calcium: 8.7 mg/dL — ABNORMAL LOW (ref 8.9–10.3)
Chloride: 103 mmol/L (ref 98–111)
Creatinine, Ser: 0.46 mg/dL (ref 0.44–1.00)
GFR, Estimated: >60 mL/min
Glucose, Bld: 130 mg/dL — ABNORMAL HIGH (ref 70–99)
Potassium: 3.3 mmol/L — ABNORMAL LOW (ref 3.5–5.1)
Sodium: 138 mmol/L (ref 135–145)

## 2021-02-10 LAB — RESP PANEL BY RT-PCR (FLU A&B, COVID) ARPGX2
Influenza A by PCR: NEGATIVE
Influenza B by PCR: NEGATIVE
SARS Coronavirus 2 by RT PCR: POSITIVE — AB

## 2021-02-10 MED ORDER — POTASSIUM CHLORIDE CRYS ER 20 MEQ PO TBCR
40.0000 meq | EXTENDED_RELEASE_TABLET | Freq: Two times a day (BID) | ORAL | Status: AC
  Administered 2021-02-10 (×2): 40 meq via ORAL
  Filled 2021-02-10 (×2): qty 2

## 2021-02-10 NOTE — Progress Notes (Signed)
PROGRESS NOTE  Karen Krueger  DOB: 28-Sep-1931  PCP: Idelle Crouch, MD [id number]  DOA: 02/04/2021  LOS: 6 days  Hospital Day: 7   Chief Complaint  Patient presents with   Fever    Brief narrative: Karen Krueger is a 87 y.o. female with PMH significant for HTN, HLD, stroke, left eye blindness, dementia, glaucoma, UTI. Patient presented to the ED on 7/17 with complaint of fever, cough, shortness of breath, weakness. She lives with her daughter and son-in-law both of whom have COVID-19 infection. The day before admission, patient tested COVID positive at home.  In the ED, patient was noted to have a fever of 101.6, respiratory rate 22, hypoxic to 89% on room air and was started on 4 L oxygen by nasal cannula. Labs showed WBC count 4.4, lactic acid 0.9. Chest x-ray showed mild opacity in the lingula. Admitted to hospitalist service.  Subjective: Patient was seen and examined this morning.   IN bed. Not in distress.  Pleasant, demented.    Not on supplemental oxygen.  Breathing comfortably  Assessment/Plan: COVID pneumonia -Presented with shortness of breath, cough, fever -COVID test: COVID-positive on test at home. -Chest imaging: Mild opacity in the lingula -Treatment: completed 5 day course remdesevie and steroids - hypoxia resolved, now comfortable on room air  Advanced dementia - daughter unable to care for at home. Pending snf vs long term care placement. Bed search underway  HTN (hypertension) Bp normalized w/ increase in verapamil   History of CVA (cerebrovascular accident) -Aspirin, Plavix, statin to continue   Hypokalemia 3.1 yesterday, supplemented, mg wnl. Today's k pending  Recent Labs  Lab 02/04/21 1203 02/04/21 1210 02/05/21 0426 02/06/21 0519 02/07/21 0539 02/09/21 0641  K 3.3*  --  4.2 3.9 3.6 3.1*  MG  --  1.8  --   --   --  2.1   Mobility: PT/OT eval obtained. Code Status:   Code Status: DNR  Nutritional status: Body mass index is  19.53 kg/m.     Diet:  Diet Order             Diet heart healthy/carb modified Room service appropriate? Yes; Fluid consistency: Thin  Diet effective now                  DVT prophylaxis:  enoxaparin (LOVENOX) injection 40 mg Start: 02/04/21 1830   Antimicrobials: Currently not on IV antibiotics. Fluid: Not on IV fluid Consultants: None Family Communication: daughter updated telephonically 7/21  Status is: Inpatient  Dispo: The patient is from: Home              Anticipated d/c is to:  snf vs long term care               Patient currently is medically stable to d/c.   Difficult to place patient Yes     Infusions:     Scheduled Meds:  atorvastatin  40 mg Oral Daily   clopidogrel  75 mg Oral Daily   enoxaparin (LOVENOX) injection  40 mg Subcutaneous Q24H   feeding supplement  237 mL Oral BID BM   latanoprost  1 drop Left Eye QHS   timolol  1 drop Right Eye BID   verapamil  240 mg Oral Daily    Antimicrobials: Anti-infectives (From admission, onward)    Start     Dose/Rate Route Frequency Ordered Stop   02/05/21 1000  remdesivir 100 mg in sodium chloride 0.9 % 100 mL IVPB  See Hyperspace for full Linked Orders Report.   100 mg 200 mL/hr over 30 Minutes Intravenous Daily 02/04/21 1423 02/08/21 1013   02/04/21 1500  remdesivir 200 mg in sodium chloride 0.9% 250 mL IVPB       See Hyperspace for full Linked Orders Report.   200 mg 580 mL/hr over 30 Minutes Intravenous Once 02/04/21 1423 02/04/21 1640       PRN meds: acetaminophen, acetaminophen, albuterol, dextromethorphan-guaiFENesin, haloperidol **OR** haloperidol lactate, hydrALAZINE, ondansetron (ZOFRAN) IV   Objective: Vitals:   02/10/21 0420 02/10/21 0808  BP: (!) 141/75 124/68  Pulse: 74 73  Resp: 16 18  Temp: 98.1 F (36.7 C) 98.6 F (37 C)  SpO2: 91% 92%   No intake or output data in the 24 hours ending 02/10/21 1052  Filed Weights   02/04/21 1159  Weight: 50 kg   Weight  change:  Body mass index is 19.53 kg/m.   Physical Exam: General exam: Pleasant, elderly female.  Not in distress Skin: No rashes, lesions or ulcers. HEENT: Atraumatic, normocephalic, no obvious bleeding Lungs: Slow respiratory effort.  Clear to auscultation bilaterally CVS: Regular rate and rhythm, no murmur. GI/Abd soft, nontender, nondistended, bowel sound present CNS: Alert, awake, slow to respond, demented, disoriented Psychiatry: calm Extremities: No pedal edema,   Data Review: I have personally reviewed the laboratory data and studies available.  Recent Labs  Lab 02/04/21 1203 02/06/21 0519 02/07/21 0539  WBC 4.4 3.8* 7.8  NEUTROABS 1.5* 2.5 5.6  HGB 11.9* 12.3 12.7  HCT 36.7 37.8 37.9  MCV 92.7 92.0 89.4  PLT 143* 147* 158   Recent Labs  Lab 02/04/21 1203 02/04/21 1210 02/05/21 0426 02/06/21 0519 02/07/21 0539 02/09/21 0641  NA 134*  --  137 139 142 137  K 3.3*  --  4.2 3.9 3.6 3.1*  CL 101  --  105 103 102 101  CO2 29  --  '27 26 28 29  '$ GLUCOSE 93  --  101* 124* 111* 96  BUN 22  --  19 34* 33* 24*  CREATININE 0.42*  --  <0.30* 0.45 0.37* 0.35*  CALCIUM 8.6*  --  8.5* 8.7* 8.8* 8.7*  MG  --  1.8  --   --   --  2.1    F/u labs ordered Unresulted Labs (From admission, onward)     Start     Ordered   02/10/21 [id number]  Basic metabolic panel  Daily,   R     Question:  Specimen collection method  Answer:  Lab=Lab collect   02/10/21 1046   02/04/21 1744  Expectorated Sputum Assessment w Gram Stain, Rflx to Resp Cult  Once,   R        02/04/21 1744            Signed, Desma Maxim, MD [facility] 02/10/2021

## 2021-02-11 DIAGNOSIS — J1282 Pneumonia due to coronavirus disease 2019: Secondary | ICD-10-CM | POA: Diagnosis not present

## 2021-02-11 DIAGNOSIS — U071 COVID-19: Secondary | ICD-10-CM | POA: Diagnosis not present

## 2021-02-11 LAB — BASIC METABOLIC PANEL
Anion gap: 4 — ABNORMAL LOW (ref 5–15)
BUN: 21 mg/dL (ref 8–23)
CO2: 29 mmol/L (ref 22–32)
Calcium: 8.5 mg/dL — ABNORMAL LOW (ref 8.9–10.3)
Chloride: 105 mmol/L (ref 98–111)
Creatinine, Ser: 0.4 mg/dL — ABNORMAL LOW (ref 0.44–1.00)
GFR, Estimated: >60 mL/min (ref >60)
Glucose, Bld: 94 mg/dL (ref 70–99)
Potassium: 4.5 mmol/L (ref 3.5–5.1)
Sodium: 138 mmol/L (ref 135–145)

## 2021-02-11 NOTE — Progress Notes (Signed)
Physical Therapy Treatment Patient Details Name: Karen Krueger MRN: [id number] DOB: [birth year] Today's Date: 02/11/2021    History of Present Illness Pt is a 87 y.o. female with medical history significant of hypertension, hyperlipidemia, stroke, TIA, left eye blindness, dementia, glaucoma, UTI, who presents with fever, cough, shortness of breath and weakness.    PT Comments    Pt seen for PT tx with pt lethargic throughout session with PT attempting to increase alertness with multimodal cuing with pt requiring max cuing to open eyes. Pt requires max<>total assist for supine<>sit and min assist for static sitting balance. PT attempts to facilitate sit>stand from EOB but pt does not attempt to bear weight through BLE & reports "I cannot stand" despite multiple attempts from PT. PT assisted pt back to bed & pt drifting off to sleep.     Follow Up Recommendations  Home health PT;Supervision/Assistance - 24 hour     Equipment Recommendations  None recommended by PT    Recommendations for Other Services       Precautions / Restrictions Precautions Precautions: Fall Restrictions Weight Bearing Restrictions: No    Mobility  Bed Mobility Overal bed mobility: Needs Assistance Bed Mobility: Supine to Sit;Sit to Supine     Supine to sit: Max assist;Total assist;HOB elevated Sit to supine: Max assist;Total assist        Transfers                    Ambulation/Gait                 Stairs             Wheelchair Mobility    Modified Rankin (Stroke Patients Only)       Balance                                            Cognition Arousal/Alertness: Lethargic Behavior During Therapy: WFL for tasks assessed/performed Overall Cognitive Status: History of cognitive impairments - at baseline                                 General Comments: Pt follows 1 step commands inconsistently with extra time with multimodal cuing.       Exercises      General Comments        Pertinent Vitals/Pain Pain Assessment: Faces Faces Pain Scale: No hurt    Home Living                      Prior Function            PT Goals (current goals can now be found in the care plan section) Acute Rehab PT Goals Patient Stated Goal: none stated PT Goal Formulation: Patient unable to participate in goal setting Time For Goal Achievement: 02/19/21 Potential to Achieve Goals: Fair Progress towards PT goals: Progressing toward goals    Frequency    Min 2X/week      PT Plan Current plan remains appropriate    Co-evaluation              AM-PAC PT "6 Clicks" Mobility   Outcome Measure  Help needed turning from your back to your side while in a flat bed without using bedrails?: A Lot Help needed moving from lying on  your back to sitting on the side of a flat bed without using bedrails?: Total Help needed moving to and from a bed to a chair (including a wheelchair)?: Total Help needed standing up from a chair using your arms (e.g., wheelchair or bedside chair)?: Total Help needed to walk in hospital room?: Total Help needed climbing 3-5 steps with a railing? : Total 6 Click Score: 7    End of Session   Activity Tolerance: Patient limited by lethargy Patient left: in bed;with call bell/phone within reach;with bed alarm set   PT Visit Diagnosis: Other abnormalities of gait and mobility (R26.89);Muscle weakness (generalized) (M62.81);Difficulty in walking, not elsewhere classified (R26.2)     Time: [id number] PT Time Calculation (min) (ACUTE ONLY): 9 min  Charges:  $Therapeutic Activity: 8-22 mins                     Lavone Nian, PT, DPT 02/11/21, 1:26 PM    Waunita Schooner 02/11/2021, 1:24 PM

## 2021-02-11 NOTE — TOC Progression Note (Signed)
Transition of Care [facility]) - Progression Note    Patient Details  Name: Karen Krueger MRN: [id number] Date of Birth: 04-03-1932  Transition of Care [facility]) CM/SW [place], LCSW Phone Number: 02/11/2021, 11:59 AM  Clinical Narrative:   Insurance authorization started in [facility] portal. Insurance may need updated PT note. Notified PT.    Expected Discharge Plan: [place] Barriers to Discharge: Continued Medical Work up  Expected Discharge Plan and Services Expected Discharge Plan: [place] Choice: [place] arrangements for the past 2 months: Single Family Home                                       Social Determinants of Health (SDOH) Interventions    Readmission Risk Interventions No flowsheet data found.

## 2021-02-11 NOTE — Progress Notes (Signed)
PROGRESS NOTE  Karen Krueger  DOB: Oct 20, 1931  PCP: Idelle Crouch, MD [id number]  DOA: 02/04/2021  LOS: 7 days  Hospital Day: 8   Chief Complaint  Patient presents with   Fever    Brief narrative: Karen Krueger is a 87 y.o. female with PMH significant for HTN, HLD, stroke, left eye blindness, dementia, glaucoma, UTI. Patient presented to the ED on 7/17 with complaint of fever, cough, shortness of breath, weakness. She lives with her daughter and son-in-law both of whom have COVID-19 infection. The day before admission, patient tested COVID positive at home.  In the ED, patient was noted to have a fever of 101.6, respiratory rate 22, hypoxic to 89% on room air and was started on 4 L oxygen by nasal cannula. Labs showed WBC count 4.4, lactic acid 0.9. Chest x-ray showed mild opacity in the lingula. Admitted to hospitalist service.  Subjective: Patient was seen and examined this morning.   IN bed. Not in distress.  Pleasant, demented.    Not on supplemental oxygen.  Breathing comfortably  Assessment/Plan: COVID pneumonia -Presented with shortness of breath, cough, fever -COVID test: COVID-positive on test at home. -Chest imaging: Mild opacity in the lingula -Treatment: completed 5 day course remdesevie and steroids - hypoxia resolved, now comfortable on room air  Advanced dementia - daughter unable to care for at home. Pending snf vs long term care placement. Bed search underway  HTN (hypertension) Bp normalized w/ increase in verapamil   History of CVA (cerebrovascular accident) -Aspirin, Plavix, statin to continue   Hypokalemia Resolved w/ supplementation. Will monitor qod  Recent Labs  Lab 02/04/21 1210 02/05/21 0426 02/06/21 0519 02/07/21 0539 02/09/21 0641 02/10/21 1113 02/11/21 0416  K  --    < > 3.9 3.6 3.1* 3.3* 4.5  MG 1.8  --   --   --  2.1  --   --    < > = values in this interval not displayed.   Mobility: PT/OT eval obtained. Code Status:    Code Status: DNR  Nutritional status: Body mass index is 19.53 kg/m.     Diet:  Diet Order             Diet heart healthy/carb modified Room service appropriate? Yes; Fluid consistency: Thin  Diet effective now                  DVT prophylaxis:  enoxaparin (LOVENOX) injection 40 mg Start: 02/04/21 1830   Antimicrobials: Currently not on IV antibiotics. Fluid: Not on IV fluid Consultants: None Family Communication: daughter updated telephonically 7/21  Status is: Inpatient  Dispo: The patient is from: Home              Anticipated d/c is to:  snf vs long term care               Patient currently is medically stable to d/c.   Difficult to place patient Yes     Infusions:     Scheduled Meds:  atorvastatin  40 mg Oral Daily   clopidogrel  75 mg Oral Daily   enoxaparin (LOVENOX) injection  40 mg Subcutaneous Q24H   feeding supplement  237 mL Oral BID BM   latanoprost  1 drop Left Eye QHS   timolol  1 drop Right Eye BID   verapamil  240 mg Oral Daily    Antimicrobials: Anti-infectives (From admission, onward)    Start     Dose/Rate Route Frequency Ordered  Stop   02/05/21 1000  remdesivir 100 mg in sodium chloride 0.9 % 100 mL IVPB       See Hyperspace for full Linked Orders Report.   100 mg 200 mL/hr over 30 Minutes Intravenous Daily 02/04/21 1423 02/08/21 1013   02/04/21 1500  remdesivir 200 mg in sodium chloride 0.9% 250 mL IVPB       See Hyperspace for full Linked Orders Report.   200 mg 580 mL/hr over 30 Minutes Intravenous Once 02/04/21 1423 02/04/21 1640       PRN meds: acetaminophen, acetaminophen, albuterol, dextromethorphan-guaiFENesin, haloperidol **OR** haloperidol lactate, hydrALAZINE, ondansetron (ZOFRAN) IV   Objective: Vitals:   02/10/21 1604 02/11/21 0809  BP: 124/66 (!) 148/92  Pulse: 79 90  Resp: 18 18  Temp: 99.2 F (37.3 C) 99.1 F (37.3 C)  SpO2: 93% 93%   No intake or output data in the 24 hours ending 02/11/21  1205  Filed Weights   02/04/21 1159  Weight: 50 kg   Weight change:  Body mass index is 19.53 kg/m.   Physical Exam: General exam: Pleasant, elderly female.  Not in distress Skin: No rashes, lesions or ulcers. HEENT: Atraumatic, normocephalic, no obvious bleeding Lungs: Slow respiratory effort.  Clear to auscultation bilaterally CVS: Regular rate and rhythm, no murmur. GI/Abd soft, nontender, nondistended, bowel sound present CNS: Alert, awake, slow to respond, demented, disoriented Psychiatry: calm Extremities: No pedal edema,   Data Review: I have personally reviewed the laboratory data and studies available.  Recent Labs  Lab 02/06/21 0519 02/07/21 0539  WBC 3.8* 7.8  NEUTROABS 2.5 5.6  HGB 12.3 12.7  HCT 37.8 37.9  MCV 92.0 89.4  PLT 147* 158   Recent Labs  Lab 02/04/21 1210 02/05/21 0426 02/06/21 0519 02/07/21 0539 02/09/21 0641 02/10/21 1113 02/11/21 0416  NA  --    < > 139 142 137 138 138  K  --    < > 3.9 3.6 3.1* 3.3* 4.5  CL  --    < > 103 102 101 103 105  CO2  --    < > '26 28 29 30 29  '$ GLUCOSE  --    < > 124* 111* 96 130* 94  BUN  --    < > 34* 33* 24* 24* 21  CREATININE  --    < > 0.45 0.37* 0.35* 0.46 0.40*  CALCIUM  --    < > 8.7* 8.8* 8.7* 8.7* 8.5*  MG 1.8  --   --   --  2.1  --   --    < > = values in this interval not displayed.    F/u labs ordered Unresulted Labs (From admission, onward)     Start     Ordered   02/10/21 123456  Basic metabolic panel  Daily,   R     Question:  Specimen collection method  Answer:  Lab=Lab collect   02/10/21 1046            Signed, Desma Maxim, MD [facility] 02/11/2021

## 2021-02-12 DIAGNOSIS — J1282 Pneumonia due to coronavirus disease 2019: Secondary | ICD-10-CM | POA: Diagnosis not present

## 2021-02-12 DIAGNOSIS — U071 COVID-19: Secondary | ICD-10-CM | POA: Diagnosis not present

## 2021-02-12 NOTE — TOC Progression Note (Signed)
Transition of Care [facility]) - Progression Note    Patient Details  Name: Karen Krueger MRN: [id number] Date of Birth: 02-09-1932  Transition of Care [facility]) CM/SW Contact  Beverly Sessions, RN Phone Number: 02/12/2021, 2:45 PM  Clinical Narrative:    SNF auth approved 7/26-7/28  Due to patient testing covid positive at home on 7/16, per Tammy and Levada Dy the earliest she can come to [facility] is 7/28.  MD updated  VM left for daughter left to update   Expected Discharge Plan: [place] Barriers to Discharge: Continued Medical Work up  Expected Discharge Plan and Services Expected Discharge Plan: [place] Choice: [place] arrangements for the past 2 months: Single Family Home                                       Social Determinants of Health (SDOH) Interventions    Readmission Risk Interventions No flowsheet data found.

## 2021-02-12 NOTE — Progress Notes (Signed)
Occupational Therapy Treatment Patient Details Name: Karen Krueger MRN: [id number] DOB: 11-20-1931 Today's Date: 02/12/2021    History of present illness Pt is a 87 y.o. female with medical history significant of hypertension, hyperlipidemia, stroke, TIA, left eye blindness, dementia, glaucoma, UTI, who presents with fever, cough, shortness of breath and weakness.   OT comments  Pt seen for OT treatment on this date. Upon arrival to room, pt asleep however easily awoken. Pt alert and oriented to self only, however reporting no pain. Pt continues to present with impaired cognition, decreased strength, decreased balance, and decreased activity tolerance, and requires MOD-MAX A for bed mobility, MOD A for LB dressing, MOD A for grooming tasks, and MAX A for squat pivot transfer bed>chair. Pt is making good progress toward goals and continues to benefit from skilled OT services to maximize return to PLOF and minimize risk of future falls, injury, caregiver burden, and readmission. Will continue to follow POC. Discharge recommendation remains appropriate.     Follow Up Recommendations  Home health OT;Supervision/Assistance - 24 hour    Equipment Recommendations  None recommended by OT       Precautions / Restrictions Precautions Precautions: Fall Restrictions Weight Bearing Restrictions: No       Mobility Bed Mobility Overal bed mobility: Needs Assistance Bed Mobility: Supine to Sit;Sit to Supine     Supine to sit: Max assist;HOB elevated Sit to supine: Mod assist        Transfers Overall transfer level: Needs assistance   Transfers: Sit to/from Stand;Squat Pivot Transfers Sit to Stand: Max assist Stand pivot transfers: Max assist       General transfer comment: requires multi-modal cues for body positioning    Balance Overall balance assessment: Needs assistance Sitting-balance support: Feet supported;No upper extremity supported Sitting balance-Leahy Scale: Fair Sitting  balance - Comments: fair sitting balance at EOB   Standing balance support: No upper extremity supported;During functional activity Standing balance-Leahy Scale: Zero Standing balance comment: MAX A to maintain standing balance                           ADL either performed or assessed with clinical judgement   ADL Overall ADL's : Needs assistance/impaired     Grooming: Brushing hair;Moderate assistance;Sitting Grooming Details (indicate cue type and reason): Pt able to bring comb to R side of head while sitting EOB. MOD A required for thoroughness             Lower Body Dressing: Moderate assistance;Bed level Lower Body Dressing Details (indicate cue type and reason): MOD A to initiate donning socks, with pt able to pull over ankles             Functional mobility during ADLs: Maximal assistance        Cognition Arousal/Alertness: Lethargic Behavior During Therapy: WFL for tasks assessed/performed Overall Cognitive Status: History of cognitive impairments - at baseline                                 General Comments: Pt follows 1 step commands inconsistently with extra time with multimodal cuing.                   Pertinent Vitals/ Pain       Pain Assessment: No/denies pain         Frequency  Min 2X/week  Progress Toward Goals  OT Goals(current goals can now be found in the care plan section)  Progress towards OT goals: Progressing toward goals  Acute Rehab OT Goals Patient Stated Goal: none stated OT Goal Formulation: With patient Time For Goal Achievement: 02/19/21  Plan Discharge plan remains appropriate;Frequency remains appropriate       AM-PAC OT "6 Clicks" Daily Activity     Outcome Measure   Help from another person eating meals?: A Little Help from another person taking care of personal grooming?: A Little Help from another person toileting, which includes using toliet, bedpan, or urinal?: A  Lot Help from another person bathing (including washing, rinsing, drying)?: A Lot Help from another person to put on and taking off regular upper body clothing?: A Little Help from another person to put on and taking off regular lower body clothing?: A Lot 6 Click Score: 15    End of Session Equipment Utilized During Treatment: Gait belt;Rolling walker  OT Visit Diagnosis: Unsteadiness on feet (R26.81);Muscle weakness (generalized) (M62.81)   Activity Tolerance Patient tolerated treatment well   Patient Left in chair;with chair alarm set;with call bell/phone within reach   Nurse Communication Mobility status        Time: [id number] OT Time Calculation (min): 28 min  Charges: OT General Charges $OT Visit: 1 Visit OT Treatments $Self Care/Home Management : 23-37 mins  Fredirick Maudlin, OTR/L [place]

## 2021-02-12 NOTE — Progress Notes (Addendum)
PROGRESS NOTE  Karen Krueger  DOB: [birth year]  PCP: Idelle Crouch, MD [id number]  DOA: 02/04/2021  LOS: 8 days  Hospital Day: 9   Chief Complaint  Patient presents with   Fever    Brief narrative: Karen Krueger is a 86 y.o. female with PMH significant for HTN, HLD, stroke, left eye blindness, dementia, glaucoma, UTI. Patient presented to the ED on 7/17 with complaint of fever, cough, shortness of breath, weakness. She lives with her daughter and son-in-law both of whom have COVID-19 infection. The day before admission, patient tested COVID positive at home.  In the ED, patient was noted to have a fever of 101.6, respiratory rate 22, hypoxic to 89% on room air and was started on 4 L oxygen by nasal cannula. Labs showed WBC count 4.4, lactic acid 0.9. Chest x-ray showed mild opacity in the lingula. Admitted to hospitalist service.  Subjective: Patient was seen and examined this morning.   IN bed. Not in distress.  Pleasant, demented.    Not on supplemental oxygen.  Breathing comfortably  Assessment/Plan: COVID pneumonia -Presented with shortness of breath, cough, fever -COVID test: COVID-positive on test at home. -Chest imaging: Mild opacity in the lingula -Treatment: completed 5 day course remdesevie and steroids - hypoxia resolved, now comfortable on room air  Advanced dementia - daughter unable to care for at home. Pending snf vs long term care placement. Bed search underway  HTN (hypertension) Bp normalized w/ increase in verapamil   History of CVA (cerebrovascular accident) -Aspirin, Plavix, statin to continue   Hypokalemia Resolved w/ supplementation. Will monitor qod  Recent Labs  Lab 02/06/21 0519 02/07/21 0539 02/09/21 0641 02/10/21 1113 02/11/21 0416  K 3.9 3.6 3.1* 3.3* 4.5  MG  --   --  2.1  --   --    Mobility: PT/OT eval obtained, advising home health pt Code Status:   Code Status: DNR  Nutritional status: Body mass index is 19.53  kg/m.     Diet:  Diet Order             Diet heart healthy/carb modified Room service appropriate? Yes; Fluid consistency: Thin  Diet effective now                  DVT prophylaxis:  enoxaparin (LOVENOX) injection 40 mg Start: 02/04/21 1830   Antimicrobials: Currently not on IV antibiotics. Fluid: Not on IV fluid Consultants: None Family Communication: daughter updated telephonically 7/25  Status is: Inpatient  Dispo: The patient is from: Home              Anticipated d/c is to:  snf vs long term care               Patient currently is medically stable to d/c.   Difficult to place patient Yes     Infusions:     Scheduled Meds:  atorvastatin  40 mg Oral Daily   clopidogrel  75 mg Oral Daily   enoxaparin (LOVENOX) injection  40 mg Subcutaneous Q24H   feeding supplement  237 mL Oral BID BM   latanoprost  1 drop Left Eye QHS   timolol  1 drop Right Eye BID   verapamil  240 mg Oral Daily    Antimicrobials: Anti-infectives (From admission, onward)    Start     Dose/Rate Route Frequency Ordered Stop   02/05/21 1000  remdesivir 100 mg in sodium chloride 0.9 % 100 mL IVPB  See Hyperspace for full Linked Orders Report.   100 mg 200 mL/hr over 30 Minutes Intravenous Daily 02/04/21 1423 02/08/21 1013   02/04/21 1500  remdesivir 200 mg in sodium chloride 0.9% 250 mL IVPB       See Hyperspace for full Linked Orders Report.   200 mg 580 mL/hr over 30 Minutes Intravenous Once 02/04/21 1423 02/04/21 1640       PRN meds: acetaminophen, acetaminophen, albuterol, dextromethorphan-guaiFENesin, hydrALAZINE, ondansetron (ZOFRAN) IV   Objective: Vitals:   02/12/21 0518 02/12/21 0911  BP: 127/73 134/75  Pulse: 68 78  Resp: 16 16  Temp: 97.6 F (36.4 C) 98.6 F (37 C)  SpO2: 90% 94%    Intake/Output Summary (Last 24 hours) at 02/12/2021 1332 Last data filed at 02/12/2021 1306 Gross per 24 hour  Intake 360 ml  Output --  Net 360 ml    Filed Weights    02/04/21 1159  Weight: 50 kg   Weight change:  Body mass index is 19.53 kg/m.   Physical Exam: General exam: Pleasant, elderly female.  Not in distress Skin: No rashes, lesions or ulcers. HEENT: Atraumatic, normocephalic, no obvious bleeding Lungs: Slow respiratory effort.  Clear to auscultation bilaterally CVS: Regular rate and rhythm, no murmur. GI/Abd soft, nontender, nondistended, bowel sound present CNS: Alert, awake, slow to respond, demented, disoriented Psychiatry: calm Extremities: No pedal edema,   Data Review: I have personally reviewed the laboratory data and studies available.  Recent Labs  Lab 02/06/21 0519 02/07/21 0539  WBC 3.8* 7.8  NEUTROABS 2.5 5.6  HGB 12.3 12.7  HCT 37.8 37.9  MCV 92.0 89.4  PLT 147* 158   Recent Labs  Lab 02/06/21 0519 02/07/21 0539 02/09/21 0641 02/10/21 1113 02/11/21 0416  NA 139 142 137 138 138  K 3.9 3.6 3.1* 3.3* 4.5  CL 103 102 101 103 105  CO2 '26 28 29 30 29  '$ GLUCOSE 124* 111* 96 130* 94  BUN 34* 33* 24* 24* 21  CREATININE 0.45 0.37* 0.35* 0.46 0.40*  CALCIUM 8.7* 8.8* 8.7* 8.7* 8.5*  MG  --   --  2.1  --   --     F/u labs ordered Unresulted Labs (From admission, onward)    None       Signed, Desma Maxim, MD [facility] 02/12/2021

## 2021-02-13 ENCOUNTER — Inpatient Hospital Stay: Payer: Medicare Other

## 2021-02-13 DIAGNOSIS — U071 COVID-19: Secondary | ICD-10-CM | POA: Diagnosis not present

## 2021-02-13 DIAGNOSIS — J1282 Pneumonia due to coronavirus disease 2019: Secondary | ICD-10-CM | POA: Diagnosis not present

## 2021-02-13 LAB — BASIC METABOLIC PANEL
Anion gap: 7 (ref 5–15)
BUN: 31 mg/dL — ABNORMAL HIGH (ref 8–23)
CO2: 30 mmol/L (ref 22–32)
Calcium: 8.7 mg/dL — ABNORMAL LOW (ref 8.9–10.3)
Chloride: 102 mmol/L (ref 98–111)
Creatinine, Ser: 0.53 mg/dL (ref 0.44–1.00)
GFR, Estimated: >60 mL/min (ref >60)
Glucose, Bld: 102 mg/dL — ABNORMAL HIGH (ref 70–99)
Potassium: 3.8 mmol/L (ref 3.5–5.1)
Sodium: 139 mmol/L (ref 135–145)

## 2021-02-13 LAB — D-DIMER, QUANTITATIVE: D-Dimer, Quant: 0.75 ug/mL-FEU — ABNORMAL HIGH (ref 0.00–0.50)

## 2021-02-13 LAB — TROPONIN I (HIGH SENSITIVITY): Troponin I (High Sensitivity): 12 ng/L (ref <18)

## 2021-02-13 LAB — BRAIN NATRIURETIC PEPTIDE: B Natriuretic Peptide: 97.3 pg/mL (ref 0.0–100.0)

## 2021-02-13 NOTE — Progress Notes (Signed)
PROGRESS NOTE  Karen Krueger  DOB: 1931/12/05  PCP: Idelle Crouch, MD [id number]  DOA: 02/04/2021  LOS: 9 days  Hospital Day: 69   Chief Complaint  Patient presents with   Fever    Brief narrative: Karen Krueger is a 87 y.o. female with PMH significant for HTN, HLD, stroke, left eye blindness, dementia, glaucoma, UTI. Patient presented to the ED on 7/17 with complaint of fever, cough, shortness of breath, weakness. She lives with her daughter and son-in-law both of whom have COVID-19 infection. The day before admission, patient tested COVID positive at home.  In the ED, patient was noted to have a fever of 101.6, respiratory rate 22, hypoxic to 89% on room air and was started on 4 L oxygen by nasal cannula. Labs showed WBC count 4.4, lactic acid 0.9. Chest x-ray showed mild opacity in the lingula. Admitted to hospitalist service.  Subjective: Patient was seen and examined this morning.   IN bed. Not in distress.  Pleasant, demented.    No cough  Assessment/Plan: COVID pneumonia -Presented with shortness of breath, cough, fever -COVID test: COVID-positive on test at home. -Chest imaging: Mild opacity in the lingula -Treatment: completed 5 day course remdesevie and steroids  Hypoxia O2 upper 80s, resolved with [place] o2. No tachypnea. Lungs clear - will check cxr, dimer, trop.   Pelvic pain Yesterday and today complain of pain around coccyx. No ulcer - will check x-ray  Advanced dementia - daughter unable to care for at home. Pending snf vs long term care placement. Bed search underway, has one at [facility], looks like earliest can d/c there is 7/28  HTN (hypertension) Bp normalized w/ increase in verapamil   History of CVA (cerebrovascular accident) -Aspirin, Plavix, statin to continue   Hypokalemia Resolved w/ supplementation. Will monitor qod  Recent Labs  Lab 02/07/21 0539 02/09/21 0641 02/10/21 1113 02/11/21 0416 02/13/21 0523  K 3.6 3.1* 3.3* 4.5 3.8   MG  --  2.1  --   --   --    Mobility: PT/OT eval obtained, advising home health pt Code Status:   Code Status: DNR  Nutritional status: Body mass index is 19.53 kg/m.     Diet:  Diet Order             Diet heart healthy/carb modified Room service appropriate? Yes; Fluid consistency: Thin  Diet effective now                  DVT prophylaxis:  enoxaparin (LOVENOX) injection 40 mg Start: 02/04/21 1830   Antimicrobials: Currently not on IV antibiotics. Fluid: Not on IV fluid Consultants: None Family Communication: daughter updated telephonically 7/25  Status is: Inpatient  Dispo: The patient is from: Home              Anticipated d/c is to:  snf               Patient currently is medically stable to d/c.   Difficult to place patient Yes     Infusions:     Scheduled Meds:  atorvastatin  40 mg Oral Daily   clopidogrel  75 mg Oral Daily   enoxaparin (LOVENOX) injection  40 mg Subcutaneous Q24H   feeding supplement  237 mL Oral BID BM   latanoprost  1 drop Left Eye QHS   timolol  1 drop Right Eye BID   verapamil  240 mg Oral Daily    Antimicrobials: Anti-infectives (From admission, onward)  Start     Dose/Rate Route Frequency Ordered Stop   02/05/21 1000  remdesivir 100 mg in sodium chloride 0.9 % 100 mL IVPB       See Hyperspace for full Linked Orders Report.   100 mg 200 mL/hr over 30 Minutes Intravenous Daily 02/04/21 1423 02/08/21 1013   02/04/21 1500  remdesivir 200 mg in sodium chloride 0.9% 250 mL IVPB       See Hyperspace for full Linked Orders Report.   200 mg 580 mL/hr over 30 Minutes Intravenous Once 02/04/21 1423 02/04/21 1640       PRN meds: acetaminophen, acetaminophen, albuterol, dextromethorphan-guaiFENesin, hydrALAZINE, ondansetron (ZOFRAN) IV   Objective: Vitals:   02/13/21 0828 02/13/21 1100  BP: 128/72   Pulse: 73   Resp: 16   Temp: 98.9 F (37.2 C)   SpO2: (!) 89% 94%    Intake/Output Summary (Last 24 hours) at  02/13/2021 1259 Last data filed at 02/12/2021 1757 Gross per 24 hour  Intake 480 ml  Output --  Net 480 ml    Filed Weights   02/04/21 1159  Weight: 50 kg   Weight change:  Body mass index is 19.53 kg/m.   Physical Exam: General exam: Pleasant, elderly female.  Not in distress Skin: No rashes, lesions or ulcers. HEENT: Atraumatic, normocephalic, no obvious bleeding Lungs: Slow respiratory effort.  Clear to auscultation bilaterally CVS: Regular rate and rhythm, no murmur. GI/Abd soft, nontender, nondistended, bowel sound present CNS: Alert, awake, slow to respond, demented, disoriented Psychiatry: calm Extremities: No pedal edema,   Data Review: I have personally reviewed the laboratory data and studies available.  Recent Labs  Lab 02/07/21 0539  WBC 7.8  NEUTROABS 5.6  HGB 12.7  HCT 37.9  MCV 89.4  PLT 158   Recent Labs  Lab 02/07/21 0539 02/09/21 0641 02/10/21 1113 02/11/21 0416 02/13/21 0523  NA 142 137 138 138 139  K 3.6 3.1* 3.3* 4.5 3.8  CL 102 101 103 105 102  CO2 '28 29 30 29 30  '$ GLUCOSE 111* 96 130* 94 102*  BUN 33* 24* 24* 21 31*  CREATININE 0.37* 0.35* 0.46 0.40* 0.53  CALCIUM 8.8* 8.7* 8.7* 8.5* 8.7*  MG  --  2.1  --   --   --     F/u labs ordered Unresulted Labs (From admission, onward)     Start     Ordered   02/13/21 1259  D-dimer, quantitative  Once,   R        02/13/21 1258            Signed, Desma Maxim, MD [facility] 02/13/2021

## 2021-02-13 NOTE — TOC Progression Note (Signed)
Transition of Care [facility]) - Progression Note    Patient Details  Name: Karen Krueger MRN: [id number] Date of Birth: 1931/09/19  [facility]) CM/SW Contact  Beverly Sessions, RN Phone Number: 02/13/2021, 3:31 PM  Clinical Narrative:     Plan remains for discharge to [facility] 7/28  Expected Discharge Plan: [place] Barriers to Discharge: Continued Medical Work up  Expected Discharge Plan and Services Expected Discharge Plan: [place] Choice: [place] arrangements for the past 2 months: Single Family Home                                       Social Determinants of Health (SDOH) Interventions    Readmission Risk Interventions No flowsheet data found.

## 2021-02-14 DIAGNOSIS — J1282 Pneumonia due to coronavirus disease 2019: Secondary | ICD-10-CM | POA: Diagnosis not present

## 2021-02-14 DIAGNOSIS — U071 COVID-19: Secondary | ICD-10-CM | POA: Diagnosis not present

## 2021-02-14 MED ORDER — GUAIFENESIN-DM 100-10 MG/5ML PO SYRP
5.0000 mL | ORAL_SOLUTION | ORAL | Status: DC | PRN
  Administered 2021-02-14 – 2021-02-15 (×3): 5 mL via ORAL
  Filled 2021-02-14 (×3): qty 5

## 2021-02-14 NOTE — Care Management Important Message (Signed)
Important Message  Patient Details  Name: Karen Krueger MRN: [id number] Date of Birth: 05/20/1932   Medicare Important Message Given:  Other (see comment)  Left voicemail for [name], daughter, at [phone] to review Medicare IM.  Encouraged callback.     Dannette Barbara 02/14/2021, 9:25 AM

## 2021-02-14 NOTE — Progress Notes (Signed)
PROGRESS NOTE  Karen Krueger  DOB: 21-Feb-1932  PCP: Idelle Crouch, MD [id number]  DOA: 02/04/2021  LOS: 10 days  Hospital Day: 11   Chief Complaint  Patient presents with   Fever    Brief narrative: Karen Krueger is a 87 y.o. female with PMH significant for HTN, HLD, stroke, left eye blindness, dementia, glaucoma, UTI. Patient presented to the ED on 7/17 with complaint of fever, cough, shortness of breath, weakness. She lives with her daughter and son-in-law both of whom have COVID-19 infection. The day before admission, patient tested COVID positive at home.  In the ED, patient was noted to have a fever of 101.6, respiratory rate 22, hypoxic to 89% on room air and was started on 4 L oxygen by nasal cannula. Labs showed WBC count 4.4, lactic acid 0.9. Chest x-ray showed mild opacity in the lingula. Admitted to hospitalist service.  Subjective: Patient was seen and examined this morning.   Propped up in bed.  Not in distress.  Waiting for SNF  Assessment/Plan: COVID pneumonia Acute respiratory failure with hypoxia -Presented with shortness of breath, cough, fever -COVID test: COVID-positive on test at home. -Chest imaging: Mild opacity in the lingula -Treatment: Currently on a 5-day course of IV remdesivir and steroids.   -Continue 2 L oxygen by nasal cannula  HTN (hypertension) -Blood pressure improved with increasing verapamil dose to 240 mg daily.   History of CVA (cerebrovascular accident) -Aspirin, Plavix, statin to continue  Advanced dementia -Daughter is unable to take care at home.  SNF placement planned.  Bed search underway.  10 days of isolation to be over on 7/28.  Mobility: PT/OT eval obtained. Code Status:   Code Status: DNR  Nutritional status: Body mass index is 19.53 kg/m.     Diet:  Diet Order             Diet heart healthy/carb modified Room service appropriate? Yes; Fluid consistency: Thin  Diet effective now                   DVT prophylaxis:  enoxaparin (LOVENOX) injection 40 mg Start: 02/04/21 1830   Antimicrobials: Completed course of antibiotics Fluid: Not on IV fluid Consultants: None Family Communication: None at bedside  Status is: Inpatient  Remains inpatient appropriate because: Pending SNF  Dispo: The patient is from: Home              Anticipated d/c is to: PT eval obtained.  SNF placement waited for              patient currently is medically stable to d/c.   Difficult to place patient No     Infusions:     Scheduled Meds:  atorvastatin  40 mg Oral Daily   clopidogrel  75 mg Oral Daily   enoxaparin (LOVENOX) injection  40 mg Subcutaneous Q24H   feeding supplement  237 mL Oral BID BM   latanoprost  1 drop Left Eye QHS   timolol  1 drop Right Eye BID   verapamil  240 mg Oral Daily    Antimicrobials: Anti-infectives (From admission, onward)    Start     Dose/Rate Route Frequency Ordered Stop   02/05/21 1000  remdesivir 100 mg in sodium chloride 0.9 % 100 mL IVPB       See Hyperspace for full Linked Orders Report.   100 mg 200 mL/hr over 30 Minutes Intravenous Daily 02/04/21 1423 02/08/21 1013   02/04/21 1500  remdesivir  200 mg in sodium chloride 0.9% 250 mL IVPB       See Hyperspace for full Linked Orders Report.   200 mg 580 mL/hr over 30 Minutes Intravenous Once 02/04/21 1423 02/04/21 1640       PRN meds: acetaminophen, acetaminophen, albuterol, dextromethorphan-guaiFENesin, guaiFENesin-dextromethorphan, hydrALAZINE, ondansetron (ZOFRAN) IV   Objective: Vitals:   02/14/21 0255 02/14/21 0826  BP: 103/63 104/67  Pulse: 79 80  Resp: 16 18  Temp: 98.7 F (37.1 C) 98.6 F (37 C)  SpO2: 94% 96%    Intake/Output Summary (Last 24 hours) at 02/14/2021 1334 Last data filed at 02/13/2021 2348 Gross per 24 hour  Intake 0 ml  Output 0 ml  Net 0 ml   Filed Weights   02/04/21 1159  Weight: 50 kg   Weight change:  Body mass index is 19.53 kg/m.   Physical  Exam: General exam: Pleasant, elderly female.  Not in distress. Skin: No rashes, lesions or ulcers. HEENT: Atraumatic, normocephalic, no obvious bleeding Lungs: Slow respiratory effort.  Clear to auscultation bilaterally CVS: Regular rate and rhythm, no murmur. GI/Abd soft, nontender, nondistended, bowel sound present CNS: Alert, awake, slow to respond, demented, disoriented, remains at baseline Psychiatry: Depressed look Extremities: No pedal edema, no calf tenderness  Data Review: I have personally reviewed the laboratory data and studies available.  No results for input(s): WBC, NEUTROABS, HGB, HCT, MCV, PLT in the last 168 hours.  Recent Labs  Lab 02/09/21 0641 02/10/21 1113 02/11/21 0416 02/13/21 0523  NA 137 138 138 139  K 3.1* 3.3* 4.5 3.8  CL 101 103 105 102  CO2 '29 30 29 30  '$ GLUCOSE 96 130* 94 102*  BUN 24* 24* 21 31*  CREATININE 0.35* 0.46 0.40* 0.53  CALCIUM 8.7* 8.7* 8.5* 8.7*  MG 2.1  --   --   --     F/u labs ordered Unresulted Labs (From admission, onward)    None       Signed, Terrilee Croak, MD [facility] 02/14/2021

## 2021-02-14 NOTE — Progress Notes (Signed)
Physical Therapy Treatment Patient Details Name: Karen Krueger MRN: [id number] DOB: 1932-04-10 Today's Date: 02/14/2021    History of Present Illness Pt is a 87 y.o. female with medical history significant of hypertension, hyperlipidemia, stroke, TIA, left eye blindness, dementia, glaucoma, UTI, who presents with fever, cough, shortness of breath and weakness.    PT Comments    Pt in bed.  Assisted to supine with max a x 1.  Initially unsteady needing mod verbal and tactile cues to remain upright but with time is able to progress to close supervision.  1st attempt at transfer to chair pt begins softly crying.  Transfer stopped and when asked, stated she did not feel well.  Coughing occasionally during session.  She is left to rest and she initiates conversation about her sister and after a few moments of talking she calms and is able to complete transfer with max a /dependant stand pivot to recliner.  Alarm on and needs met.     Follow Up Recommendations  SNF     Equipment Recommendations  None recommended by PT    Recommendations for Other Services       Precautions / Restrictions Precautions Precautions: Fall Restrictions Weight Bearing Restrictions: No    Mobility  Bed Mobility Overal bed mobility: Needs Assistance Bed Mobility: Supine to Sit     Supine to sit: Max assist;HOB elevated          Transfers Overall transfer level: Needs assistance Equipment used: None Transfers: Sit to/from Stand Sit to Stand: Max assist;Total assist Stand pivot transfers: Max assist;Total assist          Ambulation/Gait             General Gait Details: unable   Stairs             Wheelchair Mobility    Modified Rankin (Stroke Patients Only)       Balance Overall balance assessment: Needs assistance Sitting-balance support: Feet supported;No upper extremity supported Sitting balance-Leahy Scale: Poor Sitting balance - Comments: initially poor sitting  balance with tactile cues to remain upright and not fall back but she does progress to close supervision with time   Standing balance support: No upper extremity supported;During functional activity Standing balance-Leahy Scale: Zero Standing balance comment: MAX A to maintain standing balance                            Cognition Arousal/Alertness: Awake/alert Behavior During Therapy: WFL for tasks assessed/performed Overall Cognitive Status: History of cognitive impairments - at baseline                                        Exercises      General Comments        Pertinent Vitals/Pain Pain Assessment: No/denies pain    Home Living                      Prior Function            PT Goals (current goals can now be found in the care plan section) Progress towards PT goals: Progressing toward goals    Frequency    Min 2X/week      PT Plan Discharge plan needs to be updated    Co-evaluation  AM-PAC PT "6 Clicks" Mobility   Outcome Measure  Help needed turning from your back to your side while in a flat bed without using bedrails?: A Lot Help needed moving from lying on your back to sitting on the side of a flat bed without using bedrails?: Total Help needed moving to and from a bed to a chair (including a wheelchair)?: Total Help needed standing up from a chair using your arms (e.g., wheelchair or bedside chair)?: Total Help needed to walk in hospital room?: Total Help needed climbing 3-5 steps with a railing? : Total 6 Click Score: 7    End of Session Equipment Utilized During Treatment: Gait belt Activity Tolerance: Patient limited by lethargy Patient left: in bed;with call bell/phone within reach;with bed alarm set Nurse Communication: Mobility status PT Visit Diagnosis: Other abnormalities of gait and mobility (R26.89);Muscle weakness (generalized) (M62.81);Difficulty in walking, not elsewhere  classified (R26.2)     Time: 1809-7044 PT Time Calculation (min) (ACUTE ONLY): 17 min  Charges:  $Therapeutic Activity: 8-22 mins                    Chesley Noon, PTA 02/14/21, 11:55 AM 02/14/2021, 11:52 AM

## 2021-02-14 NOTE — Care Management Important Message (Signed)
Important Message  Patient Details  Name: MALONIE DEGREE MRN: [id number] Date of Birth: 1931/11/09   Medicare Important Message Given:  Yes - Important Message mailed due to current [facility]  Daughter returned call.  Reviewed Medicare IM.  Copy mailed to address on file.    Dannette Barbara 02/14/2021, 11:26 AM

## 2021-02-15 DIAGNOSIS — J1282 Pneumonia due to coronavirus disease 2019: Secondary | ICD-10-CM | POA: Diagnosis not present

## 2021-02-15 DIAGNOSIS — U071 COVID-19: Secondary | ICD-10-CM | POA: Diagnosis not present

## 2021-02-15 MED ORDER — DM-GUAIFENESIN ER 30-600 MG PO TB12: 1.0000 | ORAL_TABLET | Freq: Two times a day (BID) | ORAL | Status: AC | PRN

## 2021-02-15 MED ORDER — ALBUTEROL SULFATE HFA 108 (90 BASE) MCG/ACT IN AERS: 2.0000 | INHALATION_SPRAY | RESPIRATORY_TRACT | Status: AC | PRN

## 2021-02-15 MED ORDER — ENSURE ENLIVE PO LIQD: 237.0000 mL | Freq: Two times a day (BID) | ORAL | 12 refills | Status: AC

## 2021-02-15 MED ORDER — VERAPAMIL HCL ER 240 MG PO TBCR: 240.0000 mg | EXTENDED_RELEASE_TABLET | Freq: Every day | ORAL | Status: AC

## 2021-02-15 NOTE — Progress Notes (Signed)
Mobility Specialist - Progress Note   02/15/21 1200  Mobility  Activity Dangled on edge of bed;Turned to right side;Turned to left side  Range of Motion/Exercises Left arm;Right arm (UB dressing)  Level of Assistance Maximum assist, patient does 25-49%  Assistive Device None  Distance Ambulated (ft) 0 ft  Mobility Sit up in bed/chair position for meals  Mobility Response Tolerated well  Mobility performed by Mobility specialist  $Mobility charge 1 Mobility    Pre-mobility: 87 HR, 87% SpO2 Post-mobility: 92 HR, 92% SpO2   Pt lying in bed upon arrival, nasal cannula set on 2L but resting on pt's forehead. O2 87% at arrival, Angie reapplied. Pt moaning and making grimacing faces, responds 'yes' when asked if she's in pain but unable to clarify further. Pt sat EOB with max assist, poor sitting balance. Mobility tech assisted with new gown change and briefs. Pt returned supine and was able to roll L and R with maxA. Still with persistent cough. Mobility provided pt with lunch tray at the end of session, but pt politely declined it at this time. Pt left in bed with alarm set.    Kathee Delton Mobility Specialist 02/15/21, 12:26 PM

## 2021-02-15 NOTE — TOC Transition Note (Signed)
Transition of Care [facility]) - CM/SW Discharge Note   Patient Details  Name: NAZYAH TODHUNTER MRN: [id number] Date of Birth: 1932-04-27  Transition of Care [facility]) CM/SW Contact:  Beverly Sessions, RN Phone Number: 02/15/2021, 12:03 PM   Clinical Narrative:    Patient to discharge today to [facility] VM left for daughter to update.  Spoke to son in Sports coach by phone to notify  DC info sent in the hub EMS packet and DNR on chart Bedside RN to call report EMS transport called    Final next level of care: Skilled Nursing Facility Barriers to Discharge: Barriers Resolved   Patient Goals and CMS Choice Patient states their goals for this hospitalization and ongoing recovery are:: Patient not fully oriented.      Discharge Placement                       Discharge Plan and Services     Post Acute Care Choice: Skilled Nursing Facility                               Social Determinants of Health (SDOH) Interventions     Readmission Risk Interventions No flowsheet data found.

## 2021-02-15 NOTE — Discharge Summary (Signed)
Physician Discharge Summary  Karen Krueger [id number] DOB: Nov 22, 1931 DOA: 02/04/2021  PCP: Idelle Crouch, MD  Admit date: 02/04/2021 Discharge date: 02/15/2021  Admitted From: Home Discharge disposition: SNF   Code Status: DNR   Discharge Diagnosis:   Principal Problem:   Pneumonia due to COVID-19 virus Active Problems:   HTN (hypertension)   History of CVA (cerebrovascular accident)   Acute respiratory failure with hypoxia ([place])   Sepsis ([place])   Hypokalemia     Chief Complaint  Patient presents with   Fever    Brief narrative: Karen Krueger is a 87 y.o. female with PMH significant for HTN, HLD, stroke, left eye blindness, dementia, glaucoma, UTI. Patient presented to the ED on 7/17 with complaint of fever, cough, shortness of breath, weakness. She lives with her daughter and son-in-law both of whom have COVID-19 infection. The day before admission, patient tested COVID positive at home.  In the ED, patient was noted to have a fever of 101.6, respiratory rate 22, hypoxic to 89% on room air and was started on 4 L oxygen by nasal cannula. Labs showed WBC count 4.4, lactic acid 0.9. Chest x-ray showed mild opacity in the lingula. Admitted to hospitalist service.  Subjective: Patient was seen and examined this morning.   Lying down in bed.  Not in distress.  Pleasantly demented.  Not restless or agitated.  Hospital course: COVID pneumonia Acute respiratory failure with hypoxia -Presented with shortness of breath, cough, fever -COVID test: COVID-positive on test at home. -Chest imaging: Mild opacity in the lingula -Treatment: Currently on a 5-day course of IV remdesivir and steroids.   -Continue 2 L oxygen by nasal cannula as needed  HTN (hypertension) -Blood pressure improved with increasing verapamil dose to 240 mg daily.   History of CVA (cerebrovascular accident) -Plavix, statin to continue  Advanced dementia -Daughter is unable to take care at home.   SNF placement arranged.   Allergies as of 02/15/2021       Reactions   Codeine Nausea And Vomiting   Sulfa Antibiotics Other (See Comments)   Unknown   Penicillins Nausea Only        Medication List     STOP taking these medications    aspirin 81 MG EC tablet       TAKE these medications    acetaminophen 500 MG tablet Commonly known as: TYLENOL Take 500 mg by mouth every 6 (six) hours as needed for fever.   albuterol 108 (90 Base) MCG/ACT inhaler Commonly known as: VENTOLIN HFA Inhale 2 puffs into the lungs every 4 (four) hours as needed for wheezing or shortness of breath.   atorvastatin 40 MG tablet Commonly known as: LIPITOR Take 1 tablet (40 mg total) by mouth daily.   clopidogrel 75 MG tablet Commonly known as: PLAVIX Take 75 mg by mouth daily.   Coricidin HBP Night Cld/Flu MS 10-6.25-325 MG/15ML Liqd Generic drug: DM-Doxylamine-Acetaminophen Take 15 mLs by mouth every 4 (four) hours as needed (cough).   dextromethorphan-guaiFENesin 30-600 MG 12hr tablet Commonly known as: MUCINEX DM Take 1 tablet by mouth 2 (two) times daily as needed for cough.   feeding supplement Liqd Take 237 mLs by mouth 2 (two) times daily between meals.   latanoprost 0.005 % ophthalmic solution Commonly known as: XALATAN Place 1 drop into the left eye at bedtime.   timolol 0.5 % ophthalmic solution Commonly known as: TIMOPTIC Place 1 drop into the right eye 2 (two) times daily.  verapamil 240 MG CR tablet Commonly known as: CALAN-SR Take 1 tablet (240 mg total) by mouth daily. Start taking on: February 16, 2021 What changed:  medication strength how much to take        Discharge Instructions:  Diet Recommendation: Regular diet as tolerated   Follow with Primary MD Idelle Crouch, MD in 7 days   Get CBC/BMP checked in next visit within 1 week by PCP or SNF MD ( we routinely change or add medications that can affect your baseline labs and fluid status, therefore  we recommend that you get the mentioned basic workup next visit with your PCP, your PCP may decide not to get them or add new tests based on their clinical decision)  On your next visit with your PCP, please Get Medicines reviewed and adjusted.  Please request your PCP  to go over all Hospital Tests and Procedure/Radiological results at the follow up, please get all Hospital records sent to your Prim MD by signing hospital release before you go home.  Activity: As tolerated with Full fall precautions use walker/cane & assistance as needed  For Heart failure patients - Check your Weight same time everyday, if you gain over 2 pounds, or you develop in leg swelling, experience more shortness of breath or chest pain, call your Primary MD immediately. Follow Cardiac Low Salt Diet and 1.5 lit/day fluid restriction.  If you have smoked or chewed Tobacco in the last 2 yrs please stop smoking, stop any regular Alcohol  and or any Recreational drug use.  If you experience worsening of your admission symptoms, develop shortness of breath, life threatening emergency, suicidal or homicidal thoughts you must seek medical attention immediately by calling 911 or calling your MD immediately  if symptoms less severe.  You Must read complete instructions/literature along with all the possible adverse reactions/side effects for all the Medicines you take and that have been prescribed to you. Take any new Medicines after you have completely understood and accpet all the possible adverse reactions/side effects.   Do not drive, operate heavy machinery, perform activities at heights, swimming or participation in water activities or provide baby sitting services if your were admitted for syncope or siezures until you have seen by Primary MD or a Neurologist and advised to do so again.  Do not drive when taking Pain medications.  Do not take more than prescribed Pain, Sleep and Anxiety Medications  Wear Seat belts while  driving.   Please note You were cared for by a hospitalist during your hospital stay. If you have any questions about your discharge medications or the care you received while you were in the hospital after you are discharged, you can call the unit and asked to speak with the hospitalist on call if the hospitalist that took care of you is not available. Once you are discharged, your primary care physician will handle any further medical issues. Please note that NO REFILLS for any discharge medications will be authorized once you are discharged, as it is imperative that you return to your primary care physician (or establish a relationship with a primary care physician if you do not have one) for your aftercare needs so that they can reassess your need for medications and monitor your lab values.    Follow ups:    Follow-up Information     Sparks, Leonie Douglas, MD Follow up.   Specialty: Internal Medicine Contact information: [place] [address] [phone]  Kate Sable, MD .   Specialties: Cardiology, Radiology Contact information: [place] [address] [phone]                 Wound care:     Discharge Exam:   Vitals:   02/14/21 1623 02/14/21 2234 02/15/21 0506 02/15/21 0831  BP: 106/60 136/80 121/73 130/75  Pulse: 78 83 79 96  Resp: '18 14 18   '$ Temp: 98.2 F (36.8 C) 98.4 F (36.9 C) 98.5 F (36.9 C) 98.5 F (36.9 C)  TempSrc: Oral  Oral   SpO2: 94% 91% 94% 91%  Weight:      Height:        Body mass index is 19.53 kg/m.  General exam: Pleasant elderly Caucasian female.  Not in physical distress Skin: No rashes, lesions or ulcers. HEENT: Atraumatic, normocephalic, no obvious bleeding Lungs: Clear to auscultation bilaterally CVS: Regular rate and rhythm, no murmur GI/Abd soft, nontender, nondistended, bowel sound present CNS: Demented at baseline, sleeping, opens eyes on verbal command.   Oriented to place Psychiatry: Cheerful Extremities: No pedal edema, no calf tenderness  Time coordinating discharge: 35 minutes   The results of significant diagnostics from this hospitalization (including imaging, microbiology, ancillary and laboratory) are listed below for reference.    Procedures and Diagnostic Studies:   DG Chest 2 View  Result Date: 02/04/2021 CLINICAL DATA:  Fever. EXAM: CHEST - 2 VIEW COMPARISON:  January 05, 2017 FINDINGS: Stable cardiomegaly. The hila and mediastinum are unremarkable. No pneumothorax. The right lung is clear. Mild opacity in the lingula on the frontal view. No other abnormalities are identified. IMPRESSION: Mild opacity in the lingula may represent atelectasis. Early infiltrate considered less likely. Recommend clinical correlation. Electronically Signed   By: Dorise Bullion III M.D   On: 02/04/2021 13:17     Labs:   Basic Metabolic Panel: Recent Labs  Lab 02/09/21 [id number] 02/10/21 1113 02/11/21 0416 02/13/21 0523  NA 137 138 138 139  K 3.1* 3.3* 4.5 3.8  CL 101 103 105 102  CO2 '29 30 29 30  '$ GLUCOSE 96 130* 94 102*  BUN 24* 24* 21 31*  CREATININE 0.35* 0.46 0.40* 0.53  CALCIUM 8.7* 8.7* 8.5* 8.7*  MG 2.1  --   --   --    GFR Estimated Creatinine Clearance: 39.1 mL/min (by C-G formula based on SCr of 0.53 mg/dL). Liver Function Tests: No results for input(s): AST, ALT, ALKPHOS, BILITOT, PROT, ALBUMIN in the last 168 hours. No results for input(s): LIPASE, AMYLASE in the last 168 hours. No results for input(s): AMMONIA in the last 168 hours. Coagulation profile No results for input(s): INR, PROTIME in the last 168 hours.  CBC: No results for input(s): WBC, NEUTROABS, HGB, HCT, MCV, PLT in the last 168 hours. Cardiac Enzymes: No results for input(s): CKTOTAL, CKMB, CKMBINDEX, TROPONINI in the last 168 hours. BNP: Invalid input(s): POCBNP CBG: No results for input(s): GLUCAP in the last 168 hours. D-Dimer Recent Labs     02/13/21 1311  DDIMER 0.75*   Hgb A1c No results for input(s): HGBA1C in the last 72 hours. Lipid Profile No results for input(s): CHOL, HDL, LDLCALC, TRIG, CHOLHDL, LDLDIRECT in the last 72 hours. Thyroid function studies No results for input(s): TSH, T4TOTAL, T3FREE, THYROIDAB in the last 72 hours.  Invalid input(s): FREET3 Anemia work up No results for input(s): VITAMINB12, FOLATE, FERRITIN, TIBC, IRON, RETICCTPCT in the last 72 hours. Microbiology Recent Results (from the past 240 hour(s))  Resp Panel by RT-PCR (Flu A&B, Covid) Nasopharyngeal Swab     Status: Abnormal   Collection Time: 02/09/21  4:45 PM   Specimen: Nasopharyngeal Swab; Nasopharyngeal(NP) swabs in vial transport medium  Result Value Ref Range Status   SARS Coronavirus 2 by RT PCR POSITIVE (A) NEGATIVE Final    Comment: RESULT CALLED TO, READ BACK BY AND VERIFIED WITH: MARCELLA TURNER '@[id number]'$  02/10/21 LFD (NOTE) SARS-CoV-2 target nucleic acids are DETECTED.  The SARS-CoV-2 RNA is generally detectable in upper respiratory specimens during the acute phase of infection. Positive results are indicative of the presence of the identified virus, but do not rule out bacterial infection or co-infection with other pathogens not detected by the test. Clinical correlation with patient history and other diagnostic information is necessary to determine patient infection status. The expected result is Negative.  Fact Sheet for Patients: EntrepreneurPulse.com.au  Fact Sheet for Healthcare Providers: IncredibleEmployment.be  This test is not yet approved or cleared by the [place] FDA and  has been authorized for detection and/or diagnosis of SARS-CoV-2 by FDA under an Emergency Use Authorization (EUA).  This EUA will remain in effect (meaning this test can b e used) for the duration of  the COVID-19 declaration under Section 564(b)(1) of the Act, 21 U.S.C. section 360bbb-3(b)(1),  unless the authorization is terminated or revoked sooner.     Influenza A by PCR NEGATIVE NEGATIVE Final   Influenza B by PCR NEGATIVE NEGATIVE Final    Comment: (NOTE) The Xpert Xpress SARS-CoV-2/FLU/RSV plus assay is intended as an aid in the diagnosis of influenza from Nasopharyngeal swab specimens and should not be used as a sole basis for treatment. Nasal washings and aspirates are unacceptable for Xpert Xpress SARS-CoV-2/FLU/RSV testing.  Fact Sheet for Patients: EntrepreneurPulse.com.au  Fact Sheet for Healthcare Providers: IncredibleEmployment.be  This test is not yet approved or cleared by the [place] FDA and has been authorized for detection and/or diagnosis of SARS-CoV-2 by FDA under an Emergency Use Authorization (EUA). This EUA will remain in effect (meaning this test can be used) for the duration of the COVID-19 declaration under Section 564(b)(1) of the Act, 21 U.S.C. section 360bbb-3(b)(1), unless the authorization is terminated or revoked.  Performed at [facility], [address], [place], [place] [address]      Signed: Terrilee Croak  [facility] 02/15/2021, 10:48 AM

## 2021-02-15 NOTE — Progress Notes (Signed)
Karen Krueger to be D/C'd Skilled nursing facility per MD order.  Discussed prescriptions and follow up appointments with the patient. Prescriptions given to patient, medication list explained in detail. Pt verbalized understanding.  Allergies as of 02/15/2021       Reactions   Codeine Nausea And Vomiting   Sulfa Antibiotics Other (See Comments)   Unknown   Penicillins Nausea Only        Medication List     STOP taking these medications    aspirin 81 MG EC tablet       TAKE these medications    acetaminophen 500 MG tablet Commonly known as: TYLENOL Take 500 mg by mouth every 6 (six) hours as needed for fever.   albuterol 108 (90 Base) MCG/ACT inhaler Commonly known as: VENTOLIN HFA Inhale 2 puffs into the lungs every 4 (four) hours as needed for wheezing or shortness of breath.   atorvastatin 40 MG tablet Commonly known as: LIPITOR Take 1 tablet (40 mg total) by mouth daily.   clopidogrel 75 MG tablet Commonly known as: PLAVIX Take 75 mg by mouth daily.   Coricidin HBP Night Cld/Flu MS 10-6.25-325 MG/15ML Liqd Generic drug: DM-Doxylamine-Acetaminophen Take 15 mLs by mouth every 4 (four) hours as needed (cough).   dextromethorphan-guaiFENesin 30-600 MG 12hr tablet Commonly known as: MUCINEX DM Take 1 tablet by mouth 2 (two) times daily as needed for cough.   feeding supplement Liqd Take 237 mLs by mouth 2 (two) times daily between meals.   latanoprost 0.005 % ophthalmic solution Commonly known as: XALATAN Place 1 drop into the left eye at bedtime.   timolol 0.5 % ophthalmic solution Commonly known as: TIMOPTIC Place 1 drop into the right eye 2 (two) times daily.   verapamil 240 MG CR tablet Commonly known as: CALAN-SR Take 1 tablet (240 mg total) by mouth daily. Start taking on: February 16, 2021 What changed:  medication strength how much to take        Vitals:   02/15/21 0506 02/15/21 0831  BP: 121/73 130/75  Pulse: 79 96  Resp: 18   Temp: 98.5  F (36.9 C) 98.5 F (36.9 C)  SpO2: 94% 91%    Skin clean, dry and intact without evidence of skin break down, no evidence of skin tears noted. IV catheter discontinued intact. Site without signs and symptoms of complications. Dressing and pressure applied. Pt denies pain at this time. No complaints noted.  An After Visit Summary was printed and given to the patient. Patient escorted via [place], and D/C home via private auto.  [place] C. Deatra Ina

## 2021-02-22 ENCOUNTER — Other Ambulatory Visit: Payer: Self-pay

## 2021-02-22 ENCOUNTER — Non-Acute Institutional Stay: Payer: Medicare Other | Admitting: Primary Care

## 2021-02-22 DIAGNOSIS — G309 Alzheimer's disease, unspecified: Secondary | ICD-10-CM

## 2021-02-22 DIAGNOSIS — F0280 Dementia in other diseases classified elsewhere without behavioral disturbance: Secondary | ICD-10-CM

## 2021-02-22 DIAGNOSIS — Z515 Encounter for palliative care: Secondary | ICD-10-CM

## 2021-02-22 DIAGNOSIS — J1282 Pneumonia due to coronavirus disease 2019: Secondary | ICD-10-CM

## 2021-02-22 NOTE — Progress Notes (Signed)
[facility] Pall[facility] Consult Note Telephone: [phone]  Fax: [phone]   Date of encounter: 02/22/21 PATIENT NAME: Karen Krueger [place] [address]   [phone] (home)  DOB: 1932/01/21 MRN: [id number] PRIMARY CARE PROVIDER:    Idelle Crouch, MD,  [address] [place]  [address] [phone]  REFERRING PROVIDER:   Cephas Darby, FNP Karen Krueger, [address] [place],  [place] [address] [phone]   RESPONSIBLE PARTY:    Contact Information     Name Relation Home Work Karen Krueger Daughter [phone]  [phone]      I met face to face with [facility] in [facility]. Palliative Care was asked to follow this patient by consultation request of  Karen Darby, FNP  to address advance care planning and complex medical decision making. This is the initial visit.                                     ASSESSMENT AND PLAN / RECOMMENDATIONS:   Advance Care Planning/Goals of Care: Goals include to maximize quality of life and symptom management.  CODE STATUS:  DNR per SNF record  Symptom Management/Plan:  Dyspnea: Patient is resting with oxygen on, appears to not be laboring to breathe.  Mentation: Confused, but interactive and pleasant. I assisted her with some water as she was not able to reach due to lowered bed for fall risk. Recommend bed rail cup holder.   Pain: Denies pain or other discomfort. Meds reviewed.  Follow up Palliative Care Visit: Palliative care will continue to follow for complex medical decision making, advance care planning, and clarification of goals. Return 2-4 weeks or prn.  I spent 25 minutes providing this consultation. More than 50% of the time in this consultation was spent in counseling and care coordination.   PPS: 30%  HOSPICE ELIGIBILITY/DIAGNOSIS: TBD  Chief Complaint: debility, decline  HISTORY OF PRESENT  ILLNESS:  Karen Krueger is a 87 y.o. year old female  with Alzheimer's dementia, recent covid infection and covid pneumonia, debility. At SNF following hospitalization for covid infection. Staff endorses decline; dependent in all adls and iadls. Fall risk .   History obtained from review of EMR, discussion with primary team, and interview with family, facility staff/caregiver and/or Ms. Karen Krueger.  I reviewed available labs, medications, imaging, studies and related documents from the EMR.  Records reviewed and summarized above.   ROS/staff   General: NAD ENMT:  endorses occ dysphagia Cardiovascular:  endorses mild DOE Pulmonary: denies cough, denies increased SOB Abdomen: endorses good to fair  appetite, denies constipation, endorses incontinence of bowel GU: denies dysuria, endorses incontinence of urine MSK:  endorses weakness,  fall risk protocol Skin: denies rashes or wounds Neurological: denies current  pain, denies insomnia Psych: Endorses positive mood Heme/lymph/immuno: denies bruises, abnormal bleeding  Physical Exam: Current and past weights: 102 lbs Constitutional: NAD General: frail appearing, thin EYES: anicteric sclera, lids intact, no discharge  ENMT: intact hearing, oral mucous membranes dry, dentition intact CV:  RRR, no LE edema Pulmonary: no increased work of breathing, no cough, oxygen 2 L  [place] Abdomen: intake 50-75%,  no ascites GU: deferred MSK: ++ sarcopenia, moves all extremities, non ambulatory Skin: warm and dry, no rashes or wounds on visible skin Neuro:  ++ generalized weakness,  ++ cognitive impairment Psych: non-anxious affect,  A and O x 1 Hem/lymph/immuno: no widespread bruising CURRENT PROBLEM LIST:  Patient Active Problem List   Diagnosis Date Noted   Pneumonia due to COVID-19 virus 02/04/2021   Acute respiratory failure with hypoxia ([facility]) 02/04/2021   Sepsis ([facility]) 02/04/2021   Hypokalemia 02/04/2021   Fever    TIA (transient ischemic attack)  01/17/2020   Blindness 01/17/2020   History of CVA (cerebrovascular accident) 01/17/2020   Hyponatremia 08/04/2017   UTI (urinary tract infection) 08/04/2017   Shingles outbreak 08/04/2017   Altered mental status 01/05/2017   Dementia due to Alzheimer's disease ([facility]) 08/03/2016   Fall 08/02/2016   Acute encephalopathy 08/02/2016   HTN (hypertension) 08/02/2016   Glaucoma 08/02/2016   Elevated troponin 08/02/2016   PAST MEDICAL HISTORY:  Active Ambulatory Problems    Diagnosis Date Noted   Fall 08/02/2016   Acute encephalopathy 08/02/2016   HTN (hypertension) 08/02/2016   Glaucoma 08/02/2016   Elevated troponin 08/02/2016   Dementia due to Alzheimer's disease ([facility]) 08/03/2016   Altered mental status 01/05/2017   Hyponatremia 08/04/2017   UTI (urinary tract infection) 08/04/2017   Shingles outbreak 08/04/2017   TIA (transient ischemic attack) 01/17/2020   Blindness 01/17/2020   History of CVA (cerebrovascular accident) 01/17/2020   Pneumonia due to COVID-19 virus 02/04/2021   Acute respiratory failure with hypoxia ([facility]) 02/04/2021   Sepsis ([facility]) 02/04/2021   Hypokalemia 02/04/2021   Fever    Resolved Ambulatory Problems    Diagnosis Date Noted   No Resolved Ambulatory Problems   Past Medical History:  Diagnosis Date   Blind left eye    Cancer ([facility])    Dementia ([facility])    Hypertension    Stroke ([facility])    SOCIAL HX:  Social History   Tobacco Use   Smoking status: Former   Smokeless tobacco: Never   Tobacco comments:    quit over 45 yrs ago  Substance Use Topics   Alcohol use: No   FAMILY HX:  Family History  Problem Relation Age of Onset   Heart disease Father    Stroke Daughter       ALLERGIES:  Allergies  Allergen Reactions   Codeine Nausea And Vomiting   Sulfa Antibiotics Other (See Comments)    Unknown    Penicillins Nausea Only     PERTINENT MEDICATIONS:  Outpatient Encounter Medications as of 02/22/2021  Medication Sig   acetaminophen  (TYLENOL) 500 MG tablet Take 500 mg by mouth every 6 (six) hours as needed for fever.   albuterol (VENTOLIN HFA) 108 (90 Base) MCG/ACT inhaler Inhale 2 puffs into the lungs every 4 (four) hours as needed for wheezing or shortness of breath.   atorvastatin (LIPITOR) 40 MG tablet Take 1 tablet (40 mg total) by mouth daily.   clopidogrel (PLAVIX) 75 MG tablet Take 75 mg by mouth daily.   dextromethorphan-guaiFENesin (MUCINEX DM) 30-600 MG 12hr tablet Take 1 tablet by mouth 2 (two) times daily as needed for cough.   DM-Doxylamine-Acetaminophen (CORICIDIN HBP NIGHT CLD/FLU MS) 10-6.25-325 MG/15ML LIQD Take 15 mLs by mouth every 4 (four) hours as needed (cough).   feeding supplement (ENSURE ENLIVE / ENSURE PLUS) LIQD Take 237 mLs by mouth 2 (two) times daily between meals.   latanoprost (XALATAN) 0.005 % ophthalmic solution Place 1 drop into the left eye at bedtime.    timolol (TIMOPTIC) 0.5 % ophthalmic solution Place 1 drop into the right eye 2 (two) times daily.   verapamil (CALAN-SR) 240 MG CR tablet Take  1 tablet (240 mg total) by mouth daily.   No facility-administered encounter medications on file as of 02/22/2021.   Thank you for the opportunity to participate in the care of Ms. Fishman.  The palliative care team will continue to follow. Please call our office at [phone] if we can be of additional assistance.    [place] , NP ,   COVID-19 PATIENT SCREENING TOOL Patient on covid + precautions.  

## 2021-02-23 ENCOUNTER — Telehealth: Payer: Self-pay | Admitting: Primary Care

## 2021-02-23 NOTE — Telephone Encounter (Signed)
T/c to POA Daughter [name]. Message left for return call.

## 2021-03-13 ENCOUNTER — Non-Acute Institutional Stay: Payer: Medicare Other | Admitting: Primary Care

## 2021-03-13 ENCOUNTER — Other Ambulatory Visit: Payer: Self-pay

## 2021-03-13 DIAGNOSIS — F0280 Dementia in other diseases classified elsewhere without behavioral disturbance: Secondary | ICD-10-CM

## 2021-03-13 DIAGNOSIS — J1282 Pneumonia due to coronavirus disease 2019: Secondary | ICD-10-CM

## 2021-03-13 DIAGNOSIS — Z8673 Personal history of transient ischemic attack (TIA), and cerebral infarction without residual deficits: Secondary | ICD-10-CM

## 2021-03-13 DIAGNOSIS — U071 COVID-19: Secondary | ICD-10-CM

## 2021-03-13 NOTE — Progress Notes (Signed)
[facility] Palliative Care Consult Note Telephone: [phone]  Fax: [phone]    Date of encounter: 03/13/21 2 pm  PATIENT NAME: Karen Krueger [address] [place] [address]   [phone] (home)  DOB: 12-07-1931 MRN: [id number] PRIMARY CARE PROVIDER:    Marguarite Arbour, MD,  [address] [place] [address] [phone]  REFERRING PROVIDER:   Wardell Honour, FNP [address] [place],  [place] [address] [phone]   RESPONSIBLE PARTY:    Contact Information     Name Relation Home Work [place] Daughter [phone]  [phone]       I met face to face with [facility] in [facility]. Palliative Care was asked to follow this patient by consultation request of  Wardell Honour, FNP  to address advance care planning and complex medical decision making. This is a follow up visit.                                   ASSESSMENT AND PLAN / RECOMMENDATIONS:   Advance Care Planning/Goals of Care: Goals include to maximize quality of life and symptom management. ACP not on file, have not been to speak with daughter.  Symptom Management/Plan:  I met with patient in her nursing home room. She was sitting up in bed with oxygen at 2 L. She is awake and interactive, marginally. She stated she was not in any pain or discomfort. She was not able to tell me anything about her family. Staff states that she is totally blind and must be fed by staff. Patient does appear very frail.  Staff concur she is declining however her weight is relatively stable but her ability for ADLs is total dependent. I Reached out to daughter Karen Krueger but no answer, message left.   Follow up Palliative Care Visit: Palliative care will continue to follow for complex medical decision making, advance care planning, and clarification of goals. Return 6 weeks or prn.  I spent 25   minutes providing this consultation. More  than 50% of the time in this consultation was spent in counseling and care coordination.  PPS: 30%  HOSPICE ELIGIBILITY/DIAGNOSIS: TBD  Chief Complaint: debility  HISTORY OF PRESENT ILLNESS:  KEYRI SALBERG is a 87 y.o. year old female  with dementia, debility .   History obtained from review of EMR, discussion with primary team, and interview with family, facility staff/caregiver and/or Karen Krueger.  I reviewed available labs, medications, imaging, studies and related documents from the EMR.  Records reviewed and summarized above.   ROS Eyvonne Mechanic  General: NAD ENMT: dysphagia Pulmonary: denies cough, denies increased SOB Abdomen: endorses good appetite, denies constipation, endorses incontinence of bowel GU: denies dysuria, endorses incontinence of urine MSK:  endorses weakness,  no falls reported Skin: denies rashes or wounds Neurological: denies pain, denies insomnia Psych: Endorses positive mood Heme/lymph/immuno: denies bruises, abnormal bleeding  Physical Exam: Current and past weights: 103 lbs Constitutional: NAD General: frail appearing, thin EYES: anicteric sclera, lids intact, no discharge  ENMT: intact hearing, oral mucous membranes moist CV: S1S2, RRR, no LE edema Pulmonary: LCTA, no increased work of breathing, no cough, room air Abdomen: intake 75%, normo-active BS + 4 quadrants, soft and non tender, no ascites GU: deferred MSK:++ sarcopenia, moves all extremities, non ambulatory Skin: warm and dry, no rashes or wounds on visible skin Neuro:  ++  generalized weakness,  ++ cognitive impairment Psych: non-anxious affect, A and O x 1 Hem/lymph/immuno: no widespread bruising   Thank you for the opportunity to participate in the care of Karen Krueger.  The palliative care team will continue to follow. Please call our office at [phone] if we can be of additional assistance.   Jason Coop, NP   COVID-19 PATIENT SCREENING TOOL Asked and negative response  unless otherwise noted:   Have you had symptoms of covid, tested positive or been in contact with someone with symptoms/positive test in the past 5-10 days?

## 2021-03-21 ENCOUNTER — Non-Acute Institutional Stay: Payer: Medicare Other | Admitting: Primary Care

## 2021-03-21 ENCOUNTER — Other Ambulatory Visit: Payer: Self-pay

## 2021-03-21 DIAGNOSIS — Z8673 Personal history of transient ischemic attack (TIA), and cerebral infarction without residual deficits: Secondary | ICD-10-CM

## 2021-03-21 DIAGNOSIS — G309 Alzheimer's disease, unspecified: Secondary | ICD-10-CM

## 2021-03-21 DIAGNOSIS — F0280 Dementia in other diseases classified elsewhere without behavioral disturbance: Secondary | ICD-10-CM

## 2021-03-21 DIAGNOSIS — Z515 Encounter for palliative care: Secondary | ICD-10-CM

## 2021-03-21 NOTE — Progress Notes (Signed)
[facility] Palliative Care Consult Note Telephone: [phone]  Fax: [phone]    Date of encounter: 03/21/21 12:00 PM PATIENT NAME: Karen Krueger [place] [address]   [phone] (home)  DOB: [birth year] MRN: [id number] PRIMARY CARE PROVIDER:    Idelle Crouch, MD,  [address] [place] [address] [phone]  REFERRING PROVIDER:   Idelle Crouch, MD [facility] [place],  [place] [address] [phone]  RESPONSIBLE PARTY:    Contact Information     Name Relation Home Work [place] Daughter [phone]  [phone]        I met face to face with patient and family in [facility]. Palliative Care was asked to follow this patient by consultation request of  Sparks, Leonie Douglas, MD to address advance care planning and complex medical decision making. This is a follow up visit.                                   ASSESSMENT AND PLAN / RECOMMENDATIONS:   Advance Care Planning/Goals of Care: Goals include to maximize quality of life and symptom management. Our advance care planning conversation included a discussion about:    The value and importance of advance care planning  Experiences with loved ones who have been seriously ill or have died  Exploration of personal, cultural or spiritual beliefs that might influence medical decisions  Exploration of goals of care in the event of a sudden injury or illness  Identification and preparation of a healthcare agent  Review and updating or creation of an  advance directive document  Decision not to resuscitate or to de-escalate disease focused treatments due to poor prognosis. CODE STATUS: not on file Long discussion with daughter who is having crisis of caregiving for mother. She endorse many other caregiving responsibilities, and her inability to take her mother home to care for her.  We  discussed her caregiver strain and possible resources. She has applied with SNF for medicaid and that is pending. She is going to the DSS to speak with medicaid worker today.   She outlines her own siblings cannot help, and her and her husband's children cannot help. We discussed her self care as part of the patient care.  We discussed what her goals were for her mother's care. She is not able to outline other than she cannot care for her. We discussed her mother's hospital stay and decline in ability for adls. Her mother is now completely dependent in all adls after her bout with covid pneumonia and subsequent decline. She states she is the POA and that she has paperwork to make her mother's decisions but will have to find it. SW from SNF joined [place] and encouraged her to work with DSS and SNF business office to secure care for her mothers.  Follow up Palliative Care Visit: Palliative care will continue to follow for complex medical decision making, advance care planning, and clarification of goals. Return 4 weeks or prn.  I spent  35 minutes providing this consultation. More than 50% of the time in this consultation was spent in counseling and care coordination.   PPS: 30%  HOSPICE ELIGIBILITY/DIAGNOSIS: TBD  Chief Complaint: debility  HISTORY OF PRESENT ILLNESS:  Karen UPPERMAN is a 87 y.o. year old female  with debility following covid pneumonia, cognitive impairment,  debility. Has not made progress from rehab and is now seeking LTC in facility. She requires help with  all adls.  History obtained from review of EMR, discussion with primary team, and interview with family, facility staff/caregiver and/or Ms. Karen Krueger.  I reviewed available labs, medications, imaging, studies and related documents from the EMR.  Records reviewed and summarized above.   ROS/staff  General: NAD Cardiovascular: denies chest pain, denies DOE Pulmonary: denies cough, denies increased SOB Abdomen: endorses fair  appetite, denies constipation, endorses incontinence of bowel GU: denies dysuria, endorses incontinence of urine MSK: endorses  weakness,  no falls reported Skin: denies rashes or wounds Neurological: denies pain, denies insomnia Psych: Endorses positive mood Heme/lymph/immuno: denies bruises, abnormal bleeding  Physical Exam: Current and past weights:103 lbs, stable x 1 month Constitutional: NAD General: frail appearing, thin/ EYES: anicteric sclera, lids intact, no discharge  ENMT: intact hearing, oral mucous membranes dry CV: S1S2, RRR, no LE edema Pulmonary: no increased work of breathing, no cough Abdomen: intake 50%,  no ascites GU: deferred MSK: ++ sarcopenia, moves all extremities, non ambulatory Skin: warm and dry, no rashes or wounds on visible skin Neuro:  ++ generalized weakness,  ++ cognitive impairment Psych: non-anxious affect, A and O x 1 Hem/lymph/immuno: no widespread bruising  Thank you for the opportunity to participate in the care of Ms. Gasiorowski.  The palliative care team will continue to follow. Please call our office at [phone] if we can be of additional assistance.   Jason Coop, NP   COVID-19 PATIENT SCREENING TOOL Asked and negative response unless otherwise noted:   Have you had symptoms of covid, tested positive or been in contact with someone with symptoms/positive test in the past 5-10 days?

## 2021-05-01 ENCOUNTER — Other Ambulatory Visit: Payer: Self-pay

## 2021-05-01 ENCOUNTER — Non-Acute Institutional Stay: Payer: Medicare Other | Admitting: Primary Care

## 2021-05-01 DIAGNOSIS — Z8673 Personal history of transient ischemic attack (TIA), and cerebral infarction without residual deficits: Secondary | ICD-10-CM

## 2021-05-01 DIAGNOSIS — Z515 Encounter for palliative care: Secondary | ICD-10-CM

## 2021-05-01 DIAGNOSIS — F0280 Dementia in other diseases classified elsewhere without behavioral disturbance: Secondary | ICD-10-CM

## 2021-05-01 NOTE — Progress Notes (Signed)
[facility] Palliative Care Consult Note Telephone: [phone]  Fax: [phone]    Date of encounter: 05/01/21 11:30 AM PATIENT NAME: [place] [address]   [phone] (home)  DOB: 02-28-1932 MRN: [id number] PRIMARY CARE PROVIDER:    Idelle Crouch, MD,  [address] [place] [address] [phone]  REFERRING PROVIDER:   Cephas Darby, [address] [place],  [place] [address] [phone]   RESPONSIBLE PARTY:    Contact Information     Name Relation Home Work [place] Daughter [phone]  [phone]       I met face to face with patient [facility]. Palliative Care was asked to follow this patient by consultation request of  Cephas Darby, FNP to address advance care planning and complex medical decision making. This is a follow up visit.                                   ASSESSMENT AND PLAN / RECOMMENDATIONS:   Advance Care Planning/Goals of Care: Goals include to maximize quality of life and symptom management. Our advance care planning conversation included a discussion about:     Exploration of personal, cultural or spiritual beliefs that might influence medical decisions  Exploration of goals of care in the event of a sudden injury or illness  Identification of a healthcare agent = daughter [name] CODE STATUS: DNR Spoke with daughter by phone RE her concerns for patient's pain and fall risk.  Symptom Management/Plan:  Nutrition: Per staff, pt eats about 75% of her meals and pt has been able to maintain her weight around 100 lbs although pt BMI is low at 17. Recommend to staff to add ensure to supplement diet and keep her weight up.   Mobility: Staff denies any recent falls. Pt seems to have decreased mobility; appearing thin and frail. Staff educated on keeping bed low at all times and falls precaution reemphasized.  Daughter  concerned about lower bed for fall prevention, education provided.  Skin Integrity: No skin breakdown seen at this assessment.   Pain: None with CV exam, turning slightly for lung auscultation. Legs were not contracted today. Appears calm and smiling,  PAINAD= 0/10. New on baclofen which seems to be helping with some previous LE pain  Follow up Palliative Care Visit: Palliative care will continue to follow for complex medical decision making, advance care planning, and clarification of goals. Return 6 weeks or prn.  I spent 35 minutes providing this consultation. More than 50% of the time in this consultation was spent in counseling and care coordination.  PPS: 30%  HOSPICE ELIGIBILITY/DIAGNOSIS: TBD  Chief Complaint: debility  HISTORY OF PRESENT ILLNESS:  SEMAJ KHAM is a 87 y.o. year old female  with dementia, leg spasms .   History obtained from review of EMR, discussion with primary team, and interview with family, facility staff/caregiver and/or [name].  I reviewed available labs, medications, imaging, studies and related documents from the EMR.  Records reviewed and summarized above.   ROS/staff   General: NAD Pulmonary: denies cough, denies increased SOB Abdomen: endorses good appetite, denies constipation, endorses incontinence of bowel GU: denies dysuria, endorses incontinence of urine MSK:  endorses weakness,  no falls reported Skin: denies rashes or wounds Neurological: denies pain, denies insomnia Psych: Endorses positive mood Heme/lymph/immuno: denies bruises, abnormal bleeding  Physical Exam: Current and past weights: 100 lbs, 2 lb loss Constitutional:VS HR 87, RR18 General: frail appearing, thin EYES: anicteric sclera, lids intact, no discharge  ENMT: intact hearing, oral mucous membranes moist CV: S1S2, RRR, no LE edema Pulmonary: LCTA, no increased work of breathing, no cough, room air, Po2= 96% ( has Oxygen at bedside) Abdomen: intake 75%, normo-active  BS + 4 quadrants, soft and non tender, no ascites GU: deferred MSK: + sarcopenia, moves all extremities,  non-ambulatory Skin: warm and dry, no rashes or wounds on visible skin Neuro:  + generalized weakness,  + cognitive impairment Psych: non-anxious affect, A and O x 1 Hem/lymph/immuno: no widespread bruising   Thank you for the opportunity to participate in the care of Ms. Biggers.  The palliative care team will continue to follow. Please call our office at [phone] if we can be of additional assistance.   Jason Coop, NP DNP, AGPCNP-BC  COVID-19 PATIENT SCREENING TOOL Asked and negative response unless otherwise noted:   Have you had symptoms of covid, tested positive or been in contact with someone with symptoms/positive test in the past 5-10 days?

## 2021-05-29 ENCOUNTER — Telehealth: Payer: Self-pay | Admitting: Primary Care

## 2021-05-29 ENCOUNTER — Other Ambulatory Visit: Payer: Self-pay

## 2021-05-29 ENCOUNTER — Non-Acute Institutional Stay: Payer: Medicare Other | Admitting: Primary Care

## 2021-05-29 VITALS — Ht 63.0 in | Wt 100.0 lb

## 2021-05-29 DIAGNOSIS — L89159 Pressure ulcer of sacral region, unspecified stage: Secondary | ICD-10-CM | POA: Insufficient documentation

## 2021-05-29 DIAGNOSIS — L89152 Pressure ulcer of sacral region, stage 2: Secondary | ICD-10-CM

## 2021-05-29 DIAGNOSIS — Z515 Encounter for palliative care: Secondary | ICD-10-CM

## 2021-05-29 DIAGNOSIS — R634 Abnormal weight loss: Secondary | ICD-10-CM | POA: Insufficient documentation

## 2021-05-29 DIAGNOSIS — W19XXXS Unspecified fall, sequela: Secondary | ICD-10-CM

## 2021-05-29 NOTE — Progress Notes (Addendum)
[facility] Palliative Care Consult Note Telephone: [phone]  Fax: [phone]    Date of encounter: 05/29/21 11:40 AM PATIENT NAME: Karen Krueger  [id number]   [phone] (home)  DOB: 02-17-1932 MRN: [id number] PRIMARY CARE PROVIDER:    Idelle Crouch, MD,  [address] [place] [address] [phone]  REFERRING PROVIDER:   Aldine Contes, FNP  RESPONSIBLE PARTY:    Contact Information     Name Relation Home Work Mobile   Thaker,Dennis Son   [phone]   Shirline Frees [phone]  [phone]        I met face to face with patient in [facility] facility. Palliative Care was asked to follow this patient by consultation request of  Aldine Contes NP to address advance care planning and complex medical decision making. This is a follow up visit.    ASSESSMENT AND PLAN / RECOMMENDATIONS:   Advance Care Planning/Goals of Care: Goals include to maximize quality of life and symptom management. Our advance care planning conversation included a discussion about:    The value and importance of advance care planning   Exploration of personal, cultural or spiritual beliefs that might influence medical decisions  Exploration of goals of care in the event of a sudden injury or illness  Identification  of a healthcare agent -  Son Simone Curia of an  advance directive document  Decision not to resuscitate or to de-escalate disease focused treatments due to poor prognosis. CODE STATUS DNR I  saw a completed a MOST form today. The patient and family outlined their wishes for the following treatment decisions:  Cardiopulmonary Resuscitation: Do Not Attempt Resuscitation (DNR/No CPR)  Medical Interventions: Comfort Measures: Keep clean, warm, and dry. Use medication by any route, positioning, wound care, and other measures to relieve pain and suffering. Use oxygen, suction  and manual treatment of airway obstruction as needed for comfort. Do not transfer to the hospital unless comfort needs cannot be met in current location.  Antibiotics: Antibiotics if indicated  IV Fluids: IV fluids if indicated  Feeding Tube: Feeding tube for a defined trial period   late entry, 5 pm: Spoke with Collene Schlichter, granddaughter who will sign for pt or discuss with other family. Discussed benefits of hospice.  She will call [facility] hospice office if she needs to reschedule.   Symptom Management/Plan:  Has been able  to sit and interact with people 2 months ago. Now min. responsive, holding food in mouth, 100 % dependent in adls, iadls. Sacral wound 2x 3 cm Pain needs to be addressed, currently on tramadol. Needs roxanol for better PO intake.  Family would like hospice services at [facility]. Family consulted by me and agrees, referral sent.  Follow up Palliative Care Visit: Refer to Hospice.   I spent 35 minutes providing this consultation. More than 50% of the time in this consultation was spent in counseling and care coordination.  PPS: 30% weak   HOSPICE ELIGIBILITY/DIAGNOSIS: TBD  Chief Complaint: weight loss   HISTORY OF PRESENT ILLNESS:  Karen Krueger is a 87 y o w f with old CVA, AD, wt loss, debility .   History obtained from review of EMR, discussion with primary team, and interview with family, facility staff/caregiver and/or [name].  I reviewed available labs, medications, imaging, studies and related documents from the EMR.  Records reviewed and summarized above.   ROS/ staff    General: ill appearing ENMT:  endorses  dysphagia Pulmonary: denies cough, denies increased SOB, oxygen on Abdomen: endorses poor  appetite, denies constipation, endorses incontinence of bowel GU: denies dysuria, endorses incontinence of urine MSK:  endorses weakness,  no falls reported Skin: denies rashes or wounds Neurological: endorses  pain, 5/10 on PAINAD, denies  insomnia Psych: Endorses withdrawn mood Heme/lymph/immuno: denies bruises, abnormal bleeding  Physical Exam: Current and past weights: Body mass index is 17.71 kg/m. Now 100 lbs now, was 110 lbs in 7/22, 10% weight loss Constitutional: 124/70 HR 83 RR 16 Temp 97.0 General: frail appearing, thin EYES: anicteric sclera, lids intact, no discharge  ENMT: intact hearing, oral mucous membranes moist, pouching food  CV: S1S2, RRR, bounding, no LE edema Pulmonary: wheezes scattered, slight  increased work of breathing, no cough, 2 l / [place], 98% po2. Abdomen: intake 50%, no ascites GU: deferred MSK: severe  sarcopenia, functional paraplegia, non  ambulatory Skin: warm and dry, no rashes or wounds on visible skin Neuro:  ++ generalized weakness,  severe cognitive impairment Psych: non-anxious affect, min responsive Hem/lymph/immuno: no widespread bruising  Thank you for the opportunity to participate in the care of Ms. Burnette.  The palliative care team will continue to follow. Please call our office at [phone] if we can be of additional assistance.   Jason Coop, NP   COVID-19 PATIENT SCREENING TOOL Asked and negative response unless otherwise noted:   Have you had symptoms of covid, tested positive or been in contact with someone with symptoms/positive test in the past 5-10 days?

## 2021-05-29 NOTE — Telephone Encounter (Signed)
Call to [facility], who will take appt for hospice tomorrow at [facility].

## 2021-05-30 ENCOUNTER — Other Ambulatory Visit: Payer: Self-pay

## 2021-05-30 ENCOUNTER — Non-Acute Institutional Stay: Payer: Medicare Other | Admitting: Primary Care

## 2021-05-30 DIAGNOSIS — G309 Alzheimer's disease, unspecified: Secondary | ICD-10-CM

## 2021-05-30 DIAGNOSIS — F0280 Dementia in other diseases classified elsewhere without behavioral disturbance: Secondary | ICD-10-CM

## 2021-05-30 DIAGNOSIS — R634 Abnormal weight loss: Secondary | ICD-10-CM

## 2021-05-30 DIAGNOSIS — Z515 Encounter for palliative care: Secondary | ICD-10-CM

## 2021-05-30 NOTE — Progress Notes (Signed)
    [facility] Pall[facility] Consult Note Telephone: [phone]  Fax: [phone]    Date of encounter: 05/30/21 PATIENT NAME: Karen Krueger [place] [id number]   [phone] (home)  DOB: 10-09-1931 MRN: [id number] PRIMARY CARE PROVIDER:    Idelle Crouch, MD,  [address] [place] [address] [phone]  REFERRING PROVIDER:   Aldine Contes NP  RESPONSIBLE PARTY:    Contact Information     Name Relation Home Work Mobile   Messina,Dennis Son   [phone]   Shirline Frees [phone]  [phone]       I met face to face with patient and family in [facility] facility. Palliative Care was asked to follow this patient by consultation request of  Aldine Contes NP to address advance care planning.                                   ASSESSMENT AND PLAN / RECOMMENDATIONS:   Advance Care Planning/Goals of Care: Goals include to maximize quality of life and symptom management. Our advance care planning conversation included a discussion about:    The value and importance of advance care planning  Experiences with loved ones who have been seriously ill or have died  Exploration of personal, cultural or spiritual beliefs that might influence medical decisions  Exploration of goals of care in the event of a sudden injury or illness  Identification of a healthcare agent - son is available now that daughter is ill Review of an  advance directive document . Decision  to de-escalate disease focused treatments due to poor prognosis. CODE STATUS: DNR  Follow up Palliative Care Visit: Refer to hospice .  I spent 30 minutes providing this consultation. More than 50% of the time in this consultation was spent in counseling and care coordination.  Thank you for the opportunity to participate in the care of Ms. Fulgham.  The palliative care team will continue to follow. Please call our office at  [phone] if we can be of additional assistance.   Jason Coop, NP , DNP, MPH, AGPCNP-BC, ACHPN  COVID-19 PATIENT SCREENING TOOL Asked and negative response unless otherwise noted:   Have you had symptoms of covid, tested positive or been in contact with someone with symptoms/positive test in the past 5-10 days?

## 2021-06-04 ENCOUNTER — Telehealth: Payer: Self-pay

## 2021-06-04 NOTE — Telephone Encounter (Signed)
Returned message to son. Patient is now under Hospice care. Son expressed concerns about how much medication his mom is getting. Hospice RN messaged to follow up.

## 2021-08-22 DEATH — deceased
